# Patient Record
Sex: Female | Born: 1955 | Race: White | Hispanic: No | Marital: Married | Smoking: Former smoker
Health system: Southern US, Community
[De-identification: ages and names within clinical notes are randomized; demographics above are authoritative.]

## PROBLEM LIST (undated history)

## (undated) DIAGNOSIS — E669 Obesity, unspecified: Secondary | ICD-10-CM

## (undated) DIAGNOSIS — J449 Chronic obstructive pulmonary disease, unspecified: Secondary | ICD-10-CM

## (undated) DIAGNOSIS — D696 Thrombocytopenia, unspecified: Secondary | ICD-10-CM

## (undated) DIAGNOSIS — K08109 Complete loss of teeth, unspecified cause, unspecified class: Secondary | ICD-10-CM

## (undated) DIAGNOSIS — D751 Secondary polycythemia: Secondary | ICD-10-CM

## (undated) DIAGNOSIS — Z720 Tobacco use: Secondary | ICD-10-CM

## (undated) DIAGNOSIS — K5720 Diverticulitis of large intestine with perforation and abscess without bleeding: Secondary | ICD-10-CM

## (undated) DIAGNOSIS — E119 Type 2 diabetes mellitus without complications: Secondary | ICD-10-CM

## (undated) HISTORY — PX: OTHER SURGICAL HISTORY: SHX169

## (undated) HISTORY — PX: TONSILLECTOMY: SUR1361

---

## 2003-11-22 ENCOUNTER — Ambulatory Visit (HOSPITAL_COMMUNITY): Admission: RE | Admit: 2003-11-22 | Discharge: 2003-11-22 | Payer: Self-pay | Admitting: Orthopedic Surgery

## 2005-05-18 IMAGING — CR DG CHEST 2V
2 series · 2 of 2 positions shown · non-contrast
Comparison: none

CLINICAL DATA: Index finger laceration.  Pre-operative chest. 
 TWO VIEW CHEST ? 11/21/03
 The lungs are clear and the heart and mediastinal structures are normal.  
 IMPRESSION
 No evidence for active chest disease.

[view not recorded (1 of 2)]
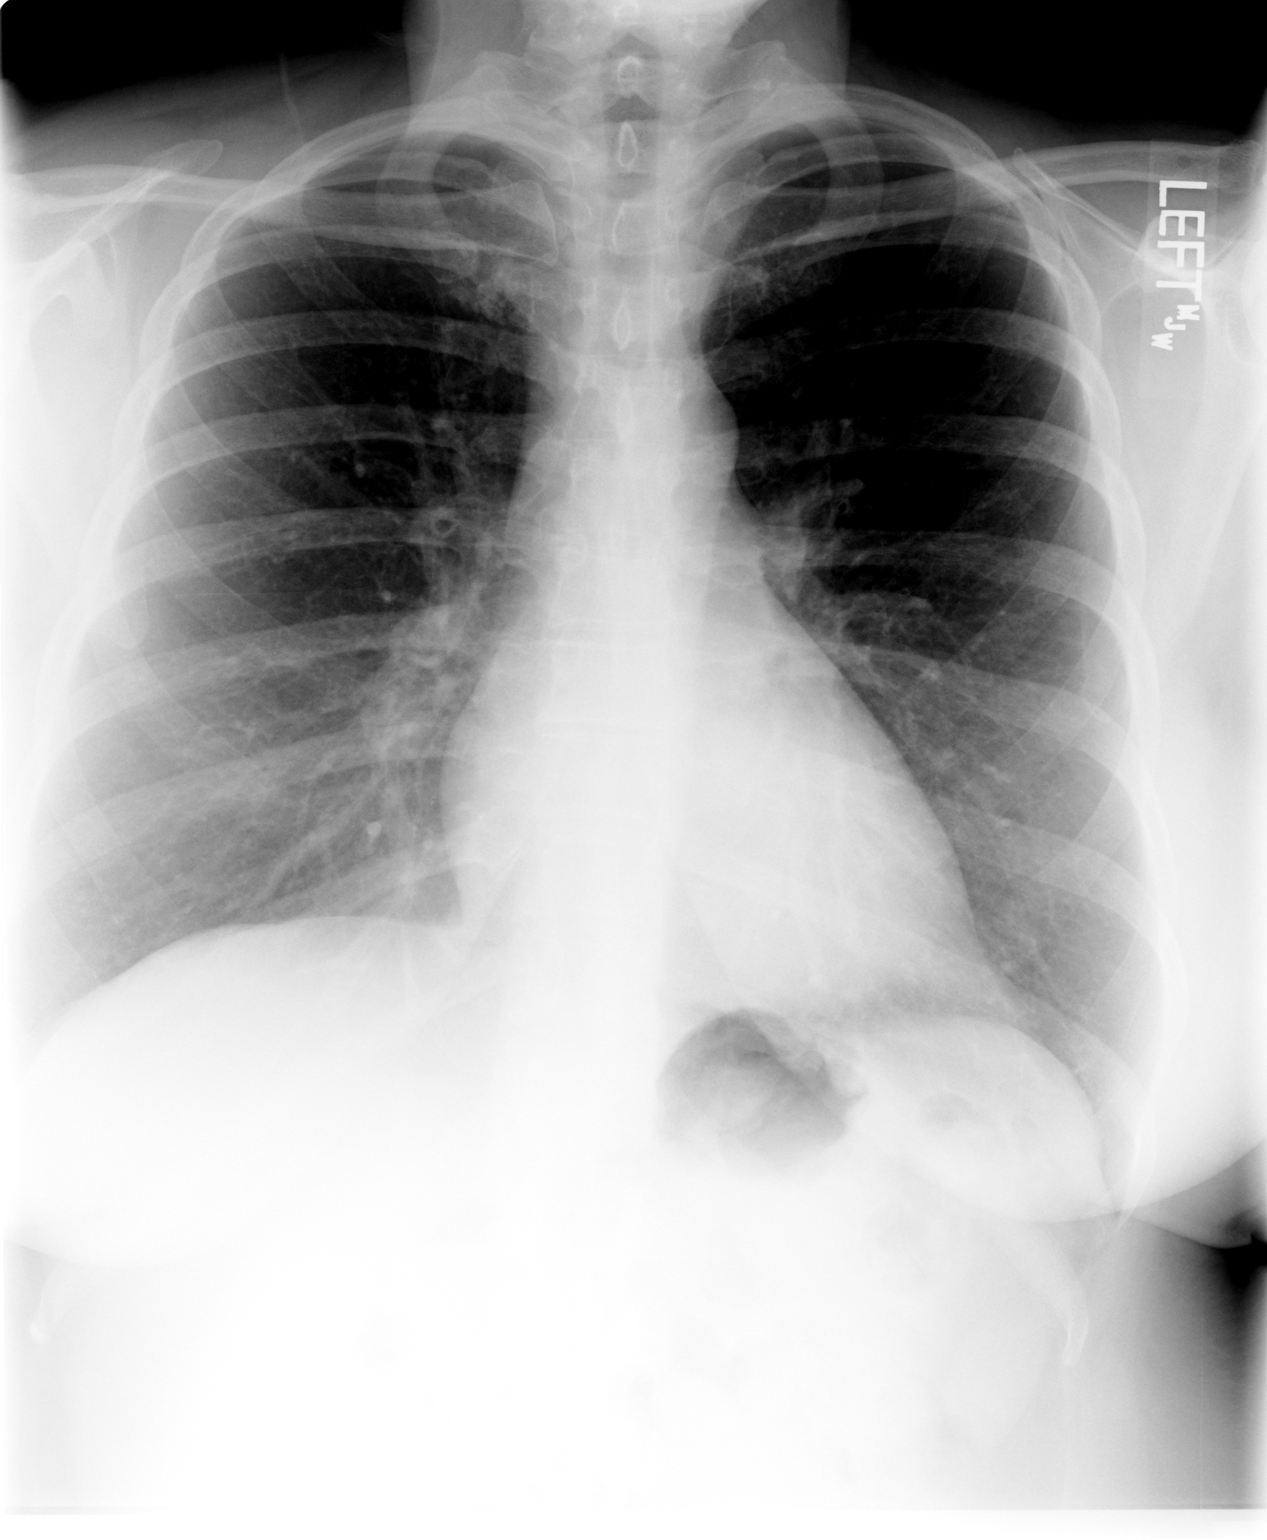

[view not recorded (2 of 2)]
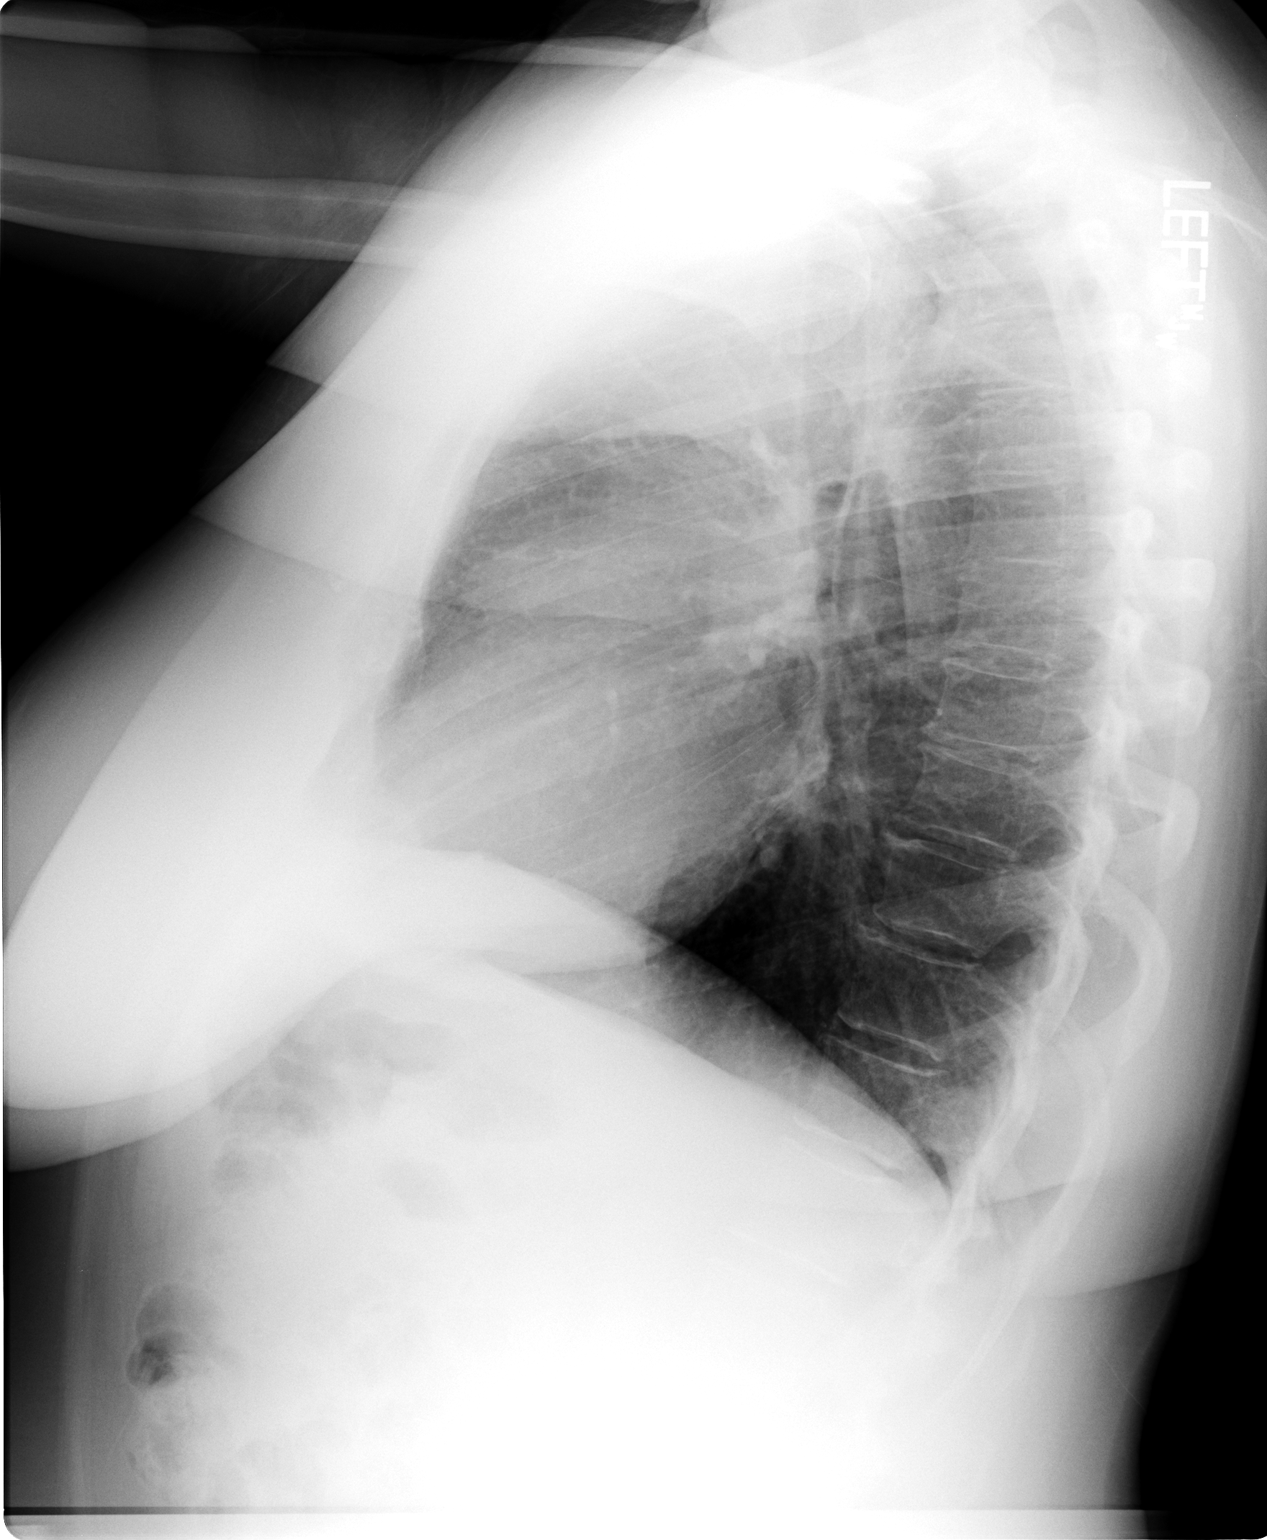

[2 of 2 positions shown; findings below may reference images not displayed]

## 2019-12-18 ENCOUNTER — Encounter: Payer: Self-pay | Admitting: Emergency Medicine

## 2019-12-18 ENCOUNTER — Ambulatory Visit (INDEPENDENT_AMBULATORY_CARE_PROVIDER_SITE_OTHER): Payer: BC Managed Care – PPO

## 2019-12-18 ENCOUNTER — Ambulatory Visit
Admission: EM | Admit: 2019-12-18 | Discharge: 2019-12-18 | Disposition: A | Discharge status: Home / Self Care | Payer: BC Managed Care – PPO | Attending: Family Medicine | Admitting: Family Medicine

## 2019-12-18 ENCOUNTER — Other Ambulatory Visit: Payer: Self-pay

## 2019-12-18 DIAGNOSIS — S90122A Contusion of left lesser toe(s) without damage to nail, initial encounter: Secondary | ICD-10-CM

## 2019-12-18 MED ORDER — NAPROXEN 500 MG PO TABS: 500.0000 mg | ORAL_TABLET | Freq: Two times a day (BID) | ORAL | 0 refills | Status: DC | PRN

## 2019-12-18 NOTE — Discharge Instructions (Signed)
Rest, ice.  Medication as needed for pain.  Take care  Dr. Adriana Simas

## 2019-12-18 NOTE — ED Triage Notes (Signed)
Patient c/o redness, swelling and pain in her left 5th toe for the past 3-4 days.  Patient denies fevers.

## 2019-12-18 NOTE — ED Provider Notes (Signed)
[facility]    CSN: [id number] Arrival date & time: 12/18/19  2355      History   Chief Complaint Chief Complaint  Patient presents with  . Toe Pain   HPI  64 year old female presents with pain and swelling of her left fifth toe.  Patient reports pain for the past 3 to 4 days.  She states that prior to the development of pain she trimmed her toenail.  Patient dorsums redness, swelling, and pain.  Patient is concerned that she has an infection of the toe.  No fever.  No relieving factors.  The area is quite painful.  Pain 7/10 in severity.  Worse with pressure to the affected area.  No other complaints.  PMH: Tobacco abuse  Past Surgical History:  Procedure Laterality Date  . CESAREAN SECTION    . TONSILLECTOMY      OB History   No obstetric history on file.      Home Medications    Prior to Admission medications   Medication Sig Start Date End Date Taking? Authorizing Provider  naproxen (NAPROSYN) 500 MG tablet Take 1 tablet (500 mg total) by mouth 2 (two) times daily as needed for mild pain or moderate pain. 12/18/19   Coral Spikes, DO    Family History Family History  Problem Relation Age of Onset  . Asthma Mother   . Heart attack Mother   . Heart attack Father     Social History Social History   Tobacco Use  . Smoking status: Current Every Day Smoker    Types: Cigarettes  . Smokeless tobacco: Never Used  Substance Use Topics  . Alcohol use: Not Currently  . Drug use: Never     Allergies   Patient has no known allergies.   Review of Systems Review of Systems  Constitutional: Negative.   Musculoskeletal:       Left 5th toe pain.   Physical Exam Triage Vital Signs ED Triage Vitals  Enc Vitals Group     BP 12/18/19 0917 108/80     Pulse Rate 12/18/19 0917 87     Resp 12/18/19 0917 14     Temp 12/18/19 0917 97.9 F (36.6 C)     Temp Source 12/18/19 0917 Oral     SpO2 12/18/19 0917 93 %     Weight 12/18/19 0914 215 lb (97.5  kg)     Height 12/18/19 0914 5\' 3"  (1.6 m)     Head Circumference --      Peak Flow --      Pain Score 12/18/19 0914 7     Pain Loc --      Pain Edu? --      Excl. in [place]? --    Updated Vital Signs BP 108/80 (BP Location: Left Arm)   Pulse 87   Temp 97.9 F (36.6 C) (Oral)   Resp 14   Ht 5\' 3"  (1.6 m)   Wt 97.5 kg   SpO2 93%   BMI 38.09 kg/m   Visual Acuity Right Eye Distance:   Left Eye Distance:   Bilateral Distance:    Right Eye Near:   Left Eye Near:    Bilateral Near:     Physical Exam Vitals and nursing note reviewed.  Constitutional:      General: She is not in acute distress.    Appearance: Normal appearance. She is obese. She is not ill-appearing.  HENT:     Head: Normocephalic and atraumatic.  Eyes:  General:        Right eye: No discharge.        Left eye: No discharge.     Conjunctiva/sclera: Conjunctivae normal.  Cardiovascular:     Pulses:          Dorsalis pedis pulses are detected w/ Doppler on the left side.  Pulmonary:     Effort: Pulmonary effort is normal. No respiratory distress.  Feet:     Left foot:     Skin integrity: Skin integrity normal.     Comments: Left fifth toe with extensive bruising on the plantar aspect.  Tender to palpation.  No appreciable erythema or warmth. Neurological:     Mental Status: She is alert.  Psychiatric:        Mood and Affect: Mood normal.        Behavior: Behavior normal.    UC Treatments / Results  Labs (all labs ordered are listed, but only abnormal results are displayed) Labs Reviewed - No data to display  EKG   Radiology DG Toe 5th Left  Result Date: 12/18/2019 CLINICAL DATA:  Pain with swelling and redness EXAM: DG TOE 5TH LEFT: 3 V COMPARISON:  None. FINDINGS: Frontal, oblique, and lateral views obtained. No fracture or dislocation. Joint spaces appear normal. No erosive change. IMPRESSION: No fracture or dislocation.  No evident arthropathy. Electronically Signed   By: Bretta Bang  III M.D.   On: 12/18/2019 10:07    Procedures Procedures (including critical care time)  Medications Ordered in UC Medications - No data to display  Initial Impression / Assessment and Plan / UC Course  I have reviewed the triage vital signs and the nursing notes.  Pertinent labs & imaging results that were available during my care of the patient were reviewed by me and considered in my medical decision making (see chart for details).    64 year old female presents with contusion of her left fifth toe.  X-ray obtained and interpreted independently by me.  Interpretation: No acute fracture.  Advised rest, ice.  Naproxen as directed.   Final Clinical Impressions(s) / UC Diagnoses   Final diagnoses:  Contusion of lesser toe of left foot without damage to nail, initial encounter     Discharge Instructions     Rest, ice.  Medication as needed for pain.  Take care  Dr. Adriana Simas    ED Prescriptions    Medication Sig Dispense Auth. Provider   naproxen (NAPROSYN) 500 MG tablet Take 1 tablet (500 mg total) by mouth 2 (two) times daily as needed for mild pain or moderate pain. 30 tablet Tommie Sams, DO     PDMP not reviewed this encounter.   Tommie Sams, [place] 12/18/19 [phone]

## 2021-06-14 IMAGING — CR DG TOE 5TH 2+V*L*
3 series · 3 of 3 positions shown · non-contrast
Comparison: None.

CLINICAL DATA: Pain with swelling and redness

EXAM:
DG TOE 5TH LEFT: 3 V

[toe ap]
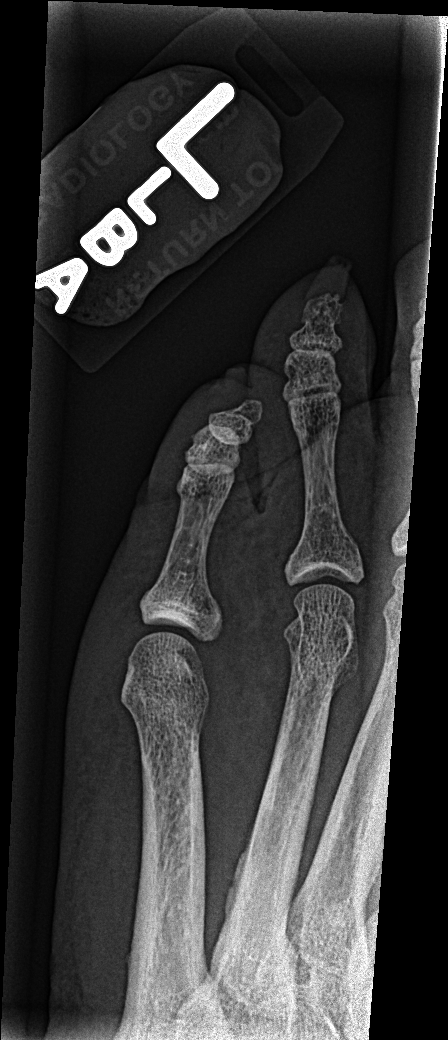

[toe obl]
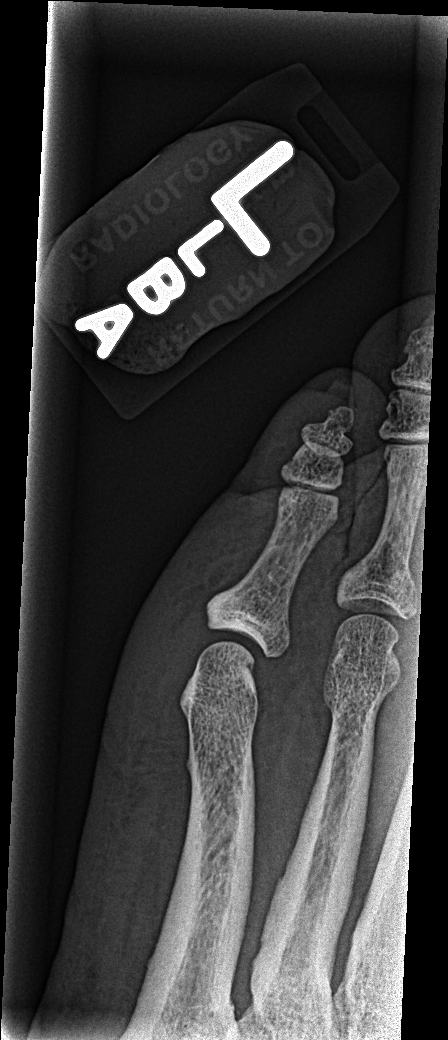

[toe lat]
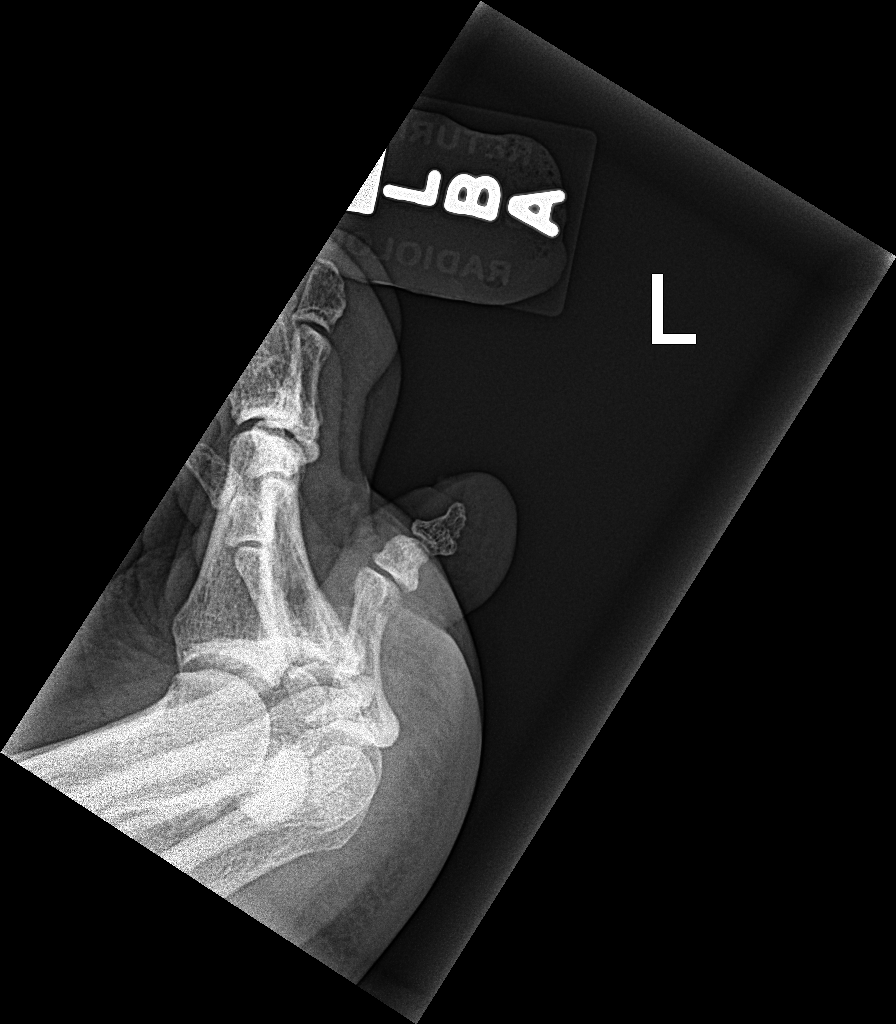

[3 of 3 positions shown; findings below may reference images not displayed]

FINDINGS: Frontal, oblique, and lateral views obtained. No fracture or
dislocation. Joint spaces appear normal. No erosive change.
IMPRESSION: No fracture or dislocation.  No evident arthropathy.

## 2021-09-22 ENCOUNTER — Encounter: Payer: Self-pay | Admitting: Emergency Medicine

## 2021-09-22 ENCOUNTER — Inpatient Hospital Stay
Admission: EM | Admit: 2021-09-22 | Discharge: 2021-09-24 | DRG: 190 | Disposition: A | Discharge status: Home / Self Care | Payer: BLUE CROSS/BLUE SHIELD | Attending: Internal Medicine | Admitting: Internal Medicine

## 2021-09-22 ENCOUNTER — Emergency Department: Payer: Self-pay

## 2021-09-22 ENCOUNTER — Other Ambulatory Visit: Payer: Self-pay

## 2021-09-22 DIAGNOSIS — Z6835 Body mass index (BMI) 35.0-35.9, adult: Secondary | ICD-10-CM

## 2021-09-22 DIAGNOSIS — J9602 Acute respiratory failure with hypercapnia: Secondary | ICD-10-CM | POA: Diagnosis present

## 2021-09-22 DIAGNOSIS — D751 Secondary polycythemia: Secondary | ICD-10-CM | POA: Diagnosis present

## 2021-09-22 DIAGNOSIS — Z8249 Family history of ischemic heart disease and other diseases of the circulatory system: Secondary | ICD-10-CM

## 2021-09-22 DIAGNOSIS — Z5989 Other problems related to housing and economic circumstances: Secondary | ICD-10-CM

## 2021-09-22 DIAGNOSIS — Z720 Tobacco use: Secondary | ICD-10-CM | POA: Diagnosis present

## 2021-09-22 DIAGNOSIS — F17210 Nicotine dependence, cigarettes, uncomplicated: Secondary | ICD-10-CM | POA: Diagnosis present

## 2021-09-22 DIAGNOSIS — R0602 Shortness of breath: Secondary | ICD-10-CM | POA: Diagnosis present

## 2021-09-22 DIAGNOSIS — Z5971 Insufficient health insurance coverage: Secondary | ICD-10-CM

## 2021-09-22 DIAGNOSIS — Z825 Family history of asthma and other chronic lower respiratory diseases: Secondary | ICD-10-CM

## 2021-09-22 DIAGNOSIS — B9789 Other viral agents as the cause of diseases classified elsewhere: Secondary | ICD-10-CM | POA: Diagnosis present

## 2021-09-22 DIAGNOSIS — E669 Obesity, unspecified: Secondary | ICD-10-CM

## 2021-09-22 DIAGNOSIS — J441 Chronic obstructive pulmonary disease with (acute) exacerbation: Secondary | ICD-10-CM | POA: Diagnosis not present

## 2021-09-22 DIAGNOSIS — Z20822 Contact with and (suspected) exposure to covid-19: Secondary | ICD-10-CM | POA: Diagnosis present

## 2021-09-22 DIAGNOSIS — J9601 Acute respiratory failure with hypoxia: Secondary | ICD-10-CM

## 2021-09-22 DIAGNOSIS — D696 Thrombocytopenia, unspecified: Secondary | ICD-10-CM | POA: Diagnosis present

## 2021-09-22 DIAGNOSIS — J449 Chronic obstructive pulmonary disease, unspecified: Secondary | ICD-10-CM | POA: Diagnosis present

## 2021-09-22 HISTORY — DX: Obesity, unspecified: E66.9

## 2021-09-22 HISTORY — DX: Tobacco use: Z72.0

## 2021-09-22 LAB — CBC WITH DIFFERENTIAL/PLATELET
Abs Immature Granulocytes: 0.03 10*3/uL (ref 0.00–0.07)
Basophils Absolute: 0 10*3/uL (ref 0.0–0.1)
Basophils Relative: 1 %
Eosinophils Absolute: 0.1 10*3/uL (ref 0.0–0.5)
Eosinophils Relative: 2 %
HCT: 49.6 % — ABNORMAL HIGH (ref 36.0–46.0)
Hemoglobin: 15.3 g/dL — ABNORMAL HIGH (ref 12.0–15.0)
Immature Granulocytes: 0 %
Lymphocytes Relative: 16 %
Lymphs Abs: 1.4 10*3/uL (ref 0.7–4.0)
MCH: 28.9 pg (ref 26.0–34.0)
MCHC: 30.8 g/dL (ref 30.0–36.0)
MCV: 93.6 fL (ref 80.0–100.0)
Monocytes Absolute: 0.6 10*3/uL (ref 0.1–1.0)
Monocytes Relative: 7 %
Neutro Abs: 6.2 10*3/uL (ref 1.7–7.7)
Neutrophils Relative %: 74 %
Platelets: 149 10*3/uL — ABNORMAL LOW (ref 150–400)
RBC: 5.3 MIL/uL — ABNORMAL HIGH (ref 3.87–5.11)
RDW: 14.6 % (ref 11.5–15.5)
WBC: 8.3 10*3/uL (ref 4.0–10.5)
nRBC: 0 % (ref 0.0–0.2)

## 2021-09-22 LAB — RESP PANEL BY RT-PCR (FLU A&B, COVID) ARPGX2
Influenza A by PCR: NEGATIVE
Influenza B by PCR: NEGATIVE
SARS Coronavirus 2 by RT PCR: NEGATIVE

## 2021-09-22 LAB — BLOOD GAS, VENOUS
Acid-Base Excess: 7 mmol/L — ABNORMAL HIGH (ref 0.0–2.0)
Bicarbonate: 35.3 mmol/L — ABNORMAL HIGH (ref 20.0–28.0)
O2 Saturation: 84.5 %
Patient temperature: 37
pCO2, Ven: 64 mmHg — ABNORMAL HIGH (ref 44.0–60.0)
pH, Ven: 7.35 (ref 7.250–7.430)
pO2, Ven: 52 mmHg — ABNORMAL HIGH (ref 32.0–45.0)

## 2021-09-22 LAB — TROPONIN I (HIGH SENSITIVITY): Troponin I (High Sensitivity): 14 ng/L (ref <18)

## 2021-09-22 LAB — PROTIME-INR
INR: 1 (ref 0.8–1.2)
Prothrombin Time: 13.2 seconds (ref 11.4–15.2)

## 2021-09-22 LAB — BASIC METABOLIC PANEL WITH GFR
Anion gap: 7 (ref 5–15)
BUN: 10 mg/dL (ref 8–23)
CO2: 30 mmol/L (ref 22–32)
Calcium: 8.3 mg/dL — ABNORMAL LOW (ref 8.9–10.3)
Chloride: 98 mmol/L (ref 98–111)
Creatinine, Ser: 0.71 mg/dL (ref 0.44–1.00)
GFR, Estimated: >60 mL/min
Glucose, Bld: 149 mg/dL — ABNORMAL HIGH (ref 70–99)
Potassium: 4.8 mmol/L (ref 3.5–5.1)
Sodium: 135 mmol/L (ref 135–145)

## 2021-09-22 LAB — D-DIMER, QUANTITATIVE: D-Dimer, Quant: 1.44 ug/mL-FEU — ABNORMAL HIGH (ref 0.00–0.50)

## 2021-09-22 LAB — BRAIN NATRIURETIC PEPTIDE: B Natriuretic Peptide: 139.4 pg/mL — ABNORMAL HIGH (ref 0.0–100.0)

## 2021-09-22 MED ORDER — SODIUM CHLORIDE 0.9% FLUSH
3.0000 mL | Freq: Two times a day (BID) | INTRAVENOUS | Status: DC
  Administered 2021-09-22 – 2021-09-24 (×4): 3 mL via INTRAVENOUS

## 2021-09-22 MED ORDER — PREDNISONE 20 MG PO TABS
40.0000 mg | ORAL_TABLET | Freq: Every day | ORAL | Status: DC
  Administered 2021-09-23 – 2021-09-24 (×2): 40 mg via ORAL
  Filled 2021-09-22 (×2): qty 2

## 2021-09-22 MED ORDER — METHYLPREDNISOLONE SODIUM SUCC 125 MG IJ SOLR
125.0000 mg | INTRAMUSCULAR | Status: AC
  Administered 2021-09-22: 125 mg via INTRAVENOUS
  Filled 2021-09-22: qty 2

## 2021-09-22 MED ORDER — LORATADINE 10 MG PO TABS: 10.0000 mg | ORAL_TABLET | Freq: Every day | ORAL | Status: DC | PRN

## 2021-09-22 MED ORDER — SODIUM CHLORIDE 0.9 % IV BOLUS
500.0000 mL | Freq: Once | INTRAVENOUS | Status: AC
  Administered 2021-09-22: 500 mL via INTRAVENOUS

## 2021-09-22 MED ORDER — ALBUTEROL SULFATE (2.5 MG/3ML) 0.083% IN NEBU: 2.5000 mg | INHALATION_SOLUTION | RESPIRATORY_TRACT | Status: DC | PRN

## 2021-09-22 MED ORDER — GUAIFENESIN ER 600 MG PO TB12
600.0000 mg | ORAL_TABLET | Freq: Two times a day (BID) | ORAL | Status: DC
  Administered 2021-09-22 – 2021-09-24 (×4): 600 mg via ORAL
  Filled 2021-09-22 (×3): qty 1

## 2021-09-22 MED ORDER — LEVOFLOXACIN IN D5W 750 MG/150ML IV SOLN
750.0000 mg | Freq: Once | INTRAVENOUS | Status: DC
  Administered 2021-09-22: 750 mg via INTRAVENOUS
  Filled 2021-09-22: qty 150

## 2021-09-22 MED ORDER — SODIUM CHLORIDE 0.9 % IV SOLN
500.0000 mg | INTRAVENOUS | Status: DC
  Filled 2021-09-22: qty 5

## 2021-09-22 MED ORDER — IPRATROPIUM-ALBUTEROL 0.5-2.5 (3) MG/3ML IN SOLN
3.0000 mL | Freq: Four times a day (QID) | RESPIRATORY_TRACT | Status: DC
  Administered 2021-09-22 – 2021-09-23 (×4): 3 mL via RESPIRATORY_TRACT
  Filled 2021-09-22 (×4): qty 3

## 2021-09-22 MED ORDER — IPRATROPIUM-ALBUTEROL 0.5-2.5 (3) MG/3ML IN SOLN
9.0000 mL | Freq: Once | RESPIRATORY_TRACT | Status: AC
  Administered 2021-09-22: 9 mL via RESPIRATORY_TRACT
  Filled 2021-09-22: qty 9

## 2021-09-22 MED ORDER — AZITHROMYCIN 250 MG PO TABS: 500.0000 mg | ORAL_TABLET | Freq: Every day | ORAL | Status: DC

## 2021-09-22 NOTE — Plan of Care (Signed)

## 2021-09-22 NOTE — Assessment & Plan Note (Addendum)
Patient reports that she has at least a 50 smoking pack-year history.  For the last week she has not smoked any cigarettes and declines need of a nicotine patch. -Encouraged continued cessation of tobacco use -Patient likely needs to have a screening CT scan of the chest given her history.

## 2021-09-22 NOTE — Assessment & Plan Note (Addendum)
Patient presented with complaints of shortness of breath and was found to be hypoxic 70% on room air requiring placement to a nonrebreather with improvement in O2 saturations greater than 92%.  Venous blood gas noted hypercapnia with PCO2 of  64.  Patient has been given Solu-Medrol 125 mg IV and DuoNeb breathing treatments with improvement in symptoms and was able to be weaned down to 6 L with O2 saturations maintained.  Chest x-ray had not shown any significant abnormality.  Given her history of tobacco abuse with suspect the patient likely has COPD although not formally diagnosed. -Admit to a progressive bed -COPD order set utilized -Continuous pulse oximetry with nasal cannula oxygen maintain O2 saturation greater than 92% -Check D-dimer, but lower suspicion for pulmonary embolus at this time. -Breathing treatments scheduled and as needed -Empiric antibiotics of azithromycin -Transition to prednisone 40 mg daily in a.m., but may need to continue with IV Solu-Medrol depending on how the patient is doing clinically

## 2021-09-22 NOTE — Assessment & Plan Note (Signed)
-  Transitions of care consulted

## 2021-09-22 NOTE — ED Triage Notes (Signed)
Pt via POV from home. Pt states she has been SOB for the past 2 days. States she has been getting over a common cold. Denies hx of COPD/CHF but is a smoker. Denies pain. N arrival, pt 70% on RA. Pt placed on NRB at this time 15L increased to 95%.   Pt roomed to 16. Dr. Joni Fears at bedside

## 2021-09-22 NOTE — Assessment & Plan Note (Addendum)
On admission hemoglobin 15.3.  Suspect related with patient's long history of tobacco abuse.

## 2021-09-22 NOTE — ED Provider Notes (Signed)
[facility] Provider Note    Event Date/Time   First MD Initiated Contact with Patient 09/22/21 1443     (approximate)   History   Respiratory Distress   HPI  Yolanda Gray is a 66 y.o. female with no known medical history who comes ED complaining of shortness of breath for the past 2 days, gradual onset and worsening.  At triage she was noted to have a room air oxygen saturation of 70%, placed on nonrebreather and brought back to treatment room.  Patient states that she was in her usual state of health up until a week ago when she had nonproductive cough, nasal congestion, headache and body aches and fatigue.  Many of those symptoms have now resolved, but this was followed by the worsening shortness of breath.  Denies chest pain.     Physical Exam   Triage Vital Signs: ED Triage Vitals  Enc Vitals Group     BP 09/22/21 1442 (!) 157/70     Pulse Rate 09/22/21 1437 (!) 115     Resp 09/22/21 1437 (!) 24     Temp 09/22/21 1437 98.4 F (36.9 C)     Temp Source 09/22/21 1437 Oral     SpO2 09/22/21 1437 (!) 70 %     Weight 09/22/21 1438 200 lb (90.7 kg)     Height 09/22/21 1438 5\' 3"  (1.6 m)     Head Circumference --      Peak Flow --      Pain Score 09/22/21 1438 0     Pain Loc --      Pain Edu? --      Excl. in [place]? --     Most recent vital signs: Vitals:   09/22/21 1500 09/22/21 1530  BP: (!) 119/55 (!) 114/48  Pulse: (!) 107 93  Resp: (!) 22 19  Temp:    SpO2: 99% 99%     General: Awake, no distress.  Mild respiratory distress CV:  Good peripheral perfusion.  Tachycardia heart rate 110 Resp:  Normal effort.  Tachypnea, respiratory rate of 24.  Diffuse expiratory wheezing with prolonged expiratory phase.  FEV1 maneuver accentuates wheezing and provokes coughing. Abd:  No distention.  Soft nontender Other:  No lower extremity edema.   ED Results / Procedures / Treatments   Labs (all labs ordered are listed, but only abnormal results  are displayed) Labs Reviewed  BASIC METABOLIC PANEL - Abnormal; Notable for the following components:      Result Value   Glucose, Bld 149 (*)    Calcium 8.3 (*)    All other components within normal limits  BRAIN NATRIURETIC PEPTIDE - Abnormal; Notable for the following components:   B Natriuretic Peptide 139.4 (*)    All other components within normal limits  CBC WITH DIFFERENTIAL/PLATELET - Abnormal; Notable for the following components:   RBC 5.30 (*)    Hemoglobin 15.3 (*)    HCT 49.6 (*)    Platelets 149 (*)    All other components within normal limits  BLOOD GAS, VENOUS - Abnormal; Notable for the following components:   pCO2, Ven 64 (*)    pO2, Ven 52.0 (*)    Bicarbonate 35.3 (*)    Acid-Base Excess 7.0 (*)    All other components within normal limits  RESP PANEL BY RT-PCR (FLU A&B, COVID) ARPGX2  PROTIME-INR  TROPONIN I (HIGH SENSITIVITY)     EKG Interpreted by me Sinus tachycardia rate 107.  Normal axis and intervals.  Normal QRS ST segments and T waves    RADIOLOGY Chest x-ray viewed and interpreted by me, appears unremarkable without pulmonary edema or infiltrate.  Radiology report reviewed    PROCEDURES:  Critical Care performed: Yes, see critical care procedure note(s)  .1-3 Lead EKG Interpretation Performed by: Sharman Cheek, MD Authorized by: Sharman Cheek, MD     Interpretation: abnormal     ECG rate:  110   ECG rate assessment: tachycardic     Rhythm: sinus rhythm     Ectopy: none     Conduction: normal   Comments:        .Critical Care Performed by: Sharman Cheek, MD Authorized by: Sharman Cheek, MD   Critical care provider statement:    Critical care time (minutes):  32   Critical care time was exclusive of:  Separately billable procedures and treating other patients   Critical care was necessary to treat or prevent imminent or life-threatening deterioration of the following conditions:  Respiratory failure    Critical care was time spent personally by me on the following activities:  Development of treatment plan with patient or surrogate, discussions with consultants, evaluation of patient's response to treatment, examination of patient, obtaining history from patient or surrogate, ordering and performing treatments and interventions, ordering and review of laboratory studies, ordering and review of radiographic studies, pulse oximetry, re-evaluation of patient's condition and review of old charts   MEDICATIONS ORDERED IN ED: Medications  levofloxacin (LEVAQUIN) IVPB 750 mg (750 mg Intravenous New Bag/Given 09/22/21 1511)  sodium chloride 0.9 % bolus 500 mL (500 mLs Intravenous New Bag/Given 09/22/21 1501)  methylPREDNISolone sodium succinate (SOLU-MEDROL) 125 mg/2 mL injection 125 mg (125 mg Intravenous Given 09/22/21 1506)  ipratropium-albuterol (DUONEB) 0.5-2.5 (3) MG/3ML nebulizer solution 9 mL (9 mLs Nebulization Given 09/22/21 1512)     IMPRESSION / MDM / ASSESSMENT AND PLAN / ED COURSE  I reviewed the triage vital signs and the nursing notes.                              Differential diagnosis includes, but is not limited to, bronchospasm/exacerbation of undiagnosed COPD, acute pulmonary edema, pneumonia, viral illness  The patient is on the cardiac monitor to evaluate for evidence of arrhythmia and/or significant heart rate changes.**}  Patient presents with shortness of breath, found to be in acute respiratory failure with hypoxia.  She is requiring nonrebreather oxygen to maintain oxygen saturation in the mid 90s.  She does feel better on nonrebreather.  No chest pain currently.  She is a daily smoker.  Given her pronounced wheezing and prolonged expiratory phase on exam, I think that most likely this is a COPD exacerbation even though she does not have a known diagnosis of COPD.  Doubt ACS PE or dissection.  Will give Solu-Medrol, bronchodilators, fluids, check labs chest x-ray and COVID  test.  Will most likely require admission for further management unless work-up is reassuring and hypoxia resolves after initial medications. Clinical Course as of 09/22/21 1556  Sat Sep 22, 2021  1507 SpO2(!): 70 % [KP]    Clinical Course User Index [KP] Minna Antis, MD    ----------------------------------------- 3:56 PM on 09/22/2021 ----------------------------------------- After nebs, titrated down to 6L [place]. Cxr clear. Will plan to admit.    FINAL CLINICAL IMPRESSION(S) / ED DIAGNOSES   Final diagnoses:  Acute respiratory failure with hypoxia (HCC)     Rx /  DC Orders   ED Discharge Orders     None        Note:  This document was prepared using Dragon voice recognition software and may include unintentional dictation errors.   Sharman Cheek, MD 09/22/21 [phone]

## 2021-09-22 NOTE — H&P (Signed)
History and Physical    Patient: Yolanda Gray RAX:[id number] DOB: 1955-12-16 DOA: 09/22/2021 DOS: the patient was seen and examined on 09/22/2021 PCP: Pcp, No  Patient coming from: Home via private vehicle  Chief Complaint:  Chief Complaint  Patient presents with   Respiratory Distress    HPI: Yolanda Gray is a 66 y.o. female with medical history significant of tobacco abuse who presents with complaints of worsening shortness of breath for the last 2 to 3 days.  Her husband was sick initially first and likely gave it to her about a week ago.  Symptoms initially started with "head and chest cold".  She reports having significant nasal congestion with intermittently productive cough.  She had been alternating BC Goody powders and ibuprofen to treat symptoms and never took more than 1 or 2 in a day.  She noted associated symptoms of headache, wheezing, toe cramping, and fatigue recently.  Last couple days she got to the point where she was not able to get up and around without feeling significantly short of breath despite some of the other symptoms improving.  Due to her issues with breathing she has stopped smoking cigarettes altogether.  Patient reports that she started smoking at the age of 43 and on average over these 50 years that smoked anywhere from a 0.5-2 packs/day. She denies having any significant fever, chest pain, nausea, vomiting, diarrhea, leg pain, leg swelling, history of blood clots, or prolonged immobilization.  She does not have a primary care provider at this time as she could no longer afford her insurance.  She had [employer] and [employer] until the beginning of this year, but have been sent a $1500 bill for which she could not pay.  Upon admission to the emergency department patient was noted to be tachycardic and tachypneic with O2 saturations as low as 70% on room air with improvement on nonrebreather at 10 L.  Labs significant for hemoglobin 15.3, platelets 149, BNP 139.4,  and high-sensitivity troponin negative.  Venous blood gas was obtained which noted pH 7.35, PCO2 64,  and PO2 52.  She was given 500 mL of normal saline IV fluids, Solu-Medrol and 25 mg IV, and DuoNeb breathing treatment.  Patient was able to be weaned down from nonrebreather to 6 L nasal cannula oxygen.  Review of Systems: As mentioned in the history of present illness. All other systems reviewed and are negative. History reviewed. No pertinent past medical history. Past Surgical History:  Procedure Laterality Date   CESAREAN SECTION     TONSILLECTOMY     Social History:  reports that she has been smoking cigarettes. She has never used smokeless tobacco. She reports that she does not currently use alcohol. She reports that she does not use drugs.  Allergies  Allergen Reactions   Pork-Derived Products     Family History  Problem Relation Age of Onset   Asthma Mother    Heart attack Mother    Heart attack Father     Prior to Admission medications   Medication Sig Start Date End Date Taking? Authorizing Provider  Aspirin-Acetaminophen-Caffeine 500-500-65 MG PACK Take 1 packet by mouth daily as needed.   Yes [provider]  naproxen (NAPROSYN) 500 MG tablet Take 1 tablet (500 mg total) by mouth 2 (two) times daily as needed for mild pain or moderate pain. 12/18/19   Tommie Sams, DO    Physical Exam: Vitals:   09/22/21 1445 09/22/21 1500 09/22/21 1530 09/22/21 1600  BP:  (!) 119/55 (!) 114/48 (!) 128/53  Pulse: (!) 110 (!) 107 93 90  Resp: 18 (!) 22 19 (!) 26  Temp:      TempSrc:      SpO2: 99% 99% 99% 92%  Weight:      Height:       Exam  Constitutional: Older female who appears to be in some respiratory distress Eyes: PERRL, lids and conjunctivae normal ENMT: Mucous membranes are moist.   Neck: normal, supple, no masses, no thyromegaly Respiratory: Mildly tachypneic with expiratory wheezes appreciated.  Patient currently on 6 L of nasal cannula oxygen with O2  saturations maintained. Cardiovascular: Tachycardic without significant murmur appreciated. No extremity edema. 2+ pedal pulses.   Abdomen: no tenderness, no masses palpated. Bowel sounds positive in all 4 quadrant.  Musculoskeletal: no clubbing / cyanosis. No joint deformity upper and lower extremities.  Skin: no rashes, lesions, ulcers. No induration Neurologic: CN 2-12 grossly intact. Strength 5/5 in all 4.  Psychiatric: Normal judgment and insight. Alert and oriented x 3. Normal mood.    Data Reviewed:  Reviewed VBG noting mild hypercapnia.  Labs noted polycythemia 15.3 and thrombocytopenia with platelet count 149.  No reports of bleeding.  Assessment and Plan: Acute respiratory failure with hypoxia and hypercapnia (HCC) secondary to COPD exacerbation- (present on admission) Patient presented with complaints of shortness of breath and was found to be hypoxic 70% on room air requiring placement to a nonrebreather with improvement in O2 saturations greater than 92%.  Venous blood gas noted hypercapnia with PCO2 of  64.  Patient has been given Solu-Medrol 125 mg IV and DuoNeb breathing treatments with improvement in symptoms and was able to be weaned down to 6 L with O2 saturations maintained.  Chest x-ray had not shown any significant abnormality.  Given her history of tobacco abuse with suspect the patient likely has COPD although not formally diagnosed. -Admit to a progressive bed -COPD order set utilized -Continuous pulse oximetry with nasal cannula oxygen maintain O2 saturation greater than 92% -Check D-dimer, but lower suspicion for pulmonary embolus at this time. -Breathing treatments scheduled and as needed -Empiric antibiotics of azithromycin -Transition to prednisone 40 mg daily in a.m., but may need to continue with IV Solu-Medrol depending on how the patient is doing clinically   Tobacco abuse- (present on admission) Patient reports that she has at least a 50 smoking pack-year  history.  For the last week she has not smoked any cigarettes and declines need of a nicotine patch. -Encouraged continued cessation of tobacco use -Patient likely needs to have a screening CT scan of the chest given her history.    Thrombocytopenia (HCC)- (present on admission) Platelet count on admission 149. -Continue to monitor  Polycythemia- (present on admission) On admission hemoglobin 15.3.  Suspect related with patient's long history of tobacco abuse.  Obesity (BMI 30-39.9) BMI 35.43 kg/m  Does not have health insurance -Transitions of care consulted       Advance Care Planning:   Code Status: Full Code   Consults: None  Family Communication: None requested  Severity of Illness: The appropriate patient status for this patient is INPATIENT. Inpatient status is judged to be reasonable and necessary in order to provide the required intensity of service to ensure the patient's safety. The patient's presenting symptoms, physical exam findings, and initial radiographic and laboratory data in the context of their chronic comorbidities is felt to place them at high risk for further clinical deterioration. Furthermore, it  is not anticipated that the patient will be medically stable for discharge from the hospital within 2 midnights of admission.   * I certify that at the point of admission it is my clinical judgment that the patient will require inpatient hospital care spanning beyond 2 midnights from the point of admission due to high intensity of service, high risk for further deterioration and high frequency of surveillance required.*  Author: Clydie Braun, MD 09/22/2021 4:26 PM  For on call review [email or web address].

## 2021-09-22 NOTE — Assessment & Plan Note (Addendum)
Platelet count on admission 149. -Continue to monitor

## 2021-09-22 NOTE — Assessment & Plan Note (Signed)
BMI 35.43 kg/m

## 2021-09-23 DIAGNOSIS — J9602 Acute respiratory failure with hypercapnia: Secondary | ICD-10-CM | POA: Diagnosis not present

## 2021-09-23 DIAGNOSIS — E669 Obesity, unspecified: Secondary | ICD-10-CM | POA: Diagnosis not present

## 2021-09-23 DIAGNOSIS — J441 Chronic obstructive pulmonary disease with (acute) exacerbation: Secondary | ICD-10-CM | POA: Diagnosis not present

## 2021-09-23 DIAGNOSIS — J9601 Acute respiratory failure with hypoxia: Secondary | ICD-10-CM | POA: Diagnosis not present

## 2021-09-23 LAB — RESPIRATORY PANEL BY PCR

## 2021-09-23 LAB — COMPREHENSIVE METABOLIC PANEL
ALT: 13 U/L (ref 0–44)
AST: 12 U/L — ABNORMAL LOW (ref 15–41)
Albumin: 3.4 g/dL — ABNORMAL LOW (ref 3.5–5.0)
Alkaline Phosphatase: 55 U/L (ref 38–126)
Anion gap: 7 (ref 5–15)
BUN: 11 mg/dL (ref 8–23)
CO2: 32 mmol/L (ref 22–32)
Calcium: 8.1 mg/dL — ABNORMAL LOW (ref 8.9–10.3)
Chloride: 98 mmol/L (ref 98–111)
Creatinine, Ser: 0.67 mg/dL (ref 0.44–1.00)
GFR, Estimated: >60 mL/min (ref >60)
Glucose, Bld: 131 mg/dL — ABNORMAL HIGH (ref 70–99)
Potassium: 4.6 mmol/L (ref 3.5–5.1)
Sodium: 137 mmol/L (ref 135–145)
Total Bilirubin: 0.5 mg/dL (ref 0.3–1.2)
Total Protein: 7 g/dL (ref 6.5–8.1)

## 2021-09-23 LAB — HIV ANTIBODY (ROUTINE TESTING W REFLEX): HIV Screen 4th Generation wRfx: NONREACTIVE

## 2021-09-23 MED ORDER — IPRATROPIUM-ALBUTEROL 0.5-2.5 (3) MG/3ML IN SOLN: 3.0000 mL | Freq: Four times a day (QID) | RESPIRATORY_TRACT | Status: DC | PRN

## 2021-09-23 MED ORDER — METOPROLOL TARTRATE 5 MG/5ML IV SOLN
INTRAVENOUS | Status: AC
  Filled 2021-09-23: qty 5

## 2021-09-23 MED ORDER — MOMETASONE FURO-FORMOTEROL FUM 200-5 MCG/ACT IN AERO
2.0000 | INHALATION_SPRAY | Freq: Two times a day (BID) | RESPIRATORY_TRACT | Status: DC
  Administered 2021-09-24: 2 via RESPIRATORY_TRACT
  Filled 2021-09-23 (×2): qty 8.8

## 2021-09-23 MED ORDER — FUROSEMIDE 10 MG/ML IJ SOLN
40.0000 mg | Freq: Once | INTRAMUSCULAR | Status: AC
Start: 2021-09-23 — End: 2021-09-23
  Administered 2021-09-23: 40 mg via INTRAVENOUS
  Filled 2021-09-23: qty 4

## 2021-09-23 NOTE — Progress Notes (Signed)
Patient packed and moved to room 120, report called to [facility].  No distress noted and tele discontinued.

## 2021-09-23 NOTE — Progress Notes (Signed)
PT Cancellation Note  Patient Details Name: Yolanda Gray MRN: [id number] DOB: 12/07/1955   Cancelled Treatment:    Reason Eval/Treat Not Completed: Other (comment). PT spoke with patient and RN. Both reported that the patient has been up and moving this admission, including to the bathroom and back. Limiting factor is SOB/O2 needs. Pt denied any issues with balance, and reported she has significant DME at home if needed. PT provided HEP, pt verbalized understanding PT to sign off. Please reconsult PT if pt status changes or acute needs are identified.    Olga Coaster PT, DPT 3:06 PM,09/23/21

## 2021-09-23 NOTE — Progress Notes (Signed)
[facility]  - [place] at [facility]   PATIENT NAME: Yolanda Gray    MR#:  [id number]  DATE OF BIRTH:  Apr 17, 1956  SUBJECTIVE:   patient came in with increasing shortness of breath cough congestion and headache. Had sick contact at home. Currently on 5 L nasal cannula oxygen. desats easily. Chronic smoking quit about a week ago. Patient tells me she feels better than yesterday. Daughter in the room. No fever   VITALS:  Blood pressure (!) 147/59, pulse 80, temperature 98.1 F (36.7 C), temperature source Oral, resp. rate 18, height 5\' 3"  (1.6 m), weight 96 kg, SpO2 92 %.  PHYSICAL EXAMINATION:   GENERAL:  66 y.o.-year-old patient lying in the bed with no acute distress. Morbid obesity LUNGS: distant breath sounds bilaterally, no wheezing, rales, rhonchi.  CARDIOVASCULAR: S1, S2 normal. No murmurs, rubs, or gallops.  ABDOMEN: Soft, nontender, nondistended. Bowel sounds present.  EXTREMITIES: No  edema b/l.    NEUROLOGIC: nonfocal  patient is alert and awake SKIN: No obvious rash, lesion, or ulcer.   LABORATORY PANEL:  CBC Recent Labs  Lab 09/22/21 1440  WBC 8.3  HGB 15.3*  HCT 49.6*  PLT 149*    Chemistries  Recent Labs  Lab 09/23/21 0446  NA 137  K 4.6  CL 98  CO2 32  GLUCOSE 131*  BUN 11  CREATININE 0.67  CALCIUM 8.1*  AST 12*  ALT 13  ALKPHOS 55  BILITOT 0.5   Cardiac Enzymes No results for input(s): TROPONINI in the last 168 hours. RADIOLOGY:  DG Chest Portable 1 View  Result Date: 09/22/2021 CLINICAL DATA:  Pt via POV from home. Pt states she has been SOB for the past 2 days. States she has been getting over a common cold. Denies hx of COPD/CHF but is a smoker. Denies pain. N arrival, pt 70% on RA. Pt placed on NRB at this time 15L increased to 95% EXAM: PORTABLE CHEST 1 VIEW COMPARISON:  11/21/2003. FINDINGS: Cardiac silhouette is normal in size. No mediastinal or hilar masses. Lungs are clear.  No pleural effusion or pneumothorax.  Skeletal structures are grossly intact. IMPRESSION: No active disease. Electronically Signed   By: 01/21/2004 M.D.   On: 09/22/2021 15:44    Assessment and Plan  Yolanda Gray is a 66 y.o. female with medical history significant of tobacco abuse who presents with complaints of worsening shortness of breath for the last 2 to 3 days.  Her husband was sick initially first and likely gave it to her about a week ago.  Symptoms initially started with "head and chest cold".  Acute respiratory failure with hypoxia and hypercapnia secondary to COPD exacerbation Rhinovirus on Respiratory panel -- ongoing tobacco abuse -- patient now on 5 L nasal cannula oxygen -- received-IV Solu-Medrol now on PO prednisone -- continue bronchodilators and inhalers -- chest x-ray negative for pneumonia. Consistent with COPD -- no fever white count normal. No indication for antibiotic -- treat viral infection symptomatically -- patient desaturated's easily. She likely will need oxygen to go home with. RN to go home oxygen assessment  tobacco abuse -- patient reports at least 50 smoking packet history -- encourage smoking cessation  Obesity suspected sleep apnea -- patient advised lifestyle changes -- recommend get sleep study done as     Procedures: Family communication :dter Consults :none CODE STATUS: full DVT Prophylaxis :SCD Level of care: Med-Surg Status is: Inpatient Remains inpatient appropriate because: COPD flare  TOTAL TIME TAKING CARE OF THIS PATIENT: 25 minutes.  >50% time spent on counselling and coordination of care  Note: This dictation was prepared with Dragon dictation along with smaller phrase technology. Any transcriptional errors that result from this process are unintentional.  Enedina Finner M.D    [facility]   CC: Primary care physician; Pcp, No

## 2021-09-23 NOTE — Progress Notes (Signed)
Desat to 82% while walking in room on 3LNC,  90% when resting.

## 2021-09-23 NOTE — Progress Notes (Signed)
CSW spoke with patient and she agreed to the 30 day [employer] oxygen and will put a credit or debit card on file. CSW spoke with Jasmine at [facility] who will setup oxygen for tomorrows discharge.

## 2021-09-24 DIAGNOSIS — J441 Chronic obstructive pulmonary disease with (acute) exacerbation: Secondary | ICD-10-CM | POA: Diagnosis not present

## 2021-09-24 MED ORDER — LORATADINE 10 MG PO TABS: 10.0000 mg | ORAL_TABLET | Freq: Every day | ORAL | 0 refills | Status: DC | PRN

## 2021-09-24 MED ORDER — GUAIFENESIN ER 600 MG PO TB12: 600.0000 mg | ORAL_TABLET | Freq: Two times a day (BID) | ORAL | 0 refills | Status: DC

## 2021-09-24 MED ORDER — PREDNISONE 20 MG PO TABS: 40.0000 mg | ORAL_TABLET | Freq: Every day | ORAL | 0 refills | Status: AC

## 2021-09-24 MED ORDER — MOMETASONE FURO-FORMOTEROL FUM 200-5 MCG/ACT IN AERO: 2.0000 | INHALATION_SPRAY | Freq: Two times a day (BID) | RESPIRATORY_TRACT | 1 refills | Status: DC

## 2021-09-24 MED ORDER — ALBUTEROL SULFATE HFA 108 (90 BASE) MCG/ACT IN AERS: 2.0000 | INHALATION_SPRAY | Freq: Four times a day (QID) | RESPIRATORY_TRACT | 2 refills | Status: DC | PRN

## 2021-09-24 NOTE — TOC Progression Note (Signed)
Transition of Care [facility]) - Progression Note    Patient Details  Name: Yolanda Gray MRN: [id number] Date of Birth: 01-Jan-1956  Transition of [facility]) CM/SW Contact  Eileen Stanford, LCSW Phone Number: 09/24/2021, 9:29 AM  Clinical Narrative:   Charity 02 delivered to bedside.         Expected Discharge Plan and Services                                                 Social Determinants of Health (SDOH) Interventions    Readmission Risk Interventions No flowsheet data found.

## 2021-09-24 NOTE — Discharge Instructions (Signed)
Wear your oxygen as instructed Use your inhalers Smoking cessation advised

## 2021-09-24 NOTE — Discharge Summary (Addendum)
Physician Discharge Summary   Patient: Yolanda Gray MRN: [id number] DOB: 06-26-1956  Admit date:     09/22/2021  Discharge date: 09/24/21  Discharge Physician: Enedina Finner   PCP: Pcp, No   Recommendations at discharge:    Stop [facility] and [facility]  Discharge Diagnoses: Acute COPD exacerbation with Acute Hypoxic Respiratory failure--now on Oxygen Rhinovirus infection   Hospital Course: Yolanda Gray is a 66 y.o. female with medical history significant of tobacco abuse who presents with complaints of worsening shortness of breath for the last 2 to 3 days.  Her husband was sick initially first and likely gave it to her about a week ago.  Symptoms initially started with "head and chest cold".   Acute respiratory failure with hypoxia and hypercapnia secondary to COPD exacerbation Rhinovirus on Respiratory panel -- ongoing tobacco abuse -- patient now on 5 L nasal cannula oxygen -- received-IV Solu-Medrol now on PO prednisone -- continue bronchodilators and inhalers -- chest x-ray negative for pneumonia. Consistent with COPD -- no fever white count normal. No indication for antibiotic -- treat viral infection symptomatically -- patient desaturated's easily. She  will need oxygen to go home with--[employer] oxygen arranged --now on 3L [place] sats >90% Pt feels better and wants to go home   tobacco abuse -- patient reports at least 50 smoking packet history -- encourage smoking cessation   Obesity suspected sleep apnea -- patient advised lifestyle changes -- recommend get sleep study done as ou pt         Procedures: Family communication :husband at bedside Consults :none CODE STATUS: full DVT Prophylaxis :SCD Level of care: Med-Surg Disposition: Home Diet recommendation:  Discharge Diet Orders (From admission, onward)     Start     Ordered   09/24/21 0000  Diet general        09/24/21 1234           Regular diet  DISCHARGE  MEDICATION: Allergies as of 09/24/2021       Reactions   Pork-derived Products         Medication List     STOP taking these medications    Aspirin-Acetaminophen-Caffeine 500-500-65 MG Pack       TAKE these medications    albuterol 108 (90 Base) MCG/ACT inhaler Commonly known as: VENTOLIN HFA Inhale 2 puffs into the lungs every 6 (six) hours as needed for wheezing or shortness of breath.   guaiFENesin 600 MG 12 hr tablet Commonly known as: MUCINEX Take 1 tablet (600 mg total) by mouth 2 (two) times daily.   loratadine 10 MG tablet Commonly known as: CLARITIN Take 1 tablet (10 mg total) by mouth daily as needed for allergies or rhinitis.   mometasone-formoterol 200-5 MCG/ACT Aero Commonly known as: DULERA Inhale 2 puffs into the lungs 2 (two) times daily.   naproxen 500 MG tablet Commonly known as: NAPROSYN Take 1 tablet (500 mg total) by mouth 2 (two) times daily as needed for mild pain or moderate pain.   predniSONE 20 MG tablet Commonly known as: DELTASONE Take 2 tablets (40 mg total) by mouth daily with breakfast for 4 days. Start taking on: September 25, 2021               Durable Medical Equipment  (From admission, onward)           Start     Ordered   09/23/21 1127  For home use only DME oxygen  Once  Question Answer Comment  Length of Need 6 Months   Mode or (Route) Nasal cannula   Liters per Minute 3   Frequency Continuous (stationary and portable oxygen unit needed)   Oxygen conserving device Yes   Oxygen delivery system Gas      09/23/21 1126            Follow-up Information     [facility]. Call in 1 week(s).   Specialty: Primary Care Why: pt to call and make appt [facility] Contact information: [address] [place] [address] [phone]                Discharge Exam: Yolanda Gray Weights   09/22/21 1438 09/22/21 2300  Weight: 90.7 kg 96 kg      Condition at discharge: fair  The results of significant diagnostics from this hospitalization (including imaging, microbiology, ancillary and laboratory) are listed below for reference.   Imaging Studies: DG Chest Portable 1 View  Result Date: 09/22/2021 CLINICAL DATA:  Pt via POV from home. Pt states she has been SOB for the past 2 days. States she has been getting over a common cold. Denies hx of COPD/CHF but is a smoker. Denies pain. N arrival, pt 70% on RA. Pt placed on NRB at this time 15L increased to 95% EXAM: PORTABLE CHEST 1 VIEW COMPARISON:  11/21/2003. FINDINGS: Cardiac silhouette is normal in size. No mediastinal or hilar masses. Lungs are clear.  No pleural effusion or pneumothorax. Skeletal structures are grossly intact. IMPRESSION: No active disease. Electronically Signed   By: Amie Portland M.D.   On: 09/22/2021 15:44    Microbiology: Results for orders placed or performed during the hospital encounter of 09/22/21  Resp Panel by RT-PCR (Flu A&B, Covid) Nasopharyngeal Swab     Status: None   Collection Time: 09/22/21  2:40 PM   Specimen: Nasopharyngeal Swab; Nasopharyngeal(NP) swabs in vial transport medium  Result Value Ref Range Status   SARS Coronavirus 2 by RT PCR NEGATIVE NEGATIVE Final    Comment: (NOTE) SARS-CoV-2 target nucleic acids are NOT DETECTED.  The SARS-CoV-2 RNA is generally detectable in upper respiratory specimens during the acute phase of infection. The lowest concentration of SARS-CoV-2 viral copies this assay can detect is 138 copies/mL. A negative result does not preclude SARS-Cov-2 infection and should not be used as the sole basis for treatment or other patient management decisions. A negative result may occur with  improper specimen collection/handling, submission of specimen other than nasopharyngeal swab, presence of viral mutation(s) within the areas targeted by this assay, and inadequate number of viral copies(<138 copies/mL). A  negative result must be combined with clinical observations, patient history, and epidemiological information. The expected result is Negative.  Fact Sheet for Patients:  BloggerCourse.com  Fact Sheet for Healthcare Providers:  SeriousBroker.it  This test is no t yet approved or cleared by the [place] FDA and  has been authorized for detection and/or diagnosis of SARS-CoV-2 by FDA under an Emergency Use Authorization (EUA). This EUA will remain  in effect (meaning this test can be used) for the duration of the COVID-19 declaration under Section 564(b)(1) of the Act, 21 U.S.C.section 360bbb-3(b)(1), unless the authorization is terminated  or revoked sooner.       Influenza A by PCR NEGATIVE NEGATIVE Final   Influenza B by PCR NEGATIVE NEGATIVE Final    Comment: (NOTE) The Xpert Xpress SARS-CoV-2/FLU/RSV plus assay is intended as an aid  in the diagnosis of influenza from Nasopharyngeal swab specimens and should not be used as a sole basis for treatment. Nasal washings and aspirates are unacceptable for Xpert Xpress SARS-CoV-2/FLU/RSV testing.  Fact Sheet for Patients: BloggerCourse.com  Fact Sheet for Healthcare Providers: SeriousBroker.it  This test is not yet approved or cleared by the [place] FDA and has been authorized for detection and/or diagnosis of SARS-CoV-2 by FDA under an Emergency Use Authorization (EUA). This EUA will remain in effect (meaning this test can be used) for the duration of the COVID-19 declaration under Section 564(b)(1) of the Act, 21 U.S.C. section 360bbb-3(b)(1), unless the authorization is terminated or revoked.  Performed at [facility], [address] [place], [place] [address]   Respiratory (~20 pathogens) panel by PCR     Status: Abnormal   Collection Time: 09/22/21  2:40 PM   Specimen: Nasopharyngeal Swab;  Respiratory  Result Value Ref Range Status   Adenovirus NOT DETECTED NOT DETECTED Final   Coronavirus 229E NOT DETECTED NOT DETECTED Final    Comment: (NOTE) The Coronavirus on the Respiratory Panel, DOES NOT test for the novel  Coronavirus (2019 nCoV)    Coronavirus HKU1 NOT DETECTED NOT DETECTED Final   Coronavirus NL63 NOT DETECTED NOT DETECTED Final   Coronavirus OC43 NOT DETECTED NOT DETECTED Final   Metapneumovirus NOT DETECTED NOT DETECTED Final   Rhinovirus / Enterovirus DETECTED (A) NOT DETECTED Final   Influenza A NOT DETECTED NOT DETECTED Final   Influenza B NOT DETECTED NOT DETECTED Final   Parainfluenza Virus 1 NOT DETECTED NOT DETECTED Final   Parainfluenza Virus 2 NOT DETECTED NOT DETECTED Final   Parainfluenza Virus 3 NOT DETECTED NOT DETECTED Final   Parainfluenza Virus 4 NOT DETECTED NOT DETECTED Final   Respiratory Syncytial Virus NOT DETECTED NOT DETECTED Final   Bordetella pertussis NOT DETECTED NOT DETECTED Final   Bordetella Parapertussis NOT DETECTED NOT DETECTED Final   Chlamydophila pneumoniae NOT DETECTED NOT DETECTED Final   Mycoplasma pneumoniae NOT DETECTED NOT DETECTED Final    Comment: Performed at [facility], [address] [place], [place] [address]    Labs: CBC: Recent Labs  Lab 09/22/21 1440  WBC 8.3  NEUTROABS 6.2  HGB 15.3*  HCT 49.6*  MCV 93.6  PLT 149*   Basic Metabolic Panel: Recent Labs  Lab 09/22/21 1440 09/23/21 0446  NA 135 137  K 4.8 4.6  CL 98 98  CO2 30 32  GLUCOSE 149* 131*  BUN 10 11  CREATININE 0.71 0.67  CALCIUM 8.3* 8.1*   Liver Function Tests: Recent Labs  Lab 09/23/21 0446  AST 12*  ALT 13  ALKPHOS 55  BILITOT 0.5  PROT 7.0  ALBUMIN 3.4*   CBG: No results for input(s): GLUCAP in the last 168 hours.  Discharge time spent: greater than 30 minutes.  Signed: Enedina Finner, MD [facility] 09/24/2021

## 2023-09-29 ENCOUNTER — Encounter: Payer: Self-pay | Admitting: Ophthalmology

## 2023-09-30 ENCOUNTER — Encounter: Payer: Self-pay | Admitting: Ophthalmology

## 2023-09-30 NOTE — Anesthesia Preprocedure Evaluation (Addendum)
 Anesthesia Evaluation  Patient identified by MRN, date of birth, ID band Patient awake    Reviewed: Allergy & Precautions, H&P , NPO status , Patient's Chart, lab work & pertinent test results  Airway Mallampati: III  TM Distance: >3 FB Neck ROM: Full    Dental no notable dental hx. (+) Edentulous Upper, Edentulous Lower   Pulmonary neg pulmonary ROS, COPD, former smoker   Pulmonary exam normal breath sounds clear to auscultation       Cardiovascular negative cardio ROS Normal cardiovascular exam Rhythm:Regular Rate:Normal     Neuro/Psych negative neurological ROS  negative psych ROS   GI/Hepatic negative GI ROS, Neg liver ROS,,,  Endo/Other  negative endocrine ROS    Renal/GU negative Renal ROS  negative genitourinary   Musculoskeletal negative musculoskeletal ROS (+)    Abdominal   Peds negative pediatric ROS (+)  Hematology negative hematology ROS (+)   Anesthesia Other Findings Tobacco abuse COPD (chronic obstructive pulmonary disease)   Thrombocytopenia (HCC) Tobacco abuse  Polycythemia Morbid obesity with BMI of 40.0-44.9, adult (HCC)     Reproductive/Obstetrics negative OB ROS                              Anesthesia Physical Anesthesia Plan  ASA: 3  Anesthesia Plan: MAC   Post-op Pain Management:    Induction: Intravenous  PONV Risk Score and Plan:   Airway Management Planned: Natural Airway and Nasal Cannula  Additional Equipment:   Intra-op Plan:   Post-operative Plan:   Informed Consent: I have reviewed the patients History and Physical, chart, labs and discussed the procedure including the risks, benefits and alternatives for the proposed anesthesia with the patient or authorized representative who has indicated his/her understanding and acceptance.     Dental Advisory Given  Plan Discussed with: Anesthesiologist, CRNA and Surgeon  Anesthesia Plan  Comments: (Patient consented for risks of anesthesia including but not limited to:  - adverse reactions to medications - damage to eyes, teeth, lips or other oral mucosa - nerve damage due to positioning  - sore throat or hoarseness - Damage to heart, brain, nerves, lungs, other parts of body or loss of life  Patient voiced understanding and assent.)         Anesthesia Quick Evaluation

## 2023-10-07 NOTE — Discharge Instructions (Signed)

## 2023-10-09 ENCOUNTER — Ambulatory Visit: Payer: Medicare PPO | Admitting: Anesthesiology

## 2023-10-09 ENCOUNTER — Encounter
Admission: RE | Disposition: A | Discharge status: Home / Self Care | Payer: Self-pay | Source: Home / Self Care | Attending: Ophthalmology

## 2023-10-09 ENCOUNTER — Ambulatory Visit
Admission: RE | Admit: 2023-10-09 | Discharge: 2023-10-09 | Disposition: A | Discharge status: Home / Self Care | Payer: Medicare PPO | Attending: Ophthalmology | Admitting: Ophthalmology

## 2023-10-09 ENCOUNTER — Other Ambulatory Visit: Payer: Self-pay

## 2023-10-09 ENCOUNTER — Encounter: Payer: Self-pay | Admitting: Ophthalmology

## 2023-10-09 DIAGNOSIS — J449 Chronic obstructive pulmonary disease, unspecified: Secondary | ICD-10-CM | POA: Diagnosis not present

## 2023-10-09 DIAGNOSIS — Z87891 Personal history of nicotine dependence: Secondary | ICD-10-CM | POA: Diagnosis not present

## 2023-10-09 DIAGNOSIS — H2512 Age-related nuclear cataract, left eye: Secondary | ICD-10-CM | POA: Diagnosis present

## 2023-10-09 HISTORY — DX: Chronic obstructive pulmonary disease, unspecified: J44.9

## 2023-10-09 HISTORY — DX: Morbid (severe) obesity due to excess calories: E66.01

## 2023-10-09 HISTORY — DX: Thrombocytopenia, unspecified: D69.6

## 2023-10-09 HISTORY — DX: Secondary polycythemia: D75.1

## 2023-10-09 HISTORY — PX: CATARACT EXTRACTION W/PHACO: SHX586

## 2023-10-09 HISTORY — DX: Complete loss of teeth, unspecified cause, unspecified class: K08.109

## 2023-10-09 SURGERY — PHACOEMULSIFICATION, CATARACT, WITH IOL INSERTION
Anesthesia: Monitor Anesthesia Care | Laterality: Left

## 2023-10-09 MED ORDER — ARMC OPHTHALMIC DILATING DROPS
OPHTHALMIC | Status: AC
  Filled 2023-10-09: qty 0.5

## 2023-10-09 MED ORDER — PHENYLEPHRINE-KETOROLAC 1-0.3 % IO SOLN
INTRAOCULAR | Status: DC | PRN
  Administered 2023-10-09: 80 mL via OPHTHALMIC

## 2023-10-09 MED ORDER — FENTANYL CITRATE (PF) 100 MCG/2ML IJ SOLN
INTRAMUSCULAR | Status: AC
  Filled 2023-10-09: qty 2

## 2023-10-09 MED ORDER — MIDAZOLAM HCL 2 MG/2ML IJ SOLN
INTRAMUSCULAR | Status: AC
  Filled 2023-10-09: qty 2

## 2023-10-09 MED ORDER — MIDAZOLAM HCL 2 MG/2ML IJ SOLN
INTRAMUSCULAR | Status: DC | PRN
  Administered 2023-10-09: 2 mg via INTRAVENOUS

## 2023-10-09 MED ORDER — FENTANYL CITRATE (PF) 100 MCG/2ML IJ SOLN
INTRAMUSCULAR | Status: DC | PRN
Start: 2023-10-09 — End: 2023-10-09
  Administered 2023-10-09 (×2): 50 ug via INTRAVENOUS

## 2023-10-09 MED ORDER — ARMC OPHTHALMIC DILATING DROPS
1.0000 | OPHTHALMIC | Status: DC | PRN
  Administered 2023-10-09 (×3): 1 via OPHTHALMIC

## 2023-10-09 MED ORDER — BRIMONIDINE TARTRATE-TIMOLOL 0.2-0.5 % OP SOLN
OPHTHALMIC | Status: DC | PRN
  Administered 2023-10-09: 1 [drp] via OPHTHALMIC

## 2023-10-09 MED ORDER — MOXIFLOXACIN HCL 0.5 % OP SOLN
OPHTHALMIC | Status: DC | PRN
  Administered 2023-10-09: .2 mL via OPHTHALMIC

## 2023-10-09 MED ORDER — TETRACAINE HCL 0.5 % OP SOLN
OPHTHALMIC | Status: AC
  Filled 2023-10-09: qty 4

## 2023-10-09 MED ORDER — TETRACAINE HCL 0.5 % OP SOLN
1.0000 [drp] | OPHTHALMIC | Status: DC | PRN
  Administered 2023-10-09 (×3): 1 [drp] via OPHTHALMIC

## 2023-10-09 MED ORDER — SIGHTPATH DOSE#1 NA HYALUR & NA CHOND-NA HYALUR IO KIT
PACK | INTRAOCULAR | Status: DC | PRN
  Administered 2023-10-09: 1 via OPHTHALMIC

## 2023-10-09 MED ORDER — SIGHTPATH DOSE#1 BSS IO SOLN
INTRAOCULAR | Status: DC | PRN
  Administered 2023-10-09: 15 mL

## 2023-10-09 MED ORDER — LIDOCAINE HCL (PF) 2 % IJ SOLN
INTRAOCULAR | Status: DC | PRN
  Administered 2023-10-09: 1 mL via INTRAOCULAR

## 2023-10-09 SURGICAL SUPPLY — 23 items
BNDG EYE OVAL 2 1/8 X 2 5/8 (GAUZE/BANDAGES/DRESSINGS) IMPLANT
CANNULA ANT/CHMB 27G (MISCELLANEOUS) IMPLANT
CANNULA ANT/CHMB 27GA (MISCELLANEOUS) IMPLANT
CATARACT SUITE SIGHTPATH (MISCELLANEOUS) ×1 IMPLANT
DISSECTOR HYDRO NUCLEUS 50X22 (MISCELLANEOUS) ×1 IMPLANT
DRSG TEGADERM 2-3/8X2-3/4 SM (GAUZE/BANDAGES/DRESSINGS) ×1 IMPLANT
FEE CATARACT SUITE SIGHTPATH (MISCELLANEOUS) ×1 IMPLANT
GLOVE BIOGEL PI IND STRL 8 (GLOVE) ×1 IMPLANT
GLOVE PI ULTRA LF STRL 7.5 (GLOVE) IMPLANT
GLOVE SURG LX STRL 7.5 STRW (GLOVE) ×1 IMPLANT
GLOVE SURG PROTEXIS BL SZ6.5 (GLOVE) ×1 IMPLANT
GLOVE SURG SYN 6.5 PF PI BL (GLOVE) ×1 IMPLANT
LENS ENVISTA 23.0 (Intraocular Lens) ×1 IMPLANT
LENS IOL ENVISTA UV+ 23.0 (Intraocular Lens) IMPLANT
NDL FILTER BLUNT 18X1 1/2 (NEEDLE) ×1 IMPLANT
NEEDLE FILTER BLUNT 18X1 1/2 (NEEDLE) ×1 IMPLANT
PACK VIT ANT 23G (MISCELLANEOUS) IMPLANT
RING MALYGIN (MISCELLANEOUS) ×1 IMPLANT
RING MALYGIN 7.0 (MISCELLANEOUS) IMPLANT
SUT ETHILON 10-0 CS-B-6CS-B-6 (SUTURE) IMPLANT
SUTURE EHLN 10-0 CS-B-6CS-B-6 (SUTURE) IMPLANT
SYR 3ML LL SCALE MARK (SYRINGE) ×1 IMPLANT
SYR 5ML LL (SYRINGE) IMPLANT

## 2023-10-09 NOTE — Op Note (Signed)
 OPERATIVE NOTE  Yolanda Gray [id number] 10/09/2023   PREOPERATIVE DIAGNOSIS: Nuclear sclerotic cataract left eye. H25.12   POSTOPERATIVE DIAGNOSIS: Nuclear sclerotic cataract left eye. H25.12   PROCEDURE:  Phacoemusification with posterior chamber intraocular lens placement of the left eye  Ultrasound time: Procedure(s): CATARACT EXTRACTION PHACO AND INTRAOCULAR LENS PLACEMENT (IOC) LEFT MALYUGIN OMIDRIA 13.44 01:09.5 (Left)  LENS:   Implant Name Type Inv. Item Serial No. Manufacturer Lot No. LRB No. Used Action  ENVISTA  IOL EE 23.00D Intraocular Lens   [employer] [id number] Left 1 Implanted      SURGEON:  Julious Payer. Rolley Sims, MD   ANESTHESIA:  Topical with tetracaine drops, augmented with 1% preservative-free intracameral lidocaine.   COMPLICATIONS:  None.   DESCRIPTION OF PROCEDURE:  The patient was identified in the holding room and transported to the operating room and placed in the supine position under the operating microscope.  The left eye was identified as the operative eye, which was prepped and draped in the usual sterile ophthalmic fashion.   A 1 millimeter clear-corneal paracentesis was made inferotemporally. Preservative-free 1% lidocaine mixed with 1:1,000 bisulfite-free aqueous solution of epinephrine was injected into the anterior chamber. The anterior chamber was then filled with Viscoat viscoelastic. A 2.4 millimeter keratome was used to make a clear-corneal incision superotemporally. A curvilinear capsulorrhexis was made with a cystotome and capsulorrhexis forceps. Balanced salt solution was used to hydrodissect and hydrodelineate the nucleus. Phacoemulsification was then used to remove the lens nucleus and epinucleus. The remaining cortex was then removed using the irrigation and aspiration handpiece. Provisc was then placed into the capsular bag to distend it for lens placement. An +23.00 D EE intraocular lens was then injected into the capsular bag. The  remaining viscoelastic was aspirated.   Wounds were hydrated with balanced salt solution.  The anterior chamber was inflated to a physiologic pressure with balanced salt solution.  No wound leaks were noted. Moxifloxacin was injected intracamerally.  Timolol and Brimonidine drops were applied to the eye.  The patient was taken to the recovery room in stable condition without complications of anesthesia or surgery.  Yolanda Gray 10/09/2023, 9:54 AM

## 2023-10-09 NOTE — Anesthesia Postprocedure Evaluation (Signed)
 Anesthesia Post Note  Patient: Yolanda Gray  Procedure(s) Performed: CATARACT EXTRACTION PHACO AND INTRAOCULAR LENS PLACEMENT (IOC) LEFT MALYUGIN OMIDRIA 13.44 01:09.5 (Left)  Patient location during evaluation: PACU Anesthesia Type: MAC Level of consciousness: awake and alert Pain management: pain level controlled Vital Signs Assessment: post-procedure vital signs reviewed and stable Respiratory status: spontaneous breathing, nonlabored ventilation, respiratory function stable and patient connected to nasal cannula oxygen Cardiovascular status: stable and blood pressure returned to baseline Postop Assessment: no apparent nausea or vomiting Anesthetic complications: no   No notable events documented.   Last Vitals:  Vitals:   10/09/23 0955 10/09/23 1000  BP: (!) 146/75   Pulse: 72 80  Resp: 19 (!) 25  Temp: (!) 36.2 C (!) 36.2 C  SpO2: 95% 94%    Last Pain:  Vitals:   10/09/23 1000  TempSrc:   PainSc: 0-No pain                 Gurpreet Mariani C Malva Diesing

## 2023-10-09 NOTE — Transfer of Care (Signed)
 Immediate Anesthesia Transfer of Care Note  Patient: Yolanda Gray  Procedure(s) Performed: CATARACT EXTRACTION PHACO AND INTRAOCULAR LENS PLACEMENT (IOC) LEFT MALYUGIN OMIDRIA 13.44 01:09.5 (Left)  Patient Location: PACU  Anesthesia Type: MAC  Level of Consciousness: awake, alert  and patient cooperative  Airway and Oxygen Therapy: Patient Spontanous Breathing and Patient connected to supplemental oxygen  Post-op Assessment: Post-op Vital signs reviewed, Patient's Cardiovascular Status Stable, Respiratory Function Stable, Patent Airway and No signs of Nausea or vomiting  Post-op Vital Signs: Reviewed and stable  Complications: No notable events documented.

## 2023-10-09 NOTE — H&P (Signed)
 [facility]   Primary Care Physician:  Pcp, No Ophthalmologist: Dr. Deberah Pelton  Pre-Procedure History & Physical: HPI:  Yolanda Gray is a 68 y.o. female here for cataract surgery.   Past Medical History:  Diagnosis Date   COPD (chronic obstructive pulmonary disease) (HCC)    Morbid obesity with BMI of 40.0-44.9, adult (HCC)    Obesity (BMI 30-39.9)    Polycythemia    Thrombocytopenia (HCC)    Tobacco abuse    Tobacco abuse     Past Surgical History:  Procedure Laterality Date   CESAREAN SECTION     TONSILLECTOMY      Prior to Admission medications   Not on File    Allergies as of 08/19/2023 - Review Complete 09/22/2021  Allergen Reaction Noted   Pork-derived products  09/22/2021    Family History  Problem Relation Age of Onset   Asthma Mother    Heart attack Mother    Heart attack Father     Social History   Socioeconomic History   Marital status: Married    Spouse name: Not on file   Number of children: Not on file   Years of education: Not on file   Highest education level: Not on file  Occupational History   Not on file  Tobacco Use   Smoking status: Former    Current packs/day: 0.00    Types: Cigarettes    Quit date: 2022    Years since quitting: 3.1   Smokeless tobacco: Never  Vaping Use   Vaping status: Never Used  Substance and Sexual Activity   Alcohol use: Not Currently   Drug use: Never   Sexual activity: Not on file  Other Topics Concern   Not on file  Social History Narrative   Not on file   Social Drivers of Health   Financial Resource Strain: Not on file  Food Insecurity: Not on file  Transportation Needs: Not on file  Physical Activity: Not on file  Stress: Not on file  Social Connections: Not on file  Intimate Partner Violence: Not on file    Review of Systems: See HPI, otherwise negative ROS  Physical Exam: Ht 5\' 3"  (1.6 m)   Wt 104.3 kg   BMI 40.74 kg/m  General:   Alert, cooperative in NAD Head:   Normocephalic and atraumatic. Respiratory:  Normal work of breathing. Cardiovascular:  RRR  Impression/Plan: Yolanda Gray is here for cataract surgery.  Risks, benefits, limitations, and alternatives regarding cataract surgery have been reviewed with the patient.  Questions have been answered.  All parties agreeable.   Estanislado Pandy, MD  10/09/2023, 7:06 AM

## 2023-10-10 ENCOUNTER — Encounter: Payer: Self-pay | Admitting: Ophthalmology

## 2023-10-14 ENCOUNTER — Encounter: Payer: Self-pay | Admitting: Ophthalmology

## 2023-10-21 NOTE — Discharge Instructions (Signed)

## 2023-10-22 NOTE — Anesthesia Preprocedure Evaluation (Signed)
 Anesthesia Evaluation    Airway Mallampati: III  TM Distance: >3 FB     Dental  (+) Edentulous Upper, Edentulous Lower   Pulmonary former smoker          Cardiovascular      Neuro/Psych    GI/Hepatic   Endo/Other    Renal/GU      Musculoskeletal   Abdominal   Peds  Hematology   Anesthesia Other Findings Previous cataract surgery 10-09-23 Dr. Juel Burrow anesthesiologist  Tobacco abuse  COPD (chronic obstructive pulmonary disease) (HCC)  Thrombocytopenia (HCC) Tobacco abuse  Polycythemia Morbid obesity with BMI of 40.0-44.9, adult (HCC)  Edentulous    Reproductive/Obstetrics                              Anesthesia Physical Anesthesia Plan  ASA: 3  Anesthesia Plan: MAC   Post-op Pain Management:    Induction: Intravenous  PONV Risk Score and Plan:   Airway Management Planned: Natural Airway and Nasal Cannula  Additional Equipment:   Intra-op Plan:   Post-operative Plan:   Informed Consent: I have reviewed the patients History and Physical, chart, labs and discussed the procedure including the risks, benefits and alternatives for the proposed anesthesia with the patient or authorized representative who has indicated his/her understanding and acceptance.     Dental Advisory Given  Plan Discussed with: Anesthesiologist, CRNA and Surgeon  Anesthesia Plan Comments: (Patient consented for risks of anesthesia including but not limited to:  - adverse reactions to medications - damage to eyes, teeth, lips or other oral mucosa - nerve damage due to positioning  - sore throat or hoarseness - Damage to heart, brain, nerves, lungs, other parts of body or loss of life  Patient voiced understanding and assent.)         Anesthesia Quick Evaluation

## 2023-10-23 ENCOUNTER — Encounter: Payer: Self-pay | Admitting: Anesthesiology

## 2023-10-23 ENCOUNTER — Ambulatory Visit: Admission: RE | Admit: 2023-10-23 | Payer: Medicare PPO | Source: Home / Self Care | Admitting: Ophthalmology

## 2023-10-23 SURGERY — PHACOEMULSIFICATION, CATARACT, WITH IOL INSERTION
Anesthesia: Topical | Laterality: Right

## 2023-10-23 MED ORDER — FENTANYL CITRATE (PF) 100 MCG/2ML IJ SOLN
INTRAMUSCULAR | Status: AC
  Filled 2023-10-23: qty 2

## 2023-10-23 MED ORDER — MIDAZOLAM HCL 2 MG/2ML IJ SOLN
INTRAMUSCULAR | Status: AC
  Filled 2023-10-23: qty 2

## 2023-10-23 NOTE — H&P (Signed)
 [facility]   Primary Care Physician:  Pcp, No Ophthalmologist: Dr. Deberah Pelton  Pre-Procedure History & Physical: HPI:  Yolanda Gray is a 68 y.o. female here for cataract surgery.   Past Medical History:  Diagnosis Date   COPD (chronic obstructive pulmonary disease) (HCC)    Edentulous    Morbid obesity with BMI of 40.0-44.9, adult (HCC)    Obesity (BMI 30-39.9)    Polycythemia    Thrombocytopenia (HCC)    Tobacco abuse    Tobacco abuse     Past Surgical History:  Procedure Laterality Date   CATARACT EXTRACTION W/PHACO Left 10/09/2023   Procedure: CATARACT EXTRACTION PHACO AND INTRAOCULAR LENS PLACEMENT (IOC) LEFT MALYUGIN OMIDRIA 13.44 01:09.5;  Surgeon: Estanislado Pandy, MD;  Location: [facility];  Service: Ophthalmology;  Laterality: Left;   CESAREAN SECTION     TONSILLECTOMY      Prior to Admission medications   Not on File    Allergies as of 08/19/2023 - Review Complete 09/22/2021  Allergen Reaction Noted   Pork-derived products  09/22/2021    Family History  Problem Relation Age of Onset   Asthma Mother    Heart attack Mother    Heart attack Father     Social History   Socioeconomic History   Marital status: Married    Spouse name: Not on file   Number of children: Not on file   Years of education: Not on file   Highest education level: Not on file  Occupational History   Not on file  Tobacco Use   Smoking status: Former    Current packs/day: 0.00    Types: Cigarettes    Quit date: 2022    Years since quitting: 3.1   Smokeless tobacco: Never  Vaping Use   Vaping status: Never Used  Substance and Sexual Activity   Alcohol use: Not Currently   Drug use: Never   Sexual activity: Not on file  Other Topics Concern   Not on file  Social History Narrative   Not on file   Social Drivers of Health   Financial Resource Strain: Not on file  Food Insecurity: Not on file  Transportation Needs: Not on file  Physical  Activity: Not on file  Stress: Not on file  Social Connections: Not on file  Intimate Partner Violence: Not on file    Review of Systems: See HPI, otherwise negative ROS  Physical Exam: There were no vitals taken for this visit. General:   Alert, cooperative in NAD Head:  Normocephalic and atraumatic. Respiratory:  Normal work of breathing. Cardiovascular:  RRR  Impression/Plan: Yolanda Gray is here for cataract surgery.  Risks, benefits, limitations, and alternatives regarding cataract surgery have been reviewed with the patient.  Questions have been answered.  All parties agreeable.   Estanislado Pandy, MD  10/23/2023, 7:03 AM

## 2023-10-29 NOTE — Discharge Instructions (Signed)

## 2023-10-30 ENCOUNTER — Other Ambulatory Visit: Payer: Self-pay

## 2023-10-30 ENCOUNTER — Encounter: Payer: Self-pay | Admitting: Ophthalmology

## 2023-10-30 ENCOUNTER — Ambulatory Visit: Payer: Self-pay | Admitting: Anesthesiology

## 2023-10-30 ENCOUNTER — Encounter
Admission: RE | Disposition: A | Discharge status: Home / Self Care | Payer: Self-pay | Source: Home / Self Care | Attending: Ophthalmology

## 2023-10-30 ENCOUNTER — Ambulatory Visit
Admission: RE | Admit: 2023-10-30 | Discharge: 2023-10-30 | Disposition: A | Discharge status: Home / Self Care | Attending: Ophthalmology | Admitting: Ophthalmology

## 2023-10-30 DIAGNOSIS — E66813 Obesity, class 3: Secondary | ICD-10-CM | POA: Insufficient documentation

## 2023-10-30 DIAGNOSIS — H2511 Age-related nuclear cataract, right eye: Secondary | ICD-10-CM | POA: Insufficient documentation

## 2023-10-30 DIAGNOSIS — J449 Chronic obstructive pulmonary disease, unspecified: Secondary | ICD-10-CM | POA: Diagnosis not present

## 2023-10-30 DIAGNOSIS — Z87891 Personal history of nicotine dependence: Secondary | ICD-10-CM | POA: Insufficient documentation

## 2023-10-30 DIAGNOSIS — Z6841 Body Mass Index (BMI) 40.0 and over, adult: Secondary | ICD-10-CM | POA: Insufficient documentation

## 2023-10-30 HISTORY — PX: CATARACT EXTRACTION W/PHACO: SHX586

## 2023-10-30 SURGERY — PHACOEMULSIFICATION, CATARACT, WITH IOL INSERTION
Anesthesia: Monitor Anesthesia Care | Laterality: Right

## 2023-10-30 MED ORDER — TETRACAINE HCL 0.5 % OP SOLN
1.0000 [drp] | OPHTHALMIC | Status: DC | PRN
  Administered 2023-10-30 (×3): 1 [drp] via OPHTHALMIC

## 2023-10-30 MED ORDER — MIDAZOLAM HCL 2 MG/2ML IJ SOLN
INTRAMUSCULAR | Status: AC
  Filled 2023-10-30: qty 2

## 2023-10-30 MED ORDER — TETRACAINE HCL 0.5 % OP SOLN
OPHTHALMIC | Status: AC
  Filled 2023-10-30: qty 4

## 2023-10-30 MED ORDER — MOXIFLOXACIN HCL 0.5 % OP SOLN
OPHTHALMIC | Status: DC | PRN
  Administered 2023-10-30: .2 mL via OPHTHALMIC

## 2023-10-30 MED ORDER — SIGHTPATH DOSE#1 BSS IO SOLN
INTRAOCULAR | Status: DC | PRN
  Administered 2023-10-30: 15 mL via INTRAOCULAR

## 2023-10-30 MED ORDER — ARMC OPHTHALMIC DILATING DROPS
1.0000 | OPHTHALMIC | Status: DC | PRN
  Administered 2023-10-30 (×2): 1 via OPHTHALMIC

## 2023-10-30 MED ORDER — BRIMONIDINE TARTRATE-TIMOLOL 0.2-0.5 % OP SOLN
OPHTHALMIC | Status: DC | PRN
  Administered 2023-10-30: 1 [drp] via OPHTHALMIC

## 2023-10-30 MED ORDER — LIDOCAINE HCL (PF) 2 % IJ SOLN
INTRAOCULAR | Status: DC | PRN
  Administered 2023-10-30: 1 mL via INTRAOCULAR

## 2023-10-30 MED ORDER — ARMC OPHTHALMIC DILATING DROPS
OPHTHALMIC | Status: AC
  Filled 2023-10-30: qty 0.5

## 2023-10-30 MED ORDER — PHENYLEPHRINE-KETOROLAC 1-0.3 % IO SOLN
INTRAOCULAR | Status: DC | PRN
  Administered 2023-10-30: 74 mL via OPHTHALMIC

## 2023-10-30 MED ORDER — MIDAZOLAM HCL 2 MG/2ML IJ SOLN
INTRAMUSCULAR | Status: DC | PRN
  Administered 2023-10-30: 2 mg via INTRAVENOUS

## 2023-10-30 MED ORDER — SIGHTPATH DOSE#1 NA HYALUR & NA CHOND-NA HYALUR IO KIT
PACK | INTRAOCULAR | Status: DC | PRN
  Administered 2023-10-30: 1 via OPHTHALMIC

## 2023-10-30 MED ORDER — SODIUM CHLORIDE 0.9% FLUSH: 3.0000 mL | Freq: Two times a day (BID) | INTRAVENOUS | Status: DC

## 2023-10-30 MED ORDER — SODIUM CHLORIDE 0.9% FLUSH: 3.0000 mL | INTRAVENOUS | Status: DC | PRN

## 2023-10-30 SURGICAL SUPPLY — 13 items
CATARACT SUITE SIGHTPATH (MISCELLANEOUS) ×1 IMPLANT
DISSECTOR HYDRO NUCLEUS 50X22 (MISCELLANEOUS) ×1 IMPLANT
DRSG TEGADERM 2-3/8X2-3/4 SM (GAUZE/BANDAGES/DRESSINGS) ×1 IMPLANT
FEE CATARACT SUITE SIGHTPATH (MISCELLANEOUS) ×1 IMPLANT
GLOVE BIOGEL PI IND STRL 8 (GLOVE) ×1 IMPLANT
GLOVE SURG LX STRL 7.5 STRW (GLOVE) ×1 IMPLANT
GLOVE SURG PROTEXIS BL SZ6.5 (GLOVE) ×1 IMPLANT
GLOVE SURG SYN 6.5 PF PI BL (GLOVE) ×1 IMPLANT
LENS ENVISTA 24.0 (Intraocular Lens) ×1 IMPLANT
LENS IOL ENVISTA UV+ 24.0 (Intraocular Lens) IMPLANT
NDL FILTER BLUNT 18X1 1/2 (NEEDLE) ×1 IMPLANT
NEEDLE FILTER BLUNT 18X1 1/2 (NEEDLE) ×1 IMPLANT
SYR 3ML LL SCALE MARK (SYRINGE) ×1 IMPLANT

## 2023-10-30 NOTE — Transfer of Care (Signed)
 Immediate Anesthesia Transfer of Care Note  Patient: Yolanda Gray  Procedure(s) Performed: PHACOEMULSIFICATION, CATARACT, WITH IOL INSERTION 6.65, 00:53.8 (Right)  Patient Location: PACU  Anesthesia Type: MAC  Level of Consciousness: awake, alert  and patient cooperative  Airway and Oxygen Therapy: Patient Spontanous Breathing and Patient connected to supplemental oxygen  Post-op Assessment: Post-op Vital signs reviewed, Patient's Cardiovascular Status Stable, Respiratory Function Stable, Patent Airway and No signs of Nausea or vomiting  Post-op Vital Signs: Reviewed and stable  Complications: No notable events documented.

## 2023-10-30 NOTE — H&P (Signed)
 Yolanda Gray   Primary Care Physician:  Pcp, No Ophthalmologist: Dr. Deberah Pelton  Pre-Procedure History & Physical: HPI:  Yolanda Gray is a 68 y.o. female here for cataract surgery.   Past Medical History:  Diagnosis Date   COPD (chronic obstructive pulmonary disease) (HCC)    Edentulous    Morbid obesity with BMI of 40.0-44.9, adult (HCC)    Obesity (BMI 30-39.9)    Polycythemia    Thrombocytopenia (HCC)    Tobacco abuse    Tobacco abuse     Past Surgical History:  Procedure Laterality Date   CATARACT EXTRACTION W/PHACO Left 10/09/2023   Procedure: CATARACT EXTRACTION PHACO AND INTRAOCULAR LENS PLACEMENT (IOC) LEFT MALYUGIN OMIDRIA 13.44 01:09.5;  Surgeon: Estanislado Pandy, MD;  Location: [facility];  Service: Ophthalmology;  Laterality: Left;   CESAREAN SECTION     TONSILLECTOMY      Prior to Admission medications   Not on File    Allergies as of 10/27/2023 - Review Complete 10/14/2023  Allergen Reaction Noted   Pork-derived products  09/22/2021    Family History  Problem Relation Age of Onset   Asthma Mother    Heart attack Mother    Heart attack Father     Social History   Socioeconomic History   Marital status: Married    Spouse name: Not on file   Number of children: Not on file   Years of education: Not on file   Highest education level: Not on file  Occupational History   Not on file  Tobacco Use   Smoking status: Former    Current packs/day: 0.00    Types: Cigarettes    Quit date: 2022    Years since quitting: 3.2   Smokeless tobacco: Never  Vaping Use   Vaping status: Never Used  Substance and Sexual Activity   Alcohol use: Not Currently   Drug use: Never   Sexual activity: Not on file  Other Topics Concern   Not on file  Social History Narrative   Not on file   Social Drivers of Health   Financial Resource Strain: Not on file  Food Insecurity: Not on file  Transportation Needs: Not on file  Physical  Activity: Not on file  Stress: Not on file  Social Connections: Not on file  Intimate Partner Violence: Not on file    Review of Systems: See HPI, otherwise negative ROS  Physical Exam: There were no vitals taken for this visit. General:   Alert, cooperative in NAD Head:  Normocephalic and atraumatic. Respiratory:  Normal work of breathing. Cardiovascular:  RRR  Impression/Plan: Yolanda Gray is here for cataract surgery.  Risks, benefits, limitations, and alternatives regarding cataract surgery have been reviewed with the patient.  Questions have been answered.  All parties agreeable.   Estanislado Pandy, MD  10/30/2023, 7:21 AM

## 2023-10-30 NOTE — Op Note (Signed)
 OPERATIVE NOTE  Yolanda Gray [id number] 10/30/2023   PREOPERATIVE DIAGNOSIS: Nuclear sclerotic cataract right eye. H25.11   POSTOPERATIVE DIAGNOSIS: Nuclear sclerotic cataract right eye. H25.11   PROCEDURE:  Phacoemusification with posterior chamber intraocular lens placement of the right eye  Ultrasound time: Procedure(s): PHACOEMULSIFICATION, CATARACT, WITH IOL INSERTION 6.65, 00:53.8 (Right)  LENS:   Implant Name Type Inv. Item Serial No. Manufacturer Lot No. LRB No. Used Action  LENS ENVISTA 24.0 - [id number] Intraocular Lens LENS ENVISTA 24.0 [id number] [employer]  Right 1 Implanted      SURGEON:  Julious Payer. Rolley Sims, MD   ANESTHESIA:  Topical with tetracaine drops, augmented with 1% preservative-free intracameral lidocaine.   COMPLICATIONS:  None.   DESCRIPTION OF PROCEDURE:  The patient was identified in the holding room and transported to the operating room and placed in the supine position under the operating microscope.  The right eye was identified as the operative eye, which was prepped and draped in the usual sterile ophthalmic fashion.   A 1 millimeter clear-corneal paracentesis was made superotemporally. Preservative-free 1% lidocaine mixed with 1:1,000 bisulfite-free aqueous solution of epinephrine was injected into the anterior chamber. The anterior chamber was then filled with Viscoat viscoelastic. A 2.4 millimeter keratome was used to make a clear-corneal incision inferotemporally. A curvilinear capsulorrhexis was made with a cystotome and capsulorrhexis forceps. Balanced salt solution was used to hydrodissect and hydrodelineate the nucleus. Phacoemulsification was then used to remove the lens nucleus and epinucleus. The remaining cortex was then removed using the irrigation and aspiration handpiece. Provisc was then placed into the capsular bag to distend it for lens placement. A +24.00 D EE intraocular lens was then injected into the capsular bag. The remaining  viscoelastic was aspirated.   Wounds were hydrated with balanced salt solution.  The anterior chamber was inflated to a physiologic pressure with balanced salt solution.  No wound leaks were noted. Moxifloxacin was injected intracamerally.  Timolol and Brimonidine drops were applied to the eye.  The patient was taken to the recovery room in stable condition without complications of anesthesia or surgery.  [employer] 10/30/2023, 9:00 AM

## 2023-10-30 NOTE — Anesthesia Preprocedure Evaluation (Signed)
 Anesthesia Evaluation  Patient identified by MRN, date of birth, ID band Patient awake    Reviewed: Allergy & Precautions, NPO status , Patient's Chart, lab work & pertinent test results  History of Anesthesia Complications Negative for: history of anesthetic complications  Airway Mallampati: III   Neck ROM: Full    Dental  (+) Edentulous Upper, Edentulous Lower   Pulmonary COPD, former smoker (quit 2022)   Pulmonary exam normal breath sounds clear to auscultation       Cardiovascular Exercise Tolerance: Good negative cardio ROS Normal cardiovascular exam Rhythm:Regular Rate:Normal     Neuro/Psych negative neurological ROS     GI/Hepatic negative GI ROS,,,  Endo/Other    Class 3 obesity  Renal/GU negative Renal ROS     Musculoskeletal   Abdominal   Peds  Hematology negative hematology ROS (+)   Anesthesia Other Findings   Reproductive/Obstetrics                             Anesthesia Physical Anesthesia Plan  ASA: 3  Anesthesia Plan: MAC   Post-op Pain Management:    Induction: Intravenous  PONV Risk Score and Plan: 2 and Treatment may vary due to age or medical condition, Midazolam and TIVA  Airway Management Planned: Natural Airway and Nasal Cannula  Additional Equipment:   Intra-op Plan:   Post-operative Plan:   Informed Consent: I have reviewed the patients History and Physical, chart, labs and discussed the procedure including the risks, benefits and alternatives for the proposed anesthesia with the patient or authorized representative who has indicated his/her understanding and acceptance.     Dental advisory given  Plan Discussed with: CRNA  Anesthesia Plan Comments: (LMA/GETA backup discussed.  Patient consented for risks of anesthesia including but not limited to:  - adverse reactions to medications - damage to eyes, teeth, lips or other oral mucosa -  nerve damage due to positioning  - sore throat or hoarseness - damage to heart, brain, nerves, lungs, other parts of body or loss of life  Informed patient about role of CRNA in peri- and intra-operative care.  Patient voiced understanding.)       Anesthesia Quick Evaluation

## 2023-10-30 NOTE — Anesthesia Postprocedure Evaluation (Signed)
 Anesthesia Post Note  Patient: Yolanda Gray  Procedure(s) Performed: PHACOEMULSIFICATION, CATARACT, WITH IOL INSERTION 6.65, 00:53.8 (Right)  Patient location during evaluation: PACU Anesthesia Type: MAC Level of consciousness: awake and alert, oriented and patient cooperative Pain management: pain level controlled Vital Signs Assessment: post-procedure vital signs reviewed and stable Respiratory status: spontaneous breathing, nonlabored ventilation and respiratory function stable Cardiovascular status: blood pressure returned to baseline and stable Postop Assessment: adequate PO intake Anesthetic complications: no   No notable events documented.   Last Vitals:  Vitals:   10/30/23 0905 10/30/23 0907  BP: (!) 147/74 131/76  Pulse: 81 80  Resp: 16 20  Temp:  36.5 C  SpO2: 95% 95%    Last Pain:  Vitals:   10/30/23 0907  TempSrc:   PainSc: 0-No pain                 Reed Breech

## 2023-10-31 ENCOUNTER — Encounter: Payer: Self-pay | Admitting: Ophthalmology

## 2024-06-21 ENCOUNTER — Other Ambulatory Visit: Payer: Self-pay

## 2024-06-21 ENCOUNTER — Emergency Department

## 2024-06-21 ENCOUNTER — Inpatient Hospital Stay
Admission: EM | Admit: 2024-06-21 | Discharge: 2024-06-26 | DRG: 871 | Disposition: A | Discharge status: Home / Self Care | Attending: Family Medicine | Admitting: Family Medicine

## 2024-06-21 DIAGNOSIS — Z961 Presence of intraocular lens: Secondary | ICD-10-CM | POA: Diagnosis present

## 2024-06-21 DIAGNOSIS — G4733 Obstructive sleep apnea (adult) (pediatric): Secondary | ICD-10-CM | POA: Diagnosis present

## 2024-06-21 DIAGNOSIS — K5720 Diverticulitis of large intestine with perforation and abscess without bleeding: Principal | ICD-10-CM | POA: Diagnosis present

## 2024-06-21 DIAGNOSIS — I4891 Unspecified atrial fibrillation: Secondary | ICD-10-CM | POA: Diagnosis not present

## 2024-06-21 DIAGNOSIS — Z91014 Allergy to mammalian meats: Secondary | ICD-10-CM

## 2024-06-21 DIAGNOSIS — J449 Chronic obstructive pulmonary disease, unspecified: Secondary | ICD-10-CM | POA: Diagnosis present

## 2024-06-21 DIAGNOSIS — R3589 Other polyuria: Secondary | ICD-10-CM | POA: Diagnosis present

## 2024-06-21 DIAGNOSIS — N179 Acute kidney failure, unspecified: Secondary | ICD-10-CM | POA: Diagnosis present

## 2024-06-21 DIAGNOSIS — Z9841 Cataract extraction status, right eye: Secondary | ICD-10-CM | POA: Diagnosis not present

## 2024-06-21 DIAGNOSIS — E66812 Obesity, class 2: Secondary | ICD-10-CM | POA: Diagnosis present

## 2024-06-21 DIAGNOSIS — E1169 Type 2 diabetes mellitus with other specified complication: Secondary | ICD-10-CM | POA: Diagnosis not present

## 2024-06-21 DIAGNOSIS — E669 Obesity, unspecified: Secondary | ICD-10-CM | POA: Diagnosis present

## 2024-06-21 DIAGNOSIS — D696 Thrombocytopenia, unspecified: Secondary | ICD-10-CM | POA: Diagnosis present

## 2024-06-21 DIAGNOSIS — A419 Sepsis, unspecified organism: Principal | ICD-10-CM | POA: Diagnosis present

## 2024-06-21 DIAGNOSIS — E119 Type 2 diabetes mellitus without complications: Secondary | ICD-10-CM

## 2024-06-21 DIAGNOSIS — Z825 Family history of asthma and other chronic lower respiratory diseases: Secondary | ICD-10-CM

## 2024-06-21 DIAGNOSIS — R739 Hyperglycemia, unspecified: Secondary | ICD-10-CM

## 2024-06-21 DIAGNOSIS — D751 Secondary polycythemia: Secondary | ICD-10-CM | POA: Diagnosis present

## 2024-06-21 DIAGNOSIS — N390 Urinary tract infection, site not specified: Secondary | ICD-10-CM | POA: Diagnosis present

## 2024-06-21 DIAGNOSIS — Z8249 Family history of ischemic heart disease and other diseases of the circulatory system: Secondary | ICD-10-CM

## 2024-06-21 DIAGNOSIS — E861 Hypovolemia: Secondary | ICD-10-CM | POA: Diagnosis present

## 2024-06-21 DIAGNOSIS — E1100 Type 2 diabetes mellitus with hyperosmolarity without nonketotic hyperglycemic-hyperosmolar coma (NKHHC): Secondary | ICD-10-CM

## 2024-06-21 DIAGNOSIS — R1084 Generalized abdominal pain: Secondary | ICD-10-CM

## 2024-06-21 DIAGNOSIS — E0810 Diabetes mellitus due to underlying condition with ketoacidosis without coma: Secondary | ICD-10-CM | POA: Diagnosis not present

## 2024-06-21 DIAGNOSIS — E1110 Type 2 diabetes mellitus with ketoacidosis without coma: Secondary | ICD-10-CM | POA: Diagnosis present

## 2024-06-21 DIAGNOSIS — Z6839 Body mass index (BMI) 39.0-39.9, adult: Secondary | ICD-10-CM

## 2024-06-21 DIAGNOSIS — Z1152 Encounter for screening for COVID-19: Secondary | ICD-10-CM

## 2024-06-21 DIAGNOSIS — Z716 Tobacco abuse counseling: Secondary | ICD-10-CM | POA: Diagnosis not present

## 2024-06-21 DIAGNOSIS — R6521 Severe sepsis with septic shock: Secondary | ICD-10-CM | POA: Diagnosis present

## 2024-06-21 DIAGNOSIS — K651 Peritoneal abscess: Secondary | ICD-10-CM | POA: Diagnosis present

## 2024-06-21 DIAGNOSIS — Z9842 Cataract extraction status, left eye: Secondary | ICD-10-CM | POA: Diagnosis not present

## 2024-06-21 LAB — CBC
HCT: 44.1 % (ref 36.0–46.0)
Hemoglobin: 13.6 g/dL (ref 12.0–15.0)
MCH: 28 pg (ref 26.0–34.0)
MCHC: 30.8 g/dL (ref 30.0–36.0)
MCV: 90.9 fL (ref 80.0–100.0)
Platelets: 311 K/uL (ref 150–400)
RBC: 4.85 MIL/uL (ref 3.87–5.11)
RDW: 13.4 % (ref 11.5–15.5)
WBC: 9.1 K/uL (ref 4.0–10.5)
nRBC: 0 % (ref 0.0–0.2)

## 2024-06-21 LAB — LACTIC ACID, PLASMA
Lactic Acid, Venous: 4.9 mmol/L (ref 0.5–1.9)
Lactic Acid, Venous: 8 mmol/L (ref 0.5–1.9)
Lactic Acid, Venous: 2.5 mmol/L (ref 0.5–1.9)

## 2024-06-21 LAB — URINALYSIS, ROUTINE W REFLEX MICROSCOPIC
Bilirubin Urine: NEGATIVE
Glucose, UA: >=500 mg/dL — AB
Hgb urine dipstick: NEGATIVE
Ketones, ur: NEGATIVE mg/dL
Nitrite: NEGATIVE
Protein, ur: >=300 mg/dL — AB
Specific Gravity, Urine: >1.046 — ABNORMAL HIGH (ref 1.005–1.030)
pH: 5 (ref 5.0–8.0)

## 2024-06-21 LAB — COMPREHENSIVE METABOLIC PANEL WITH GFR
ALT: 19 U/L (ref 0–44)
AST: 40 U/L (ref 15–41)
Albumin: 3.5 g/dL (ref 3.5–5.0)
Alkaline Phosphatase: 85 U/L (ref 38–126)
Anion gap: 25 — ABNORMAL HIGH (ref 5–15)
BUN: 15 mg/dL (ref 8–23)
CO2: 16 mmol/L — ABNORMAL LOW (ref 22–32)
Calcium: 8.7 mg/dL — ABNORMAL LOW (ref 8.9–10.3)
Chloride: 92 mmol/L — ABNORMAL LOW (ref 98–111)
Creatinine, Ser: 1.34 mg/dL — ABNORMAL HIGH (ref 0.44–1.00)
GFR, Estimated: 44 mL/min — ABNORMAL LOW (ref >60)
Glucose, Bld: 401 mg/dL — ABNORMAL HIGH (ref 70–99)
Potassium: 3.8 mmol/L (ref 3.5–5.1)
Sodium: 133 mmol/L — ABNORMAL LOW (ref 135–145)
Total Bilirubin: 1.1 mg/dL (ref 0.0–1.2)
Total Protein: 9.1 g/dL — ABNORMAL HIGH (ref 6.5–8.1)

## 2024-06-21 LAB — CBG MONITORING, ED
Glucose-Capillary: 362 mg/dL — ABNORMAL HIGH (ref 70–99)
Glucose-Capillary: 283 mg/dL — ABNORMAL HIGH (ref 70–99)
Glucose-Capillary: 198 mg/dL — ABNORMAL HIGH (ref 70–99)
Glucose-Capillary: 216 mg/dL — ABNORMAL HIGH (ref 70–99)
Glucose-Capillary: 184 mg/dL — ABNORMAL HIGH (ref 70–99)
Glucose-Capillary: 228 mg/dL — ABNORMAL HIGH (ref 70–99)
Glucose-Capillary: 204 mg/dL — ABNORMAL HIGH (ref 70–99)
Glucose-Capillary: 209 mg/dL — ABNORMAL HIGH (ref 70–99)
Glucose-Capillary: 253 mg/dL — ABNORMAL HIGH (ref 70–99)

## 2024-06-21 LAB — BASIC METABOLIC PANEL WITH GFR
Anion gap: 16 — ABNORMAL HIGH (ref 5–15)
Anion gap: 16 — ABNORMAL HIGH (ref 5–15)
BUN: 16 mg/dL (ref 8–23)
BUN: 15 mg/dL (ref 8–23)
CO2: 20 mmol/L — ABNORMAL LOW (ref 22–32)
CO2: 20 mmol/L — ABNORMAL LOW (ref 22–32)
Calcium: 8.3 mg/dL — ABNORMAL LOW (ref 8.9–10.3)
Calcium: 7.9 mg/dL — ABNORMAL LOW (ref 8.9–10.3)
Chloride: 95 mmol/L — ABNORMAL LOW (ref 98–111)
Chloride: 99 mmol/L (ref 98–111)
Creatinine, Ser: 1.27 mg/dL — ABNORMAL HIGH (ref 0.44–1.00)
Creatinine, Ser: 1.09 mg/dL — ABNORMAL HIGH (ref 0.44–1.00)
GFR, Estimated: 47 mL/min — ABNORMAL LOW (ref >60)
GFR, Estimated: 56 mL/min — ABNORMAL LOW (ref >60)
Glucose, Bld: 395 mg/dL — ABNORMAL HIGH (ref 70–99)
Glucose, Bld: 177 mg/dL — ABNORMAL HIGH (ref 70–99)
Potassium: 4.3 mmol/L (ref 3.5–5.1)
Potassium: 3.7 mmol/L (ref 3.5–5.1)
Sodium: 131 mmol/L — ABNORMAL LOW (ref 135–145)
Sodium: 135 mmol/L (ref 135–145)

## 2024-06-21 LAB — BETA-HYDROXYBUTYRIC ACID
Beta-Hydroxybutyric Acid: 0.35 mmol/L — ABNORMAL HIGH (ref 0.05–0.27)
Beta-Hydroxybutyric Acid: 0.06 mmol/L (ref 0.05–0.27)

## 2024-06-21 LAB — RESP PANEL BY RT-PCR (RSV, FLU A&B, COVID)  RVPGX2
Influenza A by PCR: NEGATIVE
Influenza B by PCR: NEGATIVE
Resp Syncytial Virus by PCR: NEGATIVE
SARS Coronavirus 2 by RT PCR: NEGATIVE

## 2024-06-21 LAB — HIV ANTIBODY (ROUTINE TESTING W REFLEX): HIV Screen 4th Generation wRfx: NONREACTIVE

## 2024-06-21 LAB — BLOOD GAS, VENOUS

## 2024-06-21 LAB — HEMOGLOBIN A1C
Hgb A1c MFr Bld: 10.7 % — ABNORMAL HIGH (ref 4.8–5.6)
Mean Plasma Glucose: 260.39 mg/dL

## 2024-06-21 LAB — LIPASE, BLOOD: Lipase: 21 U/L (ref 11–51)

## 2024-06-21 MED ORDER — POTASSIUM CHLORIDE 10 MEQ/100ML IV SOLN
10.0000 meq | INTRAVENOUS | Status: AC
  Administered 2024-06-21 (×2): 10 meq via INTRAVENOUS
  Filled 2024-06-21: qty 100

## 2024-06-21 MED ORDER — INSULIN REGULAR(HUMAN) IN NACL 100-0.9 UT/100ML-% IV SOLN
INTRAVENOUS | Status: DC
  Administered 2024-06-21: 13 [IU]/h via INTRAVENOUS
  Filled 2024-06-21: qty 100

## 2024-06-21 MED ORDER — IPRATROPIUM-ALBUTEROL 0.5-2.5 (3) MG/3ML IN SOLN: 3.0000 mL | Freq: Four times a day (QID) | RESPIRATORY_TRACT | Status: DC | PRN

## 2024-06-21 MED ORDER — LACTATED RINGERS IV BOLUS
1000.0000 mL | Freq: Once | INTRAVENOUS | Status: AC
  Administered 2024-06-21: 1000 mL via INTRAVENOUS

## 2024-06-21 MED ORDER — IOHEXOL 300 MG/ML  SOLN
100.0000 mL | Freq: Once | INTRAMUSCULAR | Status: AC | PRN
  Administered 2024-06-21: 80 mL via INTRAVENOUS

## 2024-06-21 MED ORDER — LACTATED RINGERS IV SOLN: INTRAVENOUS | Status: DC

## 2024-06-21 MED ORDER — INSULIN REGULAR(HUMAN) IN NACL 100-0.9 UT/100ML-% IV SOLN: INTRAVENOUS | Status: DC

## 2024-06-21 MED ORDER — INSULIN GLARGINE-YFGN 100 UNIT/ML ~~LOC~~ SOLN
5.0000 [IU] | Freq: Every day | SUBCUTANEOUS | Status: DC
  Filled 2024-06-21: qty 0.05

## 2024-06-21 MED ORDER — DEXTROSE 50 % IV SOLN: 0.0000 mL | INTRAVENOUS | Status: DC | PRN

## 2024-06-21 MED ORDER — INSULIN GLARGINE-YFGN 100 UNIT/ML ~~LOC~~ SOLN
5.0000 [IU] | Freq: Once | SUBCUTANEOUS | Status: AC
  Administered 2024-06-21: 5 [IU] via SUBCUTANEOUS
  Filled 2024-06-21: qty 0.05

## 2024-06-21 MED ORDER — ENOXAPARIN SODIUM 40 MG/0.4ML IJ SOSY: 40.0000 mg | PREFILLED_SYRINGE | INTRAMUSCULAR | Status: DC

## 2024-06-21 MED ORDER — LACTATED RINGERS IV BOLUS: 20.0000 mL/kg | Freq: Once | INTRAVENOUS | Status: DC

## 2024-06-21 MED ORDER — HYDROMORPHONE HCL 1 MG/ML IJ SOLN
0.5000 mg | INTRAMUSCULAR | Status: DC | PRN
  Administered 2024-06-21 – 2024-06-22 (×5): 0.5 mg via INTRAVENOUS
  Filled 2024-06-21 (×5): qty 0.5

## 2024-06-21 MED ORDER — ONDANSETRON HCL 4 MG/2ML IJ SOLN
4.0000 mg | Freq: Four times a day (QID) | INTRAMUSCULAR | Status: DC | PRN
Start: 2024-06-21 — End: 2024-06-26

## 2024-06-21 MED ORDER — LACTATED RINGERS IV BOLUS
20.0000 mL/kg | Freq: Once | INTRAVENOUS | Status: AC
  Administered 2024-06-21: 2012 mL via INTRAVENOUS

## 2024-06-21 MED ORDER — DEXTROSE IN LACTATED RINGERS 5 % IV SOLN: INTRAVENOUS | Status: DC

## 2024-06-21 MED ORDER — KETOROLAC TROMETHAMINE 30 MG/ML IJ SOLN
15.0000 mg | Freq: Once | INTRAMUSCULAR | Status: AC
  Administered 2024-06-21: 15 mg via INTRAVENOUS
  Filled 2024-06-21: qty 1

## 2024-06-21 MED ORDER — PIPERACILLIN-TAZOBACTAM 3.375 G IVPB
3.3750 g | Freq: Three times a day (TID) | INTRAVENOUS | Status: DC
  Administered 2024-06-21 – 2024-06-22 (×3): 3.375 g via INTRAVENOUS
  Filled 2024-06-21 (×3): qty 50

## 2024-06-21 MED ORDER — FONDAPARINUX SODIUM 2.5 MG/0.5ML ~~LOC~~ SOLN: 2.5000 mg | SUBCUTANEOUS | Status: DC

## 2024-06-21 MED ORDER — OXYCODONE HCL 5 MG PO TABS
5.0000 mg | ORAL_TABLET | Freq: Four times a day (QID) | ORAL | Status: DC | PRN
  Administered 2024-06-22 – 2024-06-25 (×7): 5 mg via ORAL
  Filled 2024-06-21 (×7): qty 1

## 2024-06-21 MED ORDER — POTASSIUM CHLORIDE 10 MEQ/100ML IV SOLN
10.0000 meq | INTRAVENOUS | Status: DC
  Filled 2024-06-21: qty 100

## 2024-06-21 MED ORDER — ACETAMINOPHEN 325 MG PO TABS
650.0000 mg | ORAL_TABLET | Freq: Once | ORAL | Status: AC | PRN
  Administered 2024-06-21: 650 mg via ORAL
  Filled 2024-06-21: qty 2

## 2024-06-21 MED ORDER — ONDANSETRON HCL 4 MG PO TABS: 4.0000 mg | ORAL_TABLET | Freq: Four times a day (QID) | ORAL | Status: DC | PRN

## 2024-06-21 MED ORDER — PANTOPRAZOLE SODIUM 40 MG IV SOLR
40.0000 mg | Freq: Two times a day (BID) | INTRAVENOUS | Status: DC
  Administered 2024-06-21 – 2024-06-25 (×8): 40 mg via INTRAVENOUS
  Filled 2024-06-21 (×9): qty 10

## 2024-06-21 MED ORDER — NOREPINEPHRINE 4 MG/250ML-% IV SOLN
0.0000 ug/min | INTRAVENOUS | Status: DC
  Administered 2024-06-21: 2 ug/min via INTRAVENOUS
  Filled 2024-06-21: qty 250

## 2024-06-21 MED ORDER — INSULIN ASPART 100 UNIT/ML IJ SOLN
0.0000 [IU] | INTRAMUSCULAR | Status: DC
  Administered 2024-06-21: 3 [IU] via SUBCUTANEOUS
  Administered 2024-06-22 (×2): 1 [IU] via SUBCUTANEOUS
  Administered 2024-06-22: 2 [IU] via SUBCUTANEOUS
  Administered 2024-06-22 – 2024-06-24 (×6): 1 [IU] via SUBCUTANEOUS
  Administered 2024-06-24: 3 [IU] via SUBCUTANEOUS
  Administered 2024-06-24 (×2): 2 [IU] via SUBCUTANEOUS
  Administered 2024-06-24 – 2024-06-25 (×2): 1 [IU] via SUBCUTANEOUS
  Filled 2024-06-21 (×2): qty 1
  Filled 2024-06-21 (×2): qty 2
  Filled 2024-06-21 (×2): qty 1
  Filled 2024-06-21 (×2): qty 3
  Filled 2024-06-21 (×7): qty 1

## 2024-06-21 MED ORDER — SODIUM CHLORIDE 0.9 % IV SOLN: 250.0000 mL | INTRAVENOUS | Status: AC

## 2024-06-21 NOTE — ED Notes (Signed)
 Requested vasopressor infusion due to persistent hypotension after 3L of LR.

## 2024-06-21 NOTE — Consult Note (Signed)
 NAME:  Yolanda Gray, MRN:  [id number], DOB:  October 10, 1955, LOS: 0 ADMISSION DATE:  06/21/2024, CONSULTATION DATE:  06/21/2024 REFERRING MD:  Dr. Roann, CHIEF COMPLAINT:  Diffuse Abdominal Pain    Brief Pt Description / Synopsis:  68 y.o. female admitted with Severe Sepsis with Septic Shock due to complicated diverticulitis with pericolonic abscess, along with UTI.   History of Present Illness:  Yolanda Gray is a 68 y.o. female with a past medical history significant for obesity, COPD, ongoing active smoking, and polycythemia who presented to [facility] ED on 06/21/2024 for evaluation of generalized abdominal pain x 1 day. Patient reports experiencing intermittent right lower quadrant pain for about one month. However, the pain has intensified, is now generalized and constant.  Pain has been associated with constipation and nausea, no vomiting.  She did have a bowel movement this morning. Denies any blood in the stool.  Patient admits to taking ibuprofen and BC powder with no improvement.  Patient endorses subjective fevers and chills. Patient denied any chest pain, shortness of breath, nausea, vomiting, diarrhea.     ED Course: Initial Vital Signs: Temperature 100.4 F orally, BP 104/56, pulse 62, RR 18, SpO2 94% on room air  Significant Labs: Na 133, bicarb 16, glucose 401, Creatinine 1.34, Anion gap 25, WBC 9.1, lactic acid 4.9, beta-hydroxybutric acid 0.35 Imaging Chest X-ray>>IMPRESSION: Cardiomegaly with central vascular congestion. No focal consolidation. CT Abdomen & Pelvis with contrast>>IMPRESSION: 1. Acute sigmoid diverticulitis with a 5.3 x 4.0 cm diverticular abscess in the right hemipelvis. 2. Sigmoid-sigmoid fistula. 3. Mildly dilated bowel loop in the left hemiabdomen, likely a reactive ileus. Developing obstruction is less likely but not excluded. 4. Fatty liver. 5. Uterine fibroids. 6.  Aortic Atherosclerosis Medications Administered: 3L LR boluses, Zosyn, insulin drip initiated    TRH asked to admit for further workup and treatment.  While awaiting bed placement in the ED, she became hypotensive despite IV fluid resuscitation requiring initiation of peripheral Levophed.  PCCM consulted.  Please see Significant Hospital Events section below for full detailed hospital course.   Pertinent  Medical History   Past Medical History:  Diagnosis Date   COPD (chronic obstructive pulmonary disease) (HCC)    Edentulous    Morbid obesity with BMI of 40.0-44.9, adult (HCC)    Obesity (BMI 30-39.9)    Polycythemia    Thrombocytopenia    Tobacco abuse    Tobacco abuse     Micro Data:  11/10: Blood cultures x2>> 11/10: COVID/FLU/RSV PCR>> negative  11/10: Urine>>  Antimicrobials:   Anti-infectives (From admission, onward)    Start     Dose/Rate Route Frequency Ordered Stop   06/21/24 1415  piperacillin-tazobactam (ZOSYN) IVPB 3.375 g        3.375 g 12.5 mL/hr over 240 Minutes Intravenous Every 8 hours 06/21/24 1400         Significant Hospital Events: Including procedures, antibiotic start and stop dates in addition to other pertinent events   11/10: Presented to ED with diffuse abdominal pain, found to have complicated diverticulitis with pericolonic abscess.  TRH asked to admit, General Surgery consulted, IR asked to perform diverticular abscess drain placement, however holding for now due to Fondaparinux administration earlier today.  Later in the evening hypotensive despite IV fluids, peripheral Levophed started.  PCCM consulted.   Interim History / Subjective:  As outlined above under Significant Hospital Events section  Objective   Blood pressure (!) 87/53, pulse 90, temperature 97.7 F (36.5 C), temperature source Oral,  resp. rate (!) 30, height 5' 3 (1.6 m), weight 100.6 kg, SpO2 94%.        Intake/Output Summary (Last 24 hours) at 06/21/2024 1813 Last data filed at 06/21/2024 1701 Gross per 24 hour  Intake 3262 ml  Output --  Net 3262 ml    Filed Weights   06/21/24 1254  Weight: 100.6 kg    Examination: General: Acutely ill-appearing obese female, laying in bed, on room air, with abdominal pain HENT: Atraumatic, normocephalic, neck supple, no JVD Lungs: Breath sounds clear and diminished throughout, even, mild tachypnea Cardiovascular: Tachycardia, regular rhythm, S1-S2, no murmurs, rubs, gallops Abdomen: Obese, distended, tender to palpation, no guarding, bowel sounds hypoactive Extremities: Normal bulk and tone, no deformities, no edema Neuro: Awake and alert, oriented x 4, moves all extremities command, no focal deficits, speech clear, PERRLA GU: Deferred  Resolved Hospital Problem list     Assessment & Plan:   #Shock: Septic + Hypovolemic  -Continuous cardiac monitoring -Maintain MAP >65 -IV fluids -Vasopressors as needed to maintain MAP goal -Trend lactic acid until normalized -Consider Echocardiogram   #Severe Sepsis  #Diverticulitis with Pericolonic Abscess #UTI -Monitor fever curve -Trend WBC's & Procalcitonin -Follow cultures as above -Continue empiric Zosyn pending cultures & sensitivities -General Surgery following, appreciate input  -IR consulted for drain placement   #AKI #Anion Gap Metabolic Acidosis due to lactic acidosis -Monitor I&O's / urinary output -Follow BMP -Ensure adequate renal perfusion -Avoid nephrotoxic agents as able -Replace electrolytes as indicated ~ Pharmacy following for assistance with electrolyte replacement -IV fluids   #Mild DKA ~ RESOLVED -CBG's q4h; Target range of 140 to 180 -SSI & long acting insulin  -Follow ICU Hypo/Hyperglycemia protocol       Best Practice (right click and Reselect all SmartList Selections daily)   Diet/type: NPO DVT prophylaxis: SCD GI prophylaxis: PPI Lines: N/A Foley:  N/A Code Status:  full code Last date of multidisciplinary goals of care discussion [N/A]  11/10: Pt updated at bedside on plan of care.  Labs    CBC: Recent Labs  Lab 06/21/24 1017  WBC 9.1  HGB 13.6  HCT 44.1  MCV 90.9  PLT 311    Basic Metabolic Panel: Recent Labs  Lab 06/21/24 1017 06/21/24 1220 06/21/24 1553  NA 133* 131* 135  K 3.8 4.3 3.7  CL 92* 95* 99  CO2 16* 20* 20*  GLUCOSE 401* 395* 177*  BUN 15 16 15   CREATININE 1.34* 1.27* 1.09*  CALCIUM 8.7* 8.3* 7.9*   GFR: Estimated Creatinine Clearance: 57.5 mL/min (A) (by C-G formula based on SCr of 1.09 mg/dL (H)). Recent Labs  Lab 06/21/24 1017 06/21/24 1400 06/21/24 1553  WBC 9.1  --   --   LATICACIDVEN  --  4.9* 8.0*    Liver Function Tests: Recent Labs  Lab 06/21/24 1017  AST 40  ALT 19  ALKPHOS 85  BILITOT 1.1  PROT 9.1*  ALBUMIN 3.5   Recent Labs  Lab 06/21/24 1017  LIPASE 21   No results for input(s): AMMONIA in the last 168 hours.  ABG    Component Value Date/Time   HCO3 21.9 06/21/2024 1220   ACIDBASEDEF 1.7 06/21/2024 1220   O2SAT 83.7 06/21/2024 1220     Coagulation Profile: No results for input(s): INR, PROTIME in the last 168 hours.  Cardiac Enzymes: No results for input(s): CKTOTAL, CKMB, CKMBINDEX, TROPONINI in the last 168 hours.  HbA1C: No results found for: HGBA1C  CBG: Recent Labs  Lab 06/21/24  1315 06/21/24 1416 06/21/24 1518 06/21/24 1609 06/21/24 1713  GLUCAP 362* 283* 198* 216* 184*    Review of Systems:   Positives in BOLD: Gen: Denies fever, chills, weight change, fatigue, night sweats HEENT: Denies blurred vision, double vision, hearing loss, tinnitus, sinus congestion, rhinorrhea, sore throat, neck stiffness, dysphagia PULM: Denies shortness of breath, cough, sputum production, hemoptysis, wheezing CV: Denies chest pain, edema, orthopnea, paroxysmal nocturnal dyspnea, palpitations GI: Denies abdominal pain, nausea, vomiting, diarrhea, hematochezia, melena, constipation, change in bowel habits GU: Denies dysuria, hematuria, polyuria, oliguria, urethral discharge Endocrine:  Denies hot or cold intolerance, polyuria, polyphagia or appetite change Derm: Denies rash, dry skin, scaling or peeling skin change Heme: Denies easy bruising, bleeding, bleeding gums Neuro: Denies headache, numbness, weakness, slurred speech, loss of memory or consciousness   Past Medical History:  She,  has a past medical history of COPD (chronic obstructive pulmonary disease) (HCC), Edentulous, Morbid obesity with BMI of 40.0-44.9, adult (HCC), Obesity (BMI 30-39.9), Polycythemia, Thrombocytopenia, Tobacco abuse, and Tobacco abuse.   Surgical History:   Past Surgical History:  Procedure Laterality Date   CATARACT EXTRACTION W/PHACO Left 10/09/2023   Procedure: CATARACT EXTRACTION PHACO AND INTRAOCULAR LENS PLACEMENT (IOC) LEFT MALYUGIN OMIDRIA  13.44 01:09.5;  Surgeon: Enola Feliciano Hugger, MD;  Location: [facility];  Service: Ophthalmology;  Laterality: Left;   CATARACT EXTRACTION W/PHACO Right 10/30/2023   Procedure: PHACOEMULSIFICATION, CATARACT, WITH IOL INSERTION 6.65, 00:53.8;  Surgeon: Enola Feliciano Hugger, MD;  Location: [facility];  Service: Ophthalmology;  Laterality: Right;   CESAREAN SECTION     TONSILLECTOMY       Social History:   reports that she quit smoking about 3 years ago. Her smoking use included cigarettes. She has never used smokeless tobacco. She reports that she does not currently use alcohol. She reports that she does not use drugs.   Family History:  Her family history includes Asthma in her mother; Heart attack in her father and mother.   Allergies Allergies  Allergen Reactions   Porcine (Pork) Protein-Containing Drug Products      Home Medications  Prior to Admission medications   Not on File     Critical care time: 55 minutes     Inge Lecher, AGACNP-BC [facility] Prefer epic messenger for cross cover needs If after hours, please call E-link

## 2024-06-21 NOTE — Progress Notes (Signed)
  IR BRIEF PROGRESS NOTE:  IR was requested for diverticular abscess drain placement. Due to Fondaparinux administration at 13:40 today, IR will have to observe a minimum of 48 hour washout prior to proceeding. Care team would like to attempt an alternate plan. IR will therefore hold current order until advised by Care Team to delete, or proceed. IR recommends continued hold of Fondaparinux if this service is to intervene. Should patient's need become urgent while anticoagulated, IR may accept increase bleeding risk after risk/benefit discussion with Care Team. Order currently on Hold.   Electronically Signed: Carlin DELENA Griffon, PA-C 06/21/2024, 4:37 PM

## 2024-06-21 NOTE — ED Notes (Signed)
 Insulin drip stopped at this time per Dr Isadora.

## 2024-06-21 NOTE — ED Notes (Signed)
 Dr Roann notified of BP 92/54 in R arm. Will continue to monitor. No additional orders at this time.

## 2024-06-21 NOTE — H&P (Addendum)
 History and Physical    MELISS Gray FMW:[id number] DOB: Jun 22, 1956 DOA: 06/21/2024  DOS: the patient was seen and examined on 06/21/2024  PCP: Pcp, No   Patient coming from: Home  I have personally briefly reviewed patient's old medical records in [facility]  Chief Complaint: Abdominal pain diffuse lower abdomen worsening today  HPI: Yolanda Gray is a pleasant 68 y.o. female with medical history significant for obesity, COPD not on home oxygen, ongoing active smoking, polycythemia who came into ED from home complaining of right lower quadrant abdominal pain intermittent for 2 months.  Patient is stated that since yesterday pain has been generalized in her whole abdomen.  She has a history of constipation and last night she had a bowel movement at 2 AM which was hard and difficult to pass.  Patient reports that patient was having chills today.  Patient denied any chest pain, shortness of breath, nausea, vomiting, diarrhea.  ED Course: Upon arrival to the ED, patient is found to be hypotensive, febrile at 200.4 F, blood glucose over 401, gap of 25, creatinine of 1.34, normal white count at 9.1.  Hospitalist service was consulted for evaluation for admission for DKA and new onset of diabetes.  While being evaluated CT scan of the abdomen was done which showed Acute sigmoid diverticulitis with a 5.3 x 4.0 cm diverticular abscess in the right hemipelvis.  Patient also has sigmoid sigmoid fistula.  Blood cultures have been ordered, Zosyn has been started, surgical consult have been requested  Review of Systems:  ROS  All other systems negative except as noted in the HPI.  Past Medical History:  Diagnosis Date   COPD (chronic obstructive pulmonary disease) (HCC)    Edentulous    Morbid obesity with BMI of 40.0-44.9, adult (HCC)    Obesity (BMI 30-39.9)    Polycythemia    Thrombocytopenia    Tobacco abuse    Tobacco abuse     Past Surgical History:  Procedure Laterality Date    CATARACT EXTRACTION W/PHACO Left 10/09/2023   Procedure: CATARACT EXTRACTION PHACO AND INTRAOCULAR LENS PLACEMENT (IOC) LEFT MALYUGIN OMIDRIA  13.44 01:09.5;  Surgeon: Enola Feliciano Hugger, MD;  Location: [facility];  Service: Ophthalmology;  Laterality: Left;   CATARACT EXTRACTION W/PHACO Right 10/30/2023   Procedure: PHACOEMULSIFICATION, CATARACT, WITH IOL INSERTION 6.65, 00:53.8;  Surgeon: Enola Feliciano Hugger, MD;  Location: [facility];  Service: Ophthalmology;  Laterality: Right;   CESAREAN SECTION     TONSILLECTOMY       reports that she quit smoking about 3 years ago. Her smoking use included cigarettes. She has never used smokeless tobacco. She reports that she does not currently use alcohol. She reports that she does not use drugs.  Allergies  Allergen Reactions   Porcine (Pork) Protein-Containing Drug Products     Family History  Problem Relation Age of Onset   Asthma Mother    Heart attack Mother    Heart attack Father     Prior to Admission medications   Not on File    Physical Exam: Vitals:   06/21/24 1300 06/21/24 1325 06/21/24 1330 06/21/24 1343  BP: (!) 70/55 (!) 75/58 (!) 66/37 (!) 92/54  Pulse: (!) 141 (!) 160 (!) 149   Resp: (!) 27 (!) 33 (!) 28   Temp:      TempSrc:      SpO2: 95% 95% 93%   Weight:      Height:  Physical Exam   Constitutional: Alert, awake, calm, comfortable HEENT: Neck supple Respiratory: Clear to auscultation B/L, no wheezing, no rales.  Cardiovascular: Regular rate and rhythm, no murmurs / rubs / gallops. No extremity edema. 2+ pedal pulses. No carotid bruits.  Abdomen: Diffuse abdominal tenderness mainly on the right lower quadrant, decreased bowel sounds, obese abdomen Musculoskeletal: no clubbing / cyanosis. Good ROM, no contractures. Normal muscle tone.  Skin: no rashes, lesions, ulcers. Neurologic: CN 2-12 grossly intact. Sensation intact, No focal deficit identified Psychiatric: Alert and oriented  x 3. Normal mood.    Labs on Admission: I have personally reviewed following labs and imaging studies  CBC: Recent Labs  Lab 06/21/24 1017  WBC 9.1  HGB 13.6  HCT 44.1  MCV 90.9  PLT 311   Basic Metabolic Panel: Recent Labs  Lab 06/21/24 1017 06/21/24 1220  NA 133* 131*  K 3.8 4.3  CL 92* 95*  CO2 16* 20*  GLUCOSE 401* 395*  BUN 15 16  CREATININE 1.34* 1.27*  CALCIUM 8.7* 8.3*   GFR: Estimated Creatinine Clearance: 49.3 mL/min (A) (by C-G formula based on SCr of 1.27 mg/dL (H)). Liver Function Tests: Recent Labs  Lab 06/21/24 1017  AST 40  ALT 19  ALKPHOS 85  BILITOT 1.1  PROT 9.1*  ALBUMIN 3.5   Recent Labs  Lab 06/21/24 1017  LIPASE 21   No results for input(s): AMMONIA in the last 168 hours. Coagulation Profile: No results for input(s): INR, PROTIME in the last 168 hours. Cardiac Enzymes: No results for input(s): CKTOTAL, CKMB, CKMBINDEX, TROPONINI, TROPONINIHS in the last 168 hours. BNP (last 3 results) No results for input(s): BNP in the last 8760 hours. HbA1C: No results for input(s): HGBA1C in the last 72 hours. CBG: Recent Labs  Lab 06/21/24 1315  GLUCAP 362*   Lipid Profile: No results for input(s): CHOL, HDL, LDLCALC, TRIG, CHOLHDL, LDLDIRECT in the last 72 hours. Thyroid Function Tests: No results for input(s): TSH, T4TOTAL, FREET4, T3FREE, THYROIDAB in the last 72 hours. Anemia Panel: No results for input(s): VITAMINB12, FOLATE, FERRITIN, TIBC, IRON, RETICCTPCT in the last 72 hours. Urine analysis: No results found for: COLORURINE, APPEARANCEUR, LABSPEC, PHURINE, GLUCOSEU, HGBUR, BILIRUBINUR, KETONESUR, PROTEINUR, UROBILINOGEN, NITRITE, LEUKOCYTESUR  Radiological Exams on Admission: I have personally reviewed images DG Chest Port 1 View Result Date: 06/21/2024 CLINICAL DATA:  Fever. EXAM: PORTABLE CHEST 1 VIEW COMPARISON:  Chest radiograph dated  09/22/2021. FINDINGS: There is cardiomegaly with central vascular congestion. No focal consolidation, pleural effusion, pneumothorax. No acute osseous pathology. IMPRESSION: Cardiomegaly with central vascular congestion. No focal consolidation. Electronically Signed   By: Vanetta Chou M.D.   On: 06/21/2024 13:28   CT ABDOMEN PELVIS W CONTRAST Result Date: 06/21/2024 CLINICAL DATA:  Right lower quadrant abdominal pain. EXAM: CT ABDOMEN AND PELVIS WITH CONTRAST TECHNIQUE: Multidetector CT imaging of the abdomen and pelvis was performed using the standard protocol following bolus administration of intravenous contrast. RADIATION DOSE REDUCTION: This exam was performed according to the departmental dose-optimization program which includes automated exposure control, adjustment of the mA and/or kV according to patient size and/or use of iterative reconstruction technique. CONTRAST:  80mL OMNIPAQUE IOHEXOL 300 MG/ML  SOLN COMPARISON:  None Available. FINDINGS: Lower chest: No acute abnormality. No intra-abdominal free air.  Small free fluid in the pelvis. Hepatobiliary: Fatty appearing liver. No biliary ductal dilatation. The gallbladder is unremarkable. Pancreas: Unremarkable. No pancreatic ductal dilatation or surrounding inflammatory changes. Spleen: Normal in size without focal abnormality. Adrenals/Urinary Tract: The  right adrenal glands unremarkable. There is a 1.5 cm indeterminate left adrenal nodule. There is no hydronephrosis on either side. The visualized ureters and urinary bladder appear unremarkable. Stomach/Bowel: There is severe sigmoid diverticulosis. There is active inflammatory changes consistent with acute diverticulitis. There is a 5.3 x 4.0 cm complex collection in the right hemipelvis medial to the right ovary containing fluid and air consistent with diverticular abscess. There is a linear tract extending from the distal sigmoid colon to a segment of the sigmoid colon more proximally (series  2 images 56-65) consistent with a fistula. Mildly dilated bowel loop in the left hemiabdomen measures 3.5 cm, likely a reactive ileus. Developing obstruction is less likely but not excluded. The appendix is not visualized with certainty. No inflammatory changes identified in the right lower quadrant. Vascular/Lymphatic: Moderate aortoiliac atherosclerotic disease. The IVC is unremarkable. No portal venous gas. There is no adenopathy. Reproductive: The uterus is anteverted. Uterine fibroids noted. No suspicious adnexal masses. Other: None Musculoskeletal: Degenerative changes of the spine. No acute osseous pathology. IMPRESSION: 1. Acute sigmoid diverticulitis with a 5.3 x 4.0 cm diverticular abscess in the right hemipelvis. 2. Sigmoid-sigmoid fistula. 3. Mildly dilated bowel loop in the left hemiabdomen, likely a reactive ileus. Developing obstruction is less likely but not excluded. 4. Fatty liver. 5. Uterine fibroids. 6.  Aortic Atherosclerosis (ICD10-I70.0). Electronically Signed   By: Vanetta Chou M.D.   On: 06/21/2024 13:12    EKG: Not available    Assessment/Plan Principal Problem:   DKA (diabetic ketoacidosis) (HCC) Active Problems:   Abscess of sigmoid colon due to diverticulitis   Severe sepsis (HCC)    Assessment and Plan: 68 year old female with history of polycythemia, COPD, obesity, ongoing tobacco abuse who came into ED complaining of diffuse abdominal pain.  Patient was found to have high blood sugar later CT scan of the abdomen showed sigmoid diverticular abscess.  1.  Diabetic ketoacidosis, new onset of diabetes - Patient will be admitted to stepdown unit - IV fluid bolus - Insulin drip, n.p.o., A1c - Pain medications with Dilaudid, total, oxycodone - Diabetic educator  2.  Diverticular perforation with abscess - Blood cultures, started on Zosyn - IV fluid, pain medications as above , n.p.o. - Surgical consult called  3.  Polycythemia/COPD - Continue to monitor -  Oxygen to maintain saturation more than 90% - DuoNebs nebulization as needed  4.  Tobacco abuse - Extensive counseling was done for smoking cessation   Addendum Fondaparinux was added for DVT prophylaxis.  Received 1 dose today.  However later it was found that patient has sigmoid diverticular abscess.  May need drainage or surgical procedure.  Fondaparinux has been discontinued.  Patient had persistent hypotension after 3 L of IV fluid.  Levophed has been ordered.  Continue to monitor in stepdown/ICU.   DVT prophylaxis: Fondaparinux Code Status: Full Code Family Communication: Family at bedside Disposition Plan: Home Consults called: General Surgery Admission status: Inpatient, Step Down Unit   Nena Rebel, MD [facility] 06/21/2024, 2:15 PM

## 2024-06-21 NOTE — ED Notes (Signed)
 Dr Roann notified of low BP despite 2 liters of LR infusing. He advised to check the other arm. Will continue to monitor.

## 2024-06-21 NOTE — ED Triage Notes (Signed)
 Pt to ED via POV from home. Pt ambulatory to triage. Pt reports left lower abd pain that has been intermittent x2 months. Pt reports today having chills. Denies CP, SOB, N/V/D.

## 2024-06-21 NOTE — ED Provider Notes (Signed)
 [facility] Provider Note   Event Date/Time   First MD Initiated Contact with Patient 06/21/24 1042     (approximate) History  Abdominal Pain  HPI Yolanda Gray is a 68 y.o. female with a past medical history of tobacco abuse, COPD, obesity, and polycythemia who presents complaining of right lower quadrant abdominal pain that is not radiated to the periumbilical region.  Patient states that she has had right lower quadrant abdominal pain intermittently over the last 2 months with associated constipation however over the last 24 hours this pain has now spread to my entire abdomen that is worse over the periumbilical region.  Patient states she is continue to have constipation however had a bowel movement at 2 AM last night that was hard and difficult to pass.  Patient endorses subjective fever/chills and had a temperature of 100.4 F in triage.  Patient denies any recent travel, sick contacts, or food out of the ordinary.  Patient denies any bloody stool.  Patient endorses nausea without vomiting.  Patient denies any exacerbating or relieving factors for this pain ROS: Patient currently denies any vision changes, tinnitus, difficulty speaking, facial droop, sore throat, chest pain, shortness of breath, vomiting/diarrhea, dysuria, or weakness/numbness/paresthesias in any extremity   Physical Exam  Triage Vital Signs: ED Triage Vitals  Encounter Vitals Group     BP 06/21/24 1017 (!) 104/56     Girls Systolic BP Percentile --      Girls Diastolic BP Percentile --      Boys Systolic BP Percentile --      Boys Diastolic BP Percentile --      Pulse Rate 06/21/24 1015 62     Resp 06/21/24 1015 18     Temp 06/21/24 1015 (!) 100.4 F (38 C)     Temp Source 06/21/24 1015 Oral     SpO2 06/21/24 1015 94 %     Weight --      Height --      Head Circumference --      Peak Flow --      Pain Score 06/21/24 1015 5     Pain Loc --      Pain Education --      Exclude from  Growth Chart --    Most recent vital signs: Vitals:   06/21/24 1015 06/21/24 1017  BP:  (!) 104/56  Pulse: 62   Resp: 18   Temp: (!) 100.4 F (38 C)   SpO2: 94%    General: Awake, oriented x4. CV:  Good peripheral perfusion. Resp:  Tachypneic.  Clear to auscultation bilaterally Abd:  No distention.  Tender to patient over all quadrants and worse on the right side Other:  Morbidly obese middle-aged Caucasian female resting comfortably in no acute distress ED Results / Procedures / Treatments  Labs (all labs ordered are listed, but only abnormal results are displayed) Labs Reviewed  COMPREHENSIVE METABOLIC PANEL WITH GFR - Abnormal; Notable for the following components:      Result Value   Sodium 133 (*)    Chloride 92 (*)    CO2 16 (*)    Glucose, Bld 401 (*)    Creatinine, Ser 1.34 (*)    Calcium 8.7 (*)    Total Protein 9.1 (*)    GFR, Estimated 44 (*)    Anion gap 25 (*)    All other components within normal limits  RESP PANEL BY RT-PCR (RSV, FLU A&B, COVID)  RVPGX2  LIPASE,  BLOOD  CBC  URINALYSIS, ROUTINE W REFLEX MICROSCOPIC  BLOOD GAS, VENOUS  BETA-HYDROXYBUTYRIC ACID  BASIC METABOLIC PANEL WITH GFR  BASIC METABOLIC PANEL WITH GFR  BASIC METABOLIC PANEL WITH GFR  BASIC METABOLIC PANEL WITH GFR  CBG MONITORING, ED  RADIOLOGY ED MD interpretation: Pending - All radiology independently interpreted and agree with radiology assessment Official radiology report(s): No results found. PROCEDURES: Critical Care performed: Yes, see critical care procedure note(s) .1-3 Lead EKG Interpretation  Performed by: Jossie Artist POUR, MD Authorized by: Jossie Artist POUR, MD     Interpretation: normal     ECG rate:  71   ECG rate assessment: normal     Rhythm: sinus rhythm     Ectopy: none     Conduction: normal   CRITICAL CARE Performed by: Rj Pedrosa K Supriya Beaston  Total critical care time: 37 minutes  Critical care time was exclusive of separately billable procedures and  treating other patients.  Critical care was necessary to treat or prevent imminent or life-threatening deterioration.  Critical care was time spent personally by me on the following activities: development of treatment plan with patient and/or surrogate as well as nursing, discussions with consultants, evaluation of patient's response to treatment, examination of patient, obtaining history from patient or surrogate, ordering and performing treatments and interventions, ordering and review of laboratory studies, ordering and review of radiographic studies, pulse oximetry and re-evaluation of patient's condition.  MEDICATIONS ORDERED IN ED: Medications  lactated ringers bolus 20 mL/kg (has no administration in time range)  insulin regular, human (MYXREDLIN) 100 units/ 100 mL infusion (has no administration in time range)  lactated ringers infusion (has no administration in time range)  dextrose 5 % in lactated ringers infusion (has no administration in time range)  dextrose 50 % solution 0-50 mL (has no administration in time range)  potassium chloride 10 mEq in 100 mL IVPB (has no administration in time range)  iohexol (OMNIPAQUE) 300 MG/ML solution 100 mL (has no administration in time range)  acetaminophen (TYLENOL) tablet 650 mg (650 mg Oral Given 06/21/24 1019)  ketorolac  (TORADOL ) 30 MG/ML injection 15 mg (15 mg Intravenous Given 06/21/24 1113)  lactated ringers bolus 1,000 mL (1,000 mLs Intravenous New Bag/Given 06/21/24 1113)   IMPRESSION / MDM / ASSESSMENT AND PLAN / ED COURSE  I reviewed the triage vital signs and the nursing notes.                             The patient is on the cardiac monitor to evaluate for evidence of arrhythmia and/or significant heart rate changes. Patient's presentation is most consistent with acute presentation with potential threat to life or bodily function. Patient is a 68 year old female with the above-stated past medical history presents complaining  of right lower quadrant/generalized abdominal pain in the setting of constipation and fever. DDx: Constipation, gastroenteritis, appendicitis, cholecystitis Plan: CBC, CMP, lipase, UA, RVP  CMP concerning for glucose of 401 with an anion gap of 25.  Have added a VBG and beta hydroxybutyrate.  Given the patient has signs and symptoms of DKA, will start IV insulin empirically.  Will supplement potassium as needed, and have contacted the internal medicine service, Dr. Paudel, for admission.  Patient agrees with plan for admission at this time  Dispo: Admit to medicine   FINAL CLINICAL IMPRESSION(S) / ED DIAGNOSES   Final diagnoses:  Diabetic ketoacidosis without coma associated with type 2 diabetes mellitus (HCC)  Hyperglycemia  AKI (acute kidney injury)  Polyuria  Generalized abdominal pain   Rx / DC Orders   ED Discharge Orders     None      Note:  This document was prepared using Dragon voice recognition software and may include unintentional dictation errors.   Nesbit Michon K, MD 06/21/24 1224

## 2024-06-21 NOTE — Progress Notes (Signed)
 IR was advised Fondaparinux was not given today. IR will attempt drain placement in AM. Patient made NPO at midnight.

## 2024-06-21 NOTE — Consult Note (Addendum)
 [facility]-General Surgery  SURGICAL CONSULTATION NOTE    HISTORY OF PRESENT ILLNESS (HPI):  68 y.o. female presented to [facility] ED today for evaluation of generalized abdominal pain x 1 day. Patient reports experiencing intermittent right lower quadrant pain for about one month. However, the pain has intensified, is now generalized and constant.  Pain has been associated with constipation and nausea, no vomiting.  She did have a bowel movement this morning. Denies any blood in the stool.  Patient admits to taking ibuprofen and BC powder with no improvement.  Patient endorses subjective fevers and chills. Had a temp of 100.4 F in triage.  No known alleviating or aggravating factors. No radiating pain. Prior abdominal surgeries include C-section.  In the ED, patient had a temp of 100.4 F, BP 104/56, HR 62, RR 18 and 94% SpO2. Initial labs do not indicate leukocytosis. CMP indicated normal LFTs, alkaline phosphatase, and total bilirubin. It also showed a glucose level of 401 with an anion gap of 25.  Patient also had some electrolyte disturbance and elevated creatinine level of 1.34.  Provider in the ED initiated DKA treatment and management. Lipase 21. Lactic acid still pending. 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  Surgery is consulted physician Dr. Paudel in this context for evaluation and management of acute complicated diverticulitis with abscess and fistula.  PAST MEDICAL HISTORY (PMH):  Past Medical History:  Diagnosis Date   COPD (chronic obstructive pulmonary disease) (HCC)    Edentulous    Morbid obesity with BMI of 40.0-44.9, adult (HCC)    Obesity (BMI 30-39.9)    Polycythemia    Thrombocytopenia    Tobacco abuse    Tobacco abuse      PAST SURGICAL HISTORY [facility]):  Past Surgical History:  Procedure Laterality Date   CATARACT EXTRACTION W/PHACO Left 10/09/2023    Procedure: CATARACT EXTRACTION PHACO AND INTRAOCULAR LENS PLACEMENT (IOC) LEFT MALYUGIN OMIDRIA  13.44 01:09.5;  Surgeon: Enola Feliciano Hugger, MD;  Location: [facility];  Service: Ophthalmology;  Laterality: Left;   CATARACT EXTRACTION W/PHACO Right 10/30/2023   Procedure: PHACOEMULSIFICATION, CATARACT, WITH IOL INSERTION 6.65, 00:53.8;  Surgeon: Enola Feliciano Hugger, MD;  Location: [facility];  Service: Ophthalmology;  Laterality: Right;   CESAREAN SECTION     TONSILLECTOMY       MEDICATIONS:  Prior to Admission medications   Not on File     ALLERGIES:  Allergies  Allergen Reactions   Porcine (Pork) Protein-Containing Drug Products      SOCIAL HISTORY:  Social History   Socioeconomic History   Marital status: Married    Spouse name: Not on file   Number of children: Not on file   Years of education: Not on file   Highest education level: Not on file  Occupational History   Not on file  Tobacco Use   Smoking status: Former    Current packs/day: 0.00    Types: Cigarettes    Quit date: 2022    Years since quitting: 3.8   Smokeless tobacco: Never  Vaping Use   Vaping status: Never Used  Substance and Sexual Activity   Alcohol use: Not Currently   Drug use: Never   Sexual activity: Not on file  Other Topics Concern   Not on file  Social History Narrative   Not on file   Social Drivers of Health   Financial Resource Strain: Not on file  Food Insecurity: Not on file  Transportation Needs: Not on file  Physical Activity: Not on file  Stress: Not on file  Social Connections: Not on file  Intimate Partner Violence: Not on file     FAMILY HISTORY:  Family History  Problem Relation Age of Onset   Asthma Mother    Heart attack Mother    Heart attack Father       REVIEW OF SYSTEMS:  Review of Systems  Constitutional:  Positive for chills and fever.  Respiratory:  Negative for cough and wheezing.   Cardiovascular:  Negative for chest pain  and palpitations.  Gastrointestinal:  Positive for abdominal pain, constipation and nausea. Negative for blood in stool, diarrhea and vomiting.    VITAL SIGNS:  Temp:  [100.4 F (38 C)] 100.4 F (38 C) (11/10 1015) Pulse Rate:  [62-179] 179 (11/10 1400) Resp:  [18-33] 28 (11/10 1400) BP: (66-104)/(37-69) 70/47 (11/10 1426) SpO2:  [93 %-95 %] 94 % (11/10 1400) Weight:  [100.6 kg] 100.6 kg (11/10 1254)     Height: 5' 3 (160 cm) Weight: 100.6 kg BMI (Calculated): 39.28   INTAKE/OUTPUT:  No intake/output data recorded.  PHYSICAL EXAM:  Physical Exam Constitutional:      Appearance: She is well-developed. She is obese.  HENT:     Head: Normocephalic and atraumatic.  Cardiovascular:     Rate and Rhythm: Regular rhythm. Tachycardia present.  Pulmonary:     Effort: Pulmonary effort is normal.     Breath sounds: Normal breath sounds.  Abdominal:     General: There is distension.     Palpations: Abdomen is soft.     Tenderness: There is generalized abdominal tenderness. There is guarding.  Neurological:     Mental Status: She is alert.     Labs:     Latest Ref Rng & Units 06/21/2024   10:17 AM 09/22/2021    2:40 PM  CBC  WBC 4.0 - 10.5 K/uL 9.1  8.3   Hemoglobin 12.0 - 15.0 g/dL 86.3  84.6   Hematocrit 36.0 - 46.0 % 44.1  49.6   Platelets 150 - 400 K/uL 311  149       Latest Ref Rng & Units 06/21/2024   12:20 PM 06/21/2024   10:17 AM 09/23/2021    4:46 AM  CMP  Glucose 70 - 99 mg/dL 604  598  868   BUN 8 - 23 mg/dL 16  15  11    Creatinine 0.44 - 1.00 mg/dL 8.72  8.65  9.32   Sodium 135 - 145 mmol/L 131  133  137   Potassium 3.5 - 5.1 mmol/L 4.3  3.8  4.6   Chloride 98 - 111 mmol/L 95  92  98   CO2 22 - 32 mmol/L 20  16  32   Calcium 8.9 - 10.3 mg/dL 8.3  8.7  8.1   Total Protein 6.5 - 8.1 g/dL  9.1  7.0   Total Bilirubin 0.0 - 1.2 mg/dL  1.1  0.5   Alkaline Phos 38 - 126 U/L  85  55   AST 15 - 41 U/L  40  12   ALT 0 - 44 U/L  19  13     Imaging studies:   CLINICAL DATA:  Right lower quadrant abdominal pain.   EXAM: CT ABDOMEN AND PELVIS WITH CONTRAST   TECHNIQUE: Multidetector CT imaging of the abdomen and pelvis was performed using the standard protocol following bolus administration of intravenous contrast.  RADIATION DOSE REDUCTION: This exam was performed according to the departmental dose-optimization program which includes automated exposure control, adjustment of the mA and/or kV according to patient size and/or use of iterative reconstruction technique.   CONTRAST:  80mL OMNIPAQUE IOHEXOL 300 MG/ML  SOLN   COMPARISON:  None Available.   FINDINGS: Lower chest: No acute abnormality.   No intra-abdominal free air.  Small free fluid in the pelvis.   Hepatobiliary: Fatty appearing liver. No biliary ductal dilatation. The gallbladder is unremarkable.   Pancreas: Unremarkable. No pancreatic ductal dilatation or surrounding inflammatory changes.   Spleen: Normal in size without focal abnormality.   Adrenals/Urinary Tract: The right adrenal glands unremarkable. There is a 1.5 cm indeterminate left adrenal nodule. There is no hydronephrosis on either side. The visualized ureters and urinary bladder appear unremarkable.   Stomach/Bowel: There is severe sigmoid diverticulosis. There is active inflammatory changes consistent with acute diverticulitis. There is a 5.3 x 4.0 cm complex collection in the right hemipelvis medial to the right ovary containing fluid and air consistent with diverticular abscess. There is a linear tract extending from the distal sigmoid colon to a segment of the sigmoid colon more proximally (series 2 images 56-65) consistent with a fistula. Mildly dilated bowel loop in the left hemiabdomen measures 3.5 cm, likely a reactive ileus. Developing obstruction is less likely but not excluded. The appendix is not visualized with certainty. No inflammatory changes identified in the right lower quadrant.    Vascular/Lymphatic: Moderate aortoiliac atherosclerotic disease. The IVC is unremarkable. No portal venous gas. There is no adenopathy.   Reproductive: The uterus is anteverted. Uterine fibroids noted. No suspicious adnexal masses.   Other: None   Musculoskeletal: Degenerative changes of the spine. No acute osseous pathology.   IMPRESSION: 1. Acute sigmoid diverticulitis with a 5.3 x 4.0 cm diverticular abscess in the right hemipelvis. 2. Sigmoid-sigmoid fistula. 3. Mildly dilated bowel loop in the left hemiabdomen, likely a reactive ileus. Developing obstruction is less likely but not excluded. 4. Fatty liver. 5. Uterine fibroids. 6.  Aortic Atherosclerosis (ICD10-I70.0).     Electronically Signed   By: Vanetta Chou M.D.   On: 06/21/2024 13:12   Assessment/Plan: 68 y.o. female with acute complicated diverticulitis with abscess and fistula, complicated by pertinent comorbidities including COPD, obesity polycythemia, and history of tobacco abuse.   - Given patient's acute complicated case of diverticulitis with abscess and DKA, would recommend attempting drain placement. Patient is tachycardic with soft blood pressure responding to IV fluids, latest 99/80.   - No acute findings on CT scan, no free air or free fluid.  The generalized abdominal pain could also be a symptom of DKA. Continue to stabilize patient per primary team recommendations.   -  Will discuss surgery with patient if abscess is not amenable for drainage or if symptoms do not improve or worsen. If that's the case, she would probably need a Hartmann's procedure with colostomy bag. Will discuss with patient and family in further when the time comes.   - NPO for possible procedure   - Continue pain management   Thank you for the opportunity to participate in this patient's care.   -- Gilmer Cea PA-C

## 2024-06-21 NOTE — ED Notes (Signed)
 Patient up to bedside recliner for comfort. Warm blanket provided.

## 2024-06-22 ENCOUNTER — Inpatient Hospital Stay

## 2024-06-22 DIAGNOSIS — I480 Paroxysmal atrial fibrillation: Secondary | ICD-10-CM

## 2024-06-22 DIAGNOSIS — E119 Type 2 diabetes mellitus without complications: Secondary | ICD-10-CM

## 2024-06-22 DIAGNOSIS — E1110 Type 2 diabetes mellitus with ketoacidosis without coma: Secondary | ICD-10-CM | POA: Diagnosis not present

## 2024-06-22 LAB — COMPREHENSIVE METABOLIC PANEL WITH GFR
ALT: 16 U/L (ref 0–44)
AST: 23 U/L (ref 15–41)
Albumin: 2.9 g/dL — ABNORMAL LOW (ref 3.5–5.0)
Alkaline Phosphatase: 53 U/L (ref 38–126)
Anion gap: 11 (ref 5–15)
BUN: 22 mg/dL (ref 8–23)
CO2: 23 mmol/L (ref 22–32)
Calcium: 8.2 mg/dL — ABNORMAL LOW (ref 8.9–10.3)
Chloride: 98 mmol/L (ref 98–111)
Creatinine, Ser: 0.97 mg/dL (ref 0.44–1.00)
GFR, Estimated: >60 mL/min (ref >60)
Glucose, Bld: 170 mg/dL — ABNORMAL HIGH (ref 70–99)
Potassium: 4.2 mmol/L (ref 3.5–5.1)
Sodium: 132 mmol/L — ABNORMAL LOW (ref 135–145)
Total Bilirubin: 0.9 mg/dL (ref 0.0–1.2)
Total Protein: 7 g/dL (ref 6.5–8.1)

## 2024-06-22 LAB — CBG MONITORING, ED
Glucose-Capillary: 217 mg/dL — ABNORMAL HIGH (ref 70–99)
Glucose-Capillary: 151 mg/dL — ABNORMAL HIGH (ref 70–99)
Glucose-Capillary: 161 mg/dL — ABNORMAL HIGH (ref 70–99)
Glucose-Capillary: 159 mg/dL — ABNORMAL HIGH (ref 70–99)

## 2024-06-22 LAB — URINALYSIS, W/ REFLEX TO CULTURE (INFECTION SUSPECTED)
Bilirubin Urine: NEGATIVE
Glucose, UA: >=500 mg/dL — AB
Hgb urine dipstick: NEGATIVE
Ketones, ur: NEGATIVE mg/dL
Nitrite: NEGATIVE
Protein, ur: >=300 mg/dL — AB
Specific Gravity, Urine: >1.046 — ABNORMAL HIGH (ref 1.005–1.030)
WBC, UA: >50 WBC/hpf (ref 0–5)
pH: 5 (ref 5.0–8.0)

## 2024-06-22 LAB — BLOOD GAS, VENOUS
Acid-base deficit: 1.7 mmol/L (ref 0.0–2.0)
Bicarbonate: 21.9 mmol/L (ref 20.0–28.0)
O2 Saturation: 83.7 %
Patient temperature: 37
pCO2, Ven: 33 mmHg — ABNORMAL LOW (ref 44–60)
pH, Ven: 7.43 (ref 7.25–7.43)
pO2, Ven: 54 mmHg — ABNORMAL HIGH (ref 32–45)

## 2024-06-22 LAB — TSH: TSH: 2.23 u[IU]/mL (ref 0.350–4.500)

## 2024-06-22 LAB — CBC
HCT: 33.2 % — ABNORMAL LOW (ref 36.0–46.0)
Hemoglobin: 10.5 g/dL — ABNORMAL LOW (ref 12.0–15.0)
MCH: 27.5 pg (ref 26.0–34.0)
MCHC: 31.6 g/dL (ref 30.0–36.0)
MCV: 86.9 fL (ref 80.0–100.0)
Platelets: 221 K/uL (ref 150–400)
RBC: 3.82 MIL/uL — ABNORMAL LOW (ref 3.87–5.11)
RDW: 13.6 % (ref 11.5–15.5)
WBC: 10.1 K/uL (ref 4.0–10.5)
nRBC: 0 % (ref 0.0–0.2)

## 2024-06-22 LAB — GLUCOSE, CAPILLARY
Glucose-Capillary: 172 mg/dL — ABNORMAL HIGH (ref 70–99)
Glucose-Capillary: 181 mg/dL — ABNORMAL HIGH (ref 70–99)
Glucose-Capillary: 183 mg/dL — ABNORMAL HIGH (ref 70–99)

## 2024-06-22 LAB — HEPATIC FUNCTION PANEL
ALT: 17 U/L (ref 0–44)
AST: 24 U/L (ref 15–41)
Albumin: 3.3 g/dL — ABNORMAL LOW (ref 3.5–5.0)
Alkaline Phosphatase: 81 U/L (ref 38–126)
Bilirubin, Direct: 0.3 mg/dL — ABNORMAL HIGH (ref 0.0–0.2)
Indirect Bilirubin: 0.2 mg/dL — ABNORMAL LOW (ref 0.3–0.9)
Total Bilirubin: 0.5 mg/dL (ref 0.0–1.2)
Total Protein: 7.1 g/dL (ref 6.5–8.1)

## 2024-06-22 LAB — LACTIC ACID, PLASMA: Lactic Acid, Venous: 1.2 mmol/L (ref 0.5–1.9)

## 2024-06-22 LAB — APTT: aPTT: 30 s (ref 24–36)

## 2024-06-22 MED ORDER — MIDAZOLAM HCL (PF) 2 MG/2ML IJ SOLN
INTRAMUSCULAR | Status: AC | PRN
  Administered 2024-06-22 (×2): 1 mg via INTRAVENOUS

## 2024-06-22 MED ORDER — SODIUM CHLORIDE 0.9% FLUSH
5.0000 mL | Freq: Three times a day (TID) | INTRAVENOUS | Status: DC
  Administered 2024-06-22 – 2024-06-26 (×12): 5 mL

## 2024-06-22 MED ORDER — INSULIN GLARGINE-YFGN 100 UNIT/ML ~~LOC~~ SOLN
10.0000 [IU] | Freq: Two times a day (BID) | SUBCUTANEOUS | Status: DC
  Administered 2024-06-22 – 2024-06-25 (×7): 10 [IU] via SUBCUTANEOUS
  Filled 2024-06-22 (×9): qty 0.1

## 2024-06-22 MED ORDER — MIDAZOLAM HCL 2 MG/2ML IJ SOLN
INTRAMUSCULAR | Status: AC
  Filled 2024-06-22: qty 2

## 2024-06-22 MED ORDER — SODIUM CHLORIDE 0.9 % IV BOLUS
1000.0000 mL | Freq: Once | INTRAVENOUS | Status: AC
  Administered 2024-06-22: 1000 mL via INTRAVENOUS

## 2024-06-22 MED ORDER — METOPROLOL TARTRATE 25 MG PO TABS
25.0000 mg | ORAL_TABLET | Freq: Four times a day (QID) | ORAL | Status: DC
  Administered 2024-06-22 – 2024-06-23 (×3): 25 mg via ORAL
  Filled 2024-06-22 (×3): qty 1

## 2024-06-22 MED ORDER — FENTANYL CITRATE (PF) 100 MCG/2ML IJ SOLN
INTRAMUSCULAR | Status: AC
  Filled 2024-06-22: qty 2

## 2024-06-22 MED ORDER — LACTATED RINGERS IV SOLN: INTRAVENOUS | Status: AC

## 2024-06-22 MED ORDER — LIVING WELL WITH DIABETES BOOK
Freq: Once | Status: AC
  Filled 2024-06-22: qty 1

## 2024-06-22 MED ORDER — FENTANYL CITRATE (PF) 100 MCG/2ML IJ SOLN
INTRAMUSCULAR | Status: AC | PRN
  Administered 2024-06-22 (×2): 25 ug via INTRAVENOUS

## 2024-06-22 MED ORDER — DIGOXIN 0.25 MG/ML IJ SOLN
0.2500 mg | INTRAMUSCULAR | Status: DC | PRN
Start: 2024-06-22 — End: 2024-06-23
  Administered 2024-06-22: 0.25 mg via INTRAVENOUS
  Filled 2024-06-22: qty 2

## 2024-06-22 MED ORDER — ACETAMINOPHEN 325 MG PO TABS
975.0000 mg | ORAL_TABLET | Freq: Four times a day (QID) | ORAL | Status: DC | PRN
  Administered 2024-06-22: 975 mg via ORAL
  Filled 2024-06-22: qty 3

## 2024-06-22 MED ORDER — INSULIN STARTER KIT- PEN NEEDLES (ENGLISH)
1.0000 | Freq: Once | Status: AC
  Administered 2024-06-22: 1
  Filled 2024-06-22: qty 1

## 2024-06-22 MED ORDER — PIPERACILLIN-TAZOBACTAM 3.375 G IVPB
3.3750 g | Freq: Three times a day (TID) | INTRAVENOUS | Status: DC
  Administered 2024-06-22 – 2024-06-25 (×8): 3.375 g via INTRAVENOUS
  Filled 2024-06-22 (×8): qty 50

## 2024-06-22 MED ORDER — ARGATROBAN 50 MG/50ML IV SOLN
1.0000 ug/kg/min | INTRAVENOUS | Status: DC
  Administered 2024-06-22 – 2024-06-23 (×2): 1 ug/kg/min via INTRAVENOUS
  Filled 2024-06-22 (×3): qty 50

## 2024-06-22 MED ORDER — LACTATED RINGERS IV BOLUS
1000.0000 mL | Freq: Once | INTRAVENOUS | Status: AC
  Administered 2024-06-22: 1000 mL via INTRAVENOUS

## 2024-06-22 NOTE — ED Notes (Addendum)
 Pt assisted to bedside commode then back to bed; pt denies needs at this time, call light within reach. Oxygen placed back on pt @ 2LPM via [place].

## 2024-06-22 NOTE — Consult Note (Signed)
 Chief Complaint: Patient was seen in consultation today for  Chief Complaint  Patient presents with   Abdominal Pain   Referring Physician(s): Dr. Rodolph  Supervising Physician: Vanice Revel  Patient Status: [facility] - ED  History of Present Illness: Yolanda Gray is a 68 y.o. female with a medical history significant for morbid obesity, COPD, tobacco use and polycythemia. She presented to the ED 06/21/24 with right lower quadrant abdominal pain that had been worsening over a two month period. In the ED she was hypotensive and febrile. CT scan showed acute sigmoid diverticulitis with abscess in the right hemipelvis. She was admitted for complicated diverticulitis and suspected DKA. Surgery was consulted but she was felt to be too high risk.   CT abdomen/pelvis 06/21/24 IMPRESSION: 1. Acute sigmoid diverticulitis with a 5.3 x 4.0 cm diverticular abscess in the right hemipelvis. 2. Sigmoid-sigmoid fistula. 3. Mildly dilated bowel loop in the left hemiabdomen, likely a reactive ileus. Developing obstruction is less likely but not excluded. 4. Fatty liver. 5. Uterine fibroids. 6.  Aortic Atherosclerosis (ICD10-I70.0).   Interventional Radiology has been asked to evaluate patient for an image-guided diverticular abscess aspiration with possible drain placement. Imaging reviewed and procedure approved by Dr. Luverne.    Past Medical History:  Diagnosis Date   COPD (chronic obstructive pulmonary disease) (HCC)    Edentulous    Morbid obesity with BMI of 40.0-44.9, adult (HCC)    Obesity (BMI 30-39.9)    Polycythemia    Thrombocytopenia    Tobacco abuse    Tobacco abuse     Past Surgical History:  Procedure Laterality Date   CATARACT EXTRACTION W/PHACO Left 10/09/2023   Procedure: CATARACT EXTRACTION PHACO AND INTRAOCULAR LENS PLACEMENT (IOC) LEFT MALYUGIN OMIDRIA  13.44 01:09.5;  Surgeon: Enola Feliciano Hugger, MD;  Location: [facility];  Service:  Ophthalmology;  Laterality: Left;   CATARACT EXTRACTION W/PHACO Right 10/30/2023   Procedure: PHACOEMULSIFICATION, CATARACT, WITH IOL INSERTION 6.65, 00:53.8;  Surgeon: Enola Feliciano Hugger, MD;  Location: [facility];  Service: Ophthalmology;  Laterality: Right;   CESAREAN SECTION     TONSILLECTOMY      Allergies: Porcine (pork) protein-containing drug products  Medications: Prior to Admission medications   Not on File     Family History  Problem Relation Age of Onset   Asthma Mother    Heart attack Mother    Heart attack Father     Social History   Socioeconomic History   Marital status: Married    Spouse name: Not on file   Number of children: Not on file   Years of education: Not on file   Highest education level: Not on file  Occupational History   Not on file  Tobacco Use   Smoking status: Former    Current packs/day: 0.00    Types: Cigarettes    Quit date: 2022    Years since quitting: 3.8   Smokeless tobacco: Never  Vaping Use   Vaping status: Never Used  Substance and Sexual Activity   Alcohol use: Not Currently   Drug use: Never   Sexual activity: Not on file  Other Topics Concern   Not on file  Social History Narrative   Not on file   Social Drivers of Health   Financial Resource Strain: Not on file  Food Insecurity: Not on file  Transportation Needs: Not on file  Physical Activity: Not on file  Stress: Not on file  Social Connections: Not on file  Review of Systems: A 12 point ROS discussed and pertinent positives are indicated in the HPI above.  All other systems are negative.  Review of Systems  Constitutional:  Positive for fatigue.  Gastrointestinal:  Positive for abdominal pain and nausea.  All other systems reviewed and are negative.   Vital Signs: BP 122/61   Pulse (!) 103   Temp 99 F (37.2 C) (Oral)   Resp (!) 27   Ht 5' 3 (1.6 m)   Wt 221 lb 11.2 oz (100.6 kg)   SpO2 99%   BMI 39.27 kg/m   Physical  Exam Constitutional:      General: She is not in acute distress.    Appearance: She is not ill-appearing.  HENT:     Mouth/Throat:     Mouth: Mucous membranes are moist.     Pharynx: Oropharynx is clear.  Cardiovascular:     Rate and Rhythm: Tachycardia present.  Pulmonary:     Effort: Pulmonary effort is normal.  Abdominal:     Tenderness: There is abdominal tenderness.  Skin:    General: Skin is warm and dry.  Neurological:     Mental Status: She is alert and oriented to person, place, and time.  Psychiatric:        Mood and Affect: Mood normal.        Behavior: Behavior normal.        Thought Content: Thought content normal.        Judgment: Judgment normal.     Labs:  CBC: Recent Labs    06/21/24 1017 06/22/24 0338  WBC 9.1 10.1  HGB 13.6 10.5*  HCT 44.1 33.2*  PLT 311 221    COAGS: No results for input(s): INR, APTT in the last 8760 hours.  BMP: Recent Labs    06/21/24 1017 06/21/24 1220 06/21/24 1553 06/22/24 0338  NA 133* 131* 135 132*  K 3.8 4.3 3.7 4.2  CL 92* 95* 99 98  CO2 16* 20* 20* 23  GLUCOSE 401* 395* 177* 170*  BUN 15 16 15 22   CALCIUM 8.7* 8.3* 7.9* 8.2*  CREATININE 1.34* 1.27* 1.09* 0.97  GFRNONAA 44* 47* 56* >60    LIVER FUNCTION TESTS: Recent Labs    06/21/24 1017 06/22/24 0338  BILITOT 1.1 0.9  AST 40 23  ALT 19 16  ALKPHOS 85 53  PROT 9.1* 7.0  ALBUMIN 3.5 2.9*    TUMOR MARKERS: No results for input(s): AFPTM, CEA, CA199, CHROMGRNA in the last 8760 hours.  Assessment and Plan:  Complicated diverticulitis with abscess: Yolanda Gray, 68 year old female, is tentatively scheduled today for an image-guided diverticular abscess aspiration with possible drain placement.   Risks and benefits discussed with the patient including bleeding, infection, damage to adjacent structures, bowel perforation/fistula connection, and sepsis.  All of the patient's questions were answered, patient is agreeable to proceed.  She has been NPO. She has not received any blood-thinning medications since arrival to the ED.   Consent signed and in chart.  Thank you for this interesting consult.  I greatly enjoyed meeting Yolanda Gray and look forward to participating in their care.  A copy of this report was sent to the requesting provider on this date.  Electronically Signed: Warren Dais, AGACNP-BC 06/22/2024, 9:12 AM   I spent a total of 20 Minutes    in face to face in clinical consultation, greater than 50% of which was counseling/coordinating care for diverticular abscess.

## 2024-06-22 NOTE — Progress Notes (Signed)
 Interim progress note  Alerted by nurse to new tachycardia to 160s. Appears to be a-fib on tele and EKG confirms this. Per patient this is a new diagnosis. She is asymptomatic. BPs however are soft. We will continue to treat the underlying problem as we are doing with fluids and antibiotics and abdominal drain. Will order TTE and will touch base with radiology and if safe to start iv heparin will do so. As she is asymptomatic will trial oral metoprolol .

## 2024-06-22 NOTE — Inpatient Diabetes Management (Addendum)
 Inpatient Diabetes Program Recommendations  AACE/ADA: New Consensus Statement on Inpatient Glycemic Control   Target Ranges:  Prepandial:   less than 140 mg/dL      Peak postprandial:   less than 180 mg/dL (1-2 hours)      Critically ill patients:  140 - 180 mg/dL    Latest Reference Range & Units 06/21/24 16:09 06/21/24 17:13 06/21/24 18:15 06/21/24 19:16 06/21/24 20:11 06/21/24 21:46 06/22/24 01:00 06/22/24 04:02 06/22/24 07:49  Glucose-Capillary 70 - 99 mg/dL 783 (H) 815 (H) 771 (H) 204 (H) 209 (H) 253 (H) 217 (H) 151 (H) 161 (H)    Latest Reference Range & Units 06/21/24 10:17 06/21/24 12:20 06/21/24 15:53 06/22/24 03:38  CO2 22 - 32 mmol/L 16 (L) 20 (L) 20 (L) 23  Glucose 70 - 99 mg/dL 598 (H) 604 (H) 822 (H) 170 (H)  Anion gap 5 - 15  25 (H) 16 (H) 16 (H) 11    Latest Reference Range & Units 06/21/24 12:20 06/21/24 15:53  Beta-Hydroxybutyric Acid 0.05 - 0.27 mmol/L 0.35 (H) 0.06    Latest Reference Range & Units 06/21/24 10:17  Hemoglobin A1C 4.8 - 5.6 % 10.7 (H)   Review of Glycemic Control  Diabetes history: NO Outpatient Diabetes medications: NA Current orders for Inpatient glycemic control: Semglee 10 units BID, Novolog 0-6 units Q4H  Inpatient Diabetes Program Recommendations:    NOTE: Patient admitted with septic shock, acute diverticulitis, diverticular abscess, HHS, and AKI. Initial lab glucose 401 mg/dl and patient was started in IV insulin which has been transitioned to SQ insulin regimen. Ordered Living Well with DM book, insulin starter kit, and RD consult for diet education. Will plan to talk with patient today regarding new DM dx.  Addendum 06/22/24@13 :40-Patient was in ED and off the unit for IR to place drain. Informed that patient will be transferred to room 142. Went by room 142 and patient still has not arrived to unit but patient's brother was in the room waiting for patient to be transferred there. Will plan to follow up with patient tomorrow regarding new  DM dx.  Thanks, Earnie Gainer, RN, MSN, CDCES Diabetes Coordinator Inpatient Diabetes Program [phone] (Team Pager from 8am to 5pm)

## 2024-06-22 NOTE — Plan of Care (Signed)
  Problem: Education: Goal: Ability to describe self-care measures that may prevent or decrease complications (Diabetes Survival Skills Education) will improve Outcome: Progressing   Problem: Metabolic: Goal: Ability to maintain appropriate glucose levels will improve Outcome: Progressing   Problem: Skin Integrity: Goal: Risk for impaired skin integrity will decrease Outcome: Progressing   Problem: Cardiac: Goal: Ability to maintain an adequate cardiac output will improve Outcome: Progressing   Problem: Nutritional: Goal: Maintenance of adequate weight for body size and type will improve Outcome: Progressing

## 2024-06-22 NOTE — Progress Notes (Signed)
 CROSS COVER NOTE  NAME: Yolanda Gray MRN: [id number] DOB : 03-09-1956    Concern as stated by nurse / staff   HR running 160, 1809H she was given oral metoprolol  25mg  q 6hours. CCMD called regarding sustained HR of 150-160's looks afib. patient was started on argatroban 1 mg/mL infusion. BP still running soft. latest VS yellow MEWS bp 114/78 map 86, HR 158, no complain of Chest pain. asymptomatic.. came to hospital for right lower quadrant abdominal pain, today she under went drain placement for Acute sigmoid diverticulitis with abscess.      Pertinent findings on chart review: Brief review: - Admitted 11/10 with sepsis secondary to sigmoid abscess and DKA - Went into rapid A-fib evening of 06/22/2024, started on argatroban - Tachycardic and hypotensive after metoprolol  x 2  Patient Assessment Evaluated at bedside Review of Systems  Constitutional:  Negative for diaphoresis.  Respiratory:  Negative for shortness of breath.   Cardiovascular:  Negative for chest pain and leg swelling.    Date/Time Temp Pulse ECG Heart Rate Resp BP SpO2  06/22/24 2233 -- -- 158 Abnormal  -- 114/78 90 %  06/22/24 2154 -- -- -- -- 99/67 --  06/22/24 2138 -- -- -- -- 94/69 --  06/22/24 20:38:16 98.1 F (36.7 C) 82 -- 18 84/70    94 %  06/22/24 18:06:24 99 F (37.2 C) 155 Abnormal  -- 20 117/85 90 %  06/22/24 17:50:34 98.9 F (37.2 C) 166 Abnormal  -- -- 105/75 94 %  06/22/24 16:35:11 101.3 F (38.5 C)  72 -- 15 97/76 94 %  06/22/24 14:08:12 99.2 F (37.3 C) 110 Abnormal  -- 22 Abnormal  113/77 97 %   Physical Exam Vitals and nursing note reviewed.  Constitutional:      General: She is not in acute distress. HENT:     Head: Normocephalic and atraumatic.  Cardiovascular:     Rate and Rhythm: Tachycardia present. Rhythm irregular.     Heart sounds: Normal heart sounds.  Pulmonary:     Effort: Pulmonary effort is normal.     Breath sounds: Normal breath sounds.  Neurological:      Mental Status: Mental status is at baseline.       Assessment and  Interventions   Assessment:   New onset A-fib with RVR in the setting of sepsis, uncontrolled with beta-blocker Hypotension in the setting of  treatment for sepsis   Plan: Trial of IV digoxin x 2, if no improvement will start amiodarone Giving an NS fluid bolus to help with BP Cardiology consult for in the a.m. Continue management of sepsis   On reassessment-->improven:     06/23/2024    3:38 AM 06/22/2024   11:57 PM 06/22/2024   10:33 PM  Vitals with BMI  Systolic 110 124 885  Diastolic 70 74 78  Pulse 109 128        CRITICAL CARE Performed by: Delayne LULLA Solian   Total critical care time: 45 minutes  Critical care time was exclusive of separately billable procedures and treating other patients.  Critical care was necessary to treat or prevent imminent or life-threatening deterioration.  Critical care was time spent personally by me on the following activities: development of treatment plan with patient and/or surrogate as well as nursing, discussions with consultants, evaluation of patient's response to treatment, examination of patient, obtaining history from patient or surrogate, ordering and performing treatments and interventions, ordering and review of laboratory studies,  ordering and review of radiographic studies, pulse oximetry and re-evaluation of patient's condition.

## 2024-06-22 NOTE — ED Notes (Signed)
Pt transported to IR for procedure

## 2024-06-22 NOTE — Consult Note (Signed)
 PHARMACY - ANTICOAGULATION CONSULT NOTE  Pharmacy Consult for Argatroban Indication: new onset atrial fibrillation  Allergies  Allergen Reactions   Porcine (Pork) Protein-Containing Drug Products     Patient Measurements: Height: 5' 3 (160 cm) Weight: 100.6 kg (221 lb 11.2 oz) IBW/kg (Calculated) : 52.4 HEPARIN DW (KG): 76  Vital Signs: Temp: 99 F (37.2 C) (11/11 1806) Temp Source: Oral (11/11 1408) BP: 117/85 (11/11 1806) Pulse Rate: 155 (11/11 1806)  Labs: Recent Labs    06/21/24 1017 06/21/24 1220 06/21/24 1553 06/22/24 0338  HGB 13.6  --   --  10.5*  HCT 44.1  --   --  33.2*  PLT 311  --   --  221  CREATININE 1.34* 1.27* 1.09* 0.97    Estimated Creatinine Clearance: 64.6 mL/min (by C-G formula based on SCr of 0.97 mg/dL).   Medical History: Past Medical History:  Diagnosis Date   COPD (chronic obstructive pulmonary disease) (HCC)    Edentulous    Morbid obesity with BMI of 40.0-44.9, adult (HCC)    Obesity (BMI 30-39.9)    Polycythemia    Thrombocytopenia    Tobacco abuse    Tobacco abuse     Medications:  No history of chronic anticoagulant use PTA.  Assessment: 68 y.o. female with medical history significant for obesity, COPD not on home oxygen, ongoing active smoking, polycythemia who came into ED from home complaining of right lower quadrant abdominal pain intermittent for 2 months. Per note from attending, alerted by nurse to new tachycardia to 160s. Appears to be a-fib on tele and EKG confirms this. Per patient this is a new diagnosis. 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 Pharmacy consulted to initiate and dose argatroban infusion. Baseline labs have been ordered and are pending.   Goal of Therapy:  aPTT 50-90 seconds Monitor platelets by anticoagulation protocol: Yes   Plan:  - start argatroban at 1mcg/kg/min - check aPTT 4 hours after initiation - CBC/H&H  daily - plan to transition to DOAC once afib is controlled  Caroline Matters A Kenzlei Runions 06/22/2024,7:20 PM

## 2024-06-22 NOTE — Procedures (Signed)
 Interventional Radiology Procedure Note  Procedure: CT DRAIN RLQ ABSCESS    Complications: None  Estimated Blood Loss:  MIN  Findings: 10FR DRAIN 35CC PUS CX SENT     EMERSON FREDERIC SPECKING, MD

## 2024-06-22 NOTE — ED Notes (Signed)
 NURSE HEATHER INFORMED OF BED ASSIGNED

## 2024-06-22 NOTE — ED Notes (Signed)
 Pt ambulatory from bed to toilet independently with steady gait.

## 2024-06-22 NOTE — Progress Notes (Addendum)
 PROGRESS NOTE    Yolanda Gray  FMW:[id number] DOB: [birth year] DOA: 06/21/2024 PCP: Freddrick, No  Outpatient Specialists: none    Brief Narrative:    Yolanda Gray is a pleasant 68 y.o. female with medical history significant for obesity, COPD not on home oxygen, ongoing active smoking, polycythemia who came into ED from home complaining of right lower quadrant abdominal pain intermittent for 2 months.  Patient is stated that since yesterday pain has been generalized in her whole abdomen.  She has a history of constipation and last night she had a bowel movement at 2 AM which was hard and difficult to pass.  Patient reports that patient was having chills today.  Patient denied any chest pain, shortness of breath, nausea, vomiting, diarrhea.     Assessment & Plan:   Principal Problem:   DKA (diabetic ketoacidosis) (HCC) Active Problems:   Obesity (BMI 30-39.9)   Abscess of sigmoid colon due to diverticulitis   Septic shock (HCC)   AKI (acute kidney injury)   T2DM (type 2 diabetes mellitus) (HCC)   # Acute sigmoid diverticulitis with abscess 5.3 x 4 cm abscess right hemipelvis.  - for IR drain placement today - continue zosyn - follow blood cultures - npo for now  # Sepsis, severe By elevated lactate, tachycardia. Much improved. Briefly required pressors on admission - continue IVF and antibiotics  # T2DM, uncontrolled # DKA New diagnosis dm with A1c 10.7%. Doesn't see a doctor, not on any meds outpatient. Glucose in the 400s on arrival with low bicarb and gap. Resolved - continue semglee 10 bid, ssi, 2 units w/ meals - appreciate dm educator recs  # AKI Mild, 2/2 above process, resolved w/ fluids - monitor  # Obesity Noted  # HCM Hiv neg - f/u hcv   DVT prophylaxis: SCDs Code Status: full Family Communication: none at bedside  Level of care: Med-Surg Status is: Inpatient Remains inpatient appropriate because: severity of illness    Consultants:  Gen surg,  IR  Procedures: pending  Antimicrobials:  zosyn    Subjective: Reports stable right sided abdominal pain  Objective: Vitals:   06/22/24 0745 06/22/24 0752 06/22/24 0800 06/22/24 0900  BP:   122/61 (!) 163/81  Pulse: (!) 102  (!) 103 (!) 111  Resp: (!) 23  (!) 27 (!) 26  Temp:  99 F (37.2 C)    TempSrc:  Oral    SpO2: 99%  99% 100%  Weight:      Height:        Intake/Output Summary (Last 24 hours) at 06/22/2024 0945 Last data filed at 06/22/2024 0917 Gross per 24 hour  Intake 5145.1 ml  Output 250 ml  Net 4895.1 ml   Filed Weights   06/21/24 1254  Weight: 100.6 kg    Examination:  General exam: Appears calm and comfortable  Respiratory system: Clear to auscultation. Respiratory effort normal. Cardiovascular system: S1 & S2 heard, RRR.   Gastrointestinal system: Abdomen is obese, TTP on the right Central nervous system: Alert and oriented. No focal neurological deficits. Extremities: Symmetric 5 x 5 power. Skin: No rashes, lesions or ulcers Psychiatry: Judgement and insight appear normal. Mood & affect appropriate.     Data Reviewed: I have personally reviewed following labs and imaging studies  CBC: Recent Labs  Lab 06/21/24 1017 06/22/24 0338  WBC 9.1 10.1  HGB 13.6 10.5*  HCT 44.1 33.2*  MCV 90.9 86.9  PLT 311 221   Basic Metabolic Panel: Recent Labs  Lab 06/21/24 1017 06/21/24 1220 06/21/24 1553 06/22/24 0338  NA 133* 131* 135 132*  K 3.8 4.3 3.7 4.2  CL 92* 95* 99 98  CO2 16* 20* 20* 23  GLUCOSE 401* 395* 177* 170*  BUN 15 16 15 22   CREATININE 1.34* 1.27* 1.09* 0.97  CALCIUM 8.7* 8.3* 7.9* 8.2*   GFR: Estimated Creatinine Clearance: 64.6 mL/min (by C-G formula based on SCr of 0.97 mg/dL). Liver Function Tests: Recent Labs  Lab 06/21/24 1017 06/22/24 0338  AST 40 23  ALT 19 16  ALKPHOS 85 53  BILITOT 1.1 0.9  PROT 9.1* 7.0  ALBUMIN 3.5 2.9*   Recent Labs  Lab 06/21/24 1017  LIPASE 21   No results for input(s):  AMMONIA in the last 168 hours. Coagulation Profile: No results for input(s): INR, PROTIME in the last 168 hours. Cardiac Enzymes: No results for input(s): CKTOTAL, CKMB, CKMBINDEX, TROPONINI in the last 168 hours. BNP (last 3 results) No results for input(s): PROBNP in the last 8760 hours. HbA1C: Recent Labs    06/21/24 1017  HGBA1C 10.7*   CBG: Recent Labs  Lab 06/21/24 2011 06/21/24 2146 06/22/24 0100 06/22/24 0402 06/22/24 0749  GLUCAP 209* 253* 217* 151* 161*   Lipid Profile: No results for input(s): CHOL, HDL, LDLCALC, TRIG, CHOLHDL, LDLDIRECT in the last 72 hours. Thyroid Function Tests: No results for input(s): TSH, T4TOTAL, FREET4, T3FREE, THYROIDAB in the last 72 hours. Anemia Panel: No results for input(s): VITAMINB12, FOLATE, FERRITIN, TIBC, IRON, RETICCTPCT in the last 72 hours. Urine analysis:    Component Value Date/Time   COLORURINE AMBER (A) 06/21/2024 1702   APPEARANCEUR CLOUDY (A) 06/21/2024 1702   LABSPEC >1.046 (H) 06/21/2024 1702   PHURINE 5.0 06/21/2024 1702   GLUCOSEU >=500 (A) 06/21/2024 1702   HGBUR NEGATIVE 06/21/2024 1702   BILIRUBINUR NEGATIVE 06/21/2024 1702   KETONESUR NEGATIVE 06/21/2024 1702   PROTEINUR >=300 (A) 06/21/2024 1702   NITRITE NEGATIVE 06/21/2024 1702   LEUKOCYTESUR MODERATE (A) 06/21/2024 1702   Sepsis Labs: @LABRCNTIP (procalcitonin:4,lacticidven:4)  ) Recent Results (from the past 240 hours)  Resp panel by RT-PCR (RSV, Flu A&B, Covid) Anterior Nasal Swab     Status: None   Collection Time: 06/21/24 10:18 AM   Specimen: Anterior Nasal Swab  Result Value Ref Range Status   SARS Coronavirus 2 by RT PCR NEGATIVE NEGATIVE Final    Comment: (NOTE) SARS-CoV-2 target nucleic acids are NOT DETECTED.  The SARS-CoV-2 RNA is generally detectable in upper respiratory specimens during the acute phase of infection. The lowest concentration of SARS-CoV-2 viral copies this assay  can detect is 138 copies/mL. A negative result does not preclude SARS-Cov-2 infection and should not be used as the sole basis for treatment or other patient management decisions. A negative result may occur with  improper specimen collection/handling, submission of specimen other than nasopharyngeal swab, presence of viral mutation(s) within the areas targeted by this assay, and inadequate number of viral copies(<138 copies/mL). A negative result must be combined with clinical observations, patient history, and epidemiological information. The expected result is Negative.  Fact Sheet for Patients:  bloggercourse.com  Fact Sheet for Healthcare Providers:  seriousbroker.it  This test is no t yet approved or cleared by the [place]  FDA and  has been authorized for detection and/or diagnosis of SARS-CoV-2 by FDA under an Emergency Use Authorization (EUA). This EUA will remain  in effect (meaning this test can be used) for the duration of the COVID-19 declaration under Section 564(b)(1) of  the Act, 21 U.S.C.section 360bbb-3(b)(1), unless the authorization is terminated  or revoked sooner.       Influenza A by PCR NEGATIVE NEGATIVE Final   Influenza B by PCR NEGATIVE NEGATIVE Final    Comment: (NOTE) The Xpert Xpress SARS-CoV-2/FLU/RSV plus assay is intended as an aid in the diagnosis of influenza from Nasopharyngeal swab specimens and should not be used as a sole basis for treatment. Nasal washings and aspirates are unacceptable for Xpert Xpress SARS-CoV-2/FLU/RSV testing.  Fact Sheet for Patients: bloggercourse.com  Fact Sheet for Healthcare Providers: seriousbroker.it  This test is not yet approved or cleared by the [place]  FDA and has been authorized for detection and/or diagnosis of SARS-CoV-2 by FDA under an Emergency Use Authorization (EUA). This EUA will  remain in effect (meaning this test can be used) for the duration of the COVID-19 declaration under Section 564(b)(1) of the Act, 21 U.S.C. section 360bbb-3(b)(1), unless the authorization is terminated or revoked.     Resp Syncytial Virus by PCR NEGATIVE NEGATIVE Final    Comment: (NOTE) Fact Sheet for Patients: bloggercourse.com  Fact Sheet for Healthcare Providers: seriousbroker.it  This test is not yet approved or cleared by the [place]  FDA and has been authorized for detection and/or diagnosis of SARS-CoV-2 by FDA under an Emergency Use Authorization (EUA). This EUA will remain in effect (meaning this test can be used) for the duration of the COVID-19 declaration under Section 564(b)(1) of the Act, 21 U.S.C. section 360bbb-3(b)(1), unless the authorization is terminated or revoked.  Performed at [facility], [address] [place], [place] [address]   Culture, blood (Routine X 2) w Reflex to ID Panel     Status: None (Preliminary result)   Collection Time: 06/21/24  2:00 PM   Specimen: BLOOD  Result Value Ref Range Status   Specimen Description BLOOD BLOOD RIGHT HAND  Final   Special Requests   Final    BOTTLES DRAWN AEROBIC AND ANAEROBIC Blood Culture results may not be optimal due to an inadequate volume of blood received in culture bottles   Culture   Final    NO GROWTH < 24 HOURS Performed at [facility], [address] [place], [place] [address]    Report Status PENDING  Incomplete  Culture, blood (Routine X 2) w Reflex to ID Panel     Status: None (Preliminary result)   Collection Time: 06/21/24  2:00 PM   Specimen: BLOOD  Result Value Ref Range Status   Specimen Description BLOOD RIGHT ANTECUBITAL  Final   Special Requests   Final    BOTTLES DRAWN AEROBIC AND ANAEROBIC Blood Culture results may not be optimal due to an inadequate volume of blood received in culture bottles   Culture    Final    NO GROWTH < 24 HOURS Performed at [facility], [address] [place], [place] [address]    Report Status PENDING  Incomplete         Radiology Studies: DG Chest Port 1 View Result Date: 06/21/2024 CLINICAL DATA:  Fever. EXAM: PORTABLE CHEST 1 VIEW COMPARISON:  Chest radiograph dated 09/22/2021. FINDINGS: There is cardiomegaly with central vascular congestion. No focal consolidation, pleural effusion, pneumothorax. No acute osseous pathology. IMPRESSION: Cardiomegaly with central vascular congestion. No focal consolidation. Electronically Signed   By: Vanetta Chou M.D.   On: 06/21/2024 13:28   CT ABDOMEN PELVIS W CONTRAST Result Date: 06/21/2024 CLINICAL DATA:  Right lower quadrant abdominal pain. EXAM: CT ABDOMEN AND  PELVIS WITH CONTRAST TECHNIQUE: Multidetector CT imaging of the abdomen and pelvis was performed using the standard protocol following bolus administration of intravenous contrast. RADIATION DOSE REDUCTION: This exam was performed according to the departmental dose-optimization program which includes automated exposure control, adjustment of the mA and/or kV according to patient size and/or use of iterative reconstruction technique. CONTRAST:  80mL OMNIPAQUE IOHEXOL 300 MG/ML  SOLN COMPARISON:  None Available. FINDINGS: Lower chest: No acute abnormality. No intra-abdominal free air.  Small free fluid in the pelvis. Hepatobiliary: Fatty appearing liver. No biliary ductal dilatation. The gallbladder is unremarkable. Pancreas: Unremarkable. No pancreatic ductal dilatation or surrounding inflammatory changes. Spleen: Normal in size without focal abnormality. Adrenals/Urinary Tract: The right adrenal glands unremarkable. There is a 1.5 cm indeterminate left adrenal nodule. There is no hydronephrosis on either side. The visualized ureters and urinary bladder appear unremarkable. Stomach/Bowel: There is severe sigmoid diverticulosis. There is active inflammatory  changes consistent with acute diverticulitis. There is a 5.3 x 4.0 cm complex collection in the right hemipelvis medial to the right ovary containing fluid and air consistent with diverticular abscess. There is a linear tract extending from the distal sigmoid colon to a segment of the sigmoid colon more proximally (series 2 images 56-65) consistent with a fistula. Mildly dilated bowel loop in the left hemiabdomen measures 3.5 cm, likely a reactive ileus. Developing obstruction is less likely but not excluded. The appendix is not visualized with certainty. No inflammatory changes identified in the right lower quadrant. Vascular/Lymphatic: Moderate aortoiliac atherosclerotic disease. The IVC is unremarkable. No portal venous gas. There is no adenopathy. Reproductive: The uterus is anteverted. Uterine fibroids noted. No suspicious adnexal masses. Other: None Musculoskeletal: Degenerative changes of the spine. No acute osseous pathology. IMPRESSION: 1. Acute sigmoid diverticulitis with a 5.3 x 4.0 cm diverticular abscess in the right hemipelvis. 2. Sigmoid-sigmoid fistula. 3. Mildly dilated bowel loop in the left hemiabdomen, likely a reactive ileus. Developing obstruction is less likely but not excluded. 4. Fatty liver. 5. Uterine fibroids. 6.  Aortic Atherosclerosis (ICD10-I70.0). Electronically Signed   By: Vanetta Chou M.D.   On: 06/21/2024 13:12        Scheduled Meds:  insulin aspart  0-6 Units Subcutaneous Q4H   insulin glargine-yfgn  10 Units Subcutaneous BID   insulin starter kit- pen needles  1 kit Other Once   living well with diabetes book   Does not apply Once   pantoprazole (PROTONIX) IV  40 mg Intravenous Q12H   Continuous Infusions:  sodium chloride      lactated ringers     piperacillin-tazobactam (ZOSYN)  IV Stopped (06/22/24 0917)     LOS: 1 day   CRITICAL CARE Performed by: Devaughn KATHEE Ban   Total critical care time: 55 minutes  Critical care time was exclusive of separately  billable procedures and treating other patients.  Critical care was necessary to treat or prevent imminent or life-threatening deterioration.  Critical care was time spent personally by me on the following activities: development of treatment plan with patient and/or surrogate as well as nursing, discussions with consultants, evaluation of patient's response to treatment, examination of patient, obtaining history from patient or surrogate, ordering and performing treatments and interventions, ordering and review of laboratory studies, ordering and review of radiographic studies, pulse oximetry and re-evaluation of patient's condition.    Devaughn KATHEE Ban, MD [facility]   If 7PM-7AM, please contact night-coverage [email or web address] Password [email or web address] 06/22/2024, 9:45 AM

## 2024-06-22 NOTE — Progress Notes (Signed)
   06/22/24 1408  Vitals  Temp 99.2 F (37.3 C)  Temp Source Oral  BP 113/77  MAP (mmHg) 87  BP Location Left Arm  BP Method Automatic  Patient Position (if appropriate) Lying  Pulse Rate (!) 110  Pulse Rate Source Monitor  Resp (!) 22  MEWS COLOR  MEWS Score Color Yellow  Oxygen Therapy  SpO2 97 %  O2 Device Nasal Cannula  MEWS Score  MEWS Temp 0  MEWS Systolic 0  MEWS Pulse 1  MEWS RR 1  MEWS LOC 0  MEWS Score 2   Patient admitted from PACU alert, 2LNC, with pain 10/10.  Patient up with minimal assist to restroom.  Safety precautions in place with call bell education provided and within reach.  Plan of care continues.

## 2024-06-22 NOTE — ED Notes (Signed)
 IR to bedside to consent pt. Consent taken with IR back to procedure area.

## 2024-06-22 NOTE — Progress Notes (Signed)
 [facility]- General Surgery  SURGICAL PROGRESS NOTE  Hospital Day(s): 1.   Interval History:  Patient's vital signs are more stable today. Latest vitals show HR 108 and BP of 148/75. Labs are also improving. Lactic acid levels are normal and no leukocytosis indicated. Pain level is about the same but worse with movement. Patient denies any nausea or vomiting. Patient just requesting ice chips this morning.   Vital signs in last 24 hours: [min-max] current  Temp:  [97.7 F (36.5 C)-100.4 F (38 C)] 99 F (37.2 C) (11/11 0752) Pulse Rate:  [62-179] 108 (11/11 1006) Resp:  [16-33] 24 (11/11 1006) BP: (63-163)/(37-81) 148/75 (11/11 1006) SpO2:  [87 %-100 %] 93 % (11/11 1006) Weight:  [100.6 kg] 100.6 kg (11/10 1254)     Height: 5' 3 (160 cm) Weight: 100.6 kg BMI (Calculated): 39.28   Intake/Output last 2 shifts:  11/10 0701 - 11/11 0700 In: 4095.1 [I.V.:783.1; IV Piggyback:3312] Out: 250 [Urine:250]   Physical Exam:  Constitutional: alert, cooperative and no distress  Respiratory: breathing non-labored at rest  Cardiovascular: tachycardic and sinus rhythm  Gastrointestinal: soft, tender to palpation on RLQ, and distended  Labs:     Latest Ref Rng & Units 06/22/2024    3:38 AM 06/21/2024   10:17 AM 09/22/2021    2:40 PM  CBC  WBC 4.0 - 10.5 K/uL 10.1  9.1  8.3   Hemoglobin 12.0 - 15.0 g/dL 89.4  86.3  84.6   Hematocrit 36.0 - 46.0 % 33.2  44.1  49.6   Platelets 150 - 400 K/uL 221  311  149       Latest Ref Rng & Units 06/22/2024    3:38 AM 06/21/2024    3:53 PM 06/21/2024   12:20 PM  CMP  Glucose 70 - 99 mg/dL 829  822  604   BUN 8 - 23 mg/dL 22  15  16    Creatinine 0.44 - 1.00 mg/dL 9.02  8.90  8.72   Sodium 135 - 145 mmol/L 132  135  131   Potassium 3.5 - 5.1 mmol/L 4.2  3.7  4.3   Chloride 98 - 111 mmol/L 98  99  95   CO2 22 - 32 mmol/L 23  20  20    Calcium 8.9 - 10.3 mg/dL 8.2  7.9  8.3   Total Protein 6.5 - 8.1 g/dL 7.0     Total Bilirubin 0.0 - 1.2 mg/dL  0.9     Alkaline Phos 38 - 126 U/L 53     AST 15 - 41 U/L 23     ALT 0 - 44 U/L 16        Imaging studies: No new pertinent imaging studies   Assessment/Plan:  68 y.o. female with acute complicated diverticulitis with abscess and fistula, complicated by pertinent comorbidities including COPD, obesity polycythemia, and history of tobacco abuse.    - Discussed case with IR. Will attempt drain placement this afternoon. Expecting pain level to improve with drain   - Continue NPO but allow ice chips per patient's request   - Continue IV Zosyn    - Continue IV fluids and pain management    -- Vanesha Athens Barrientos PA-C

## 2024-06-23 ENCOUNTER — Telehealth (HOSPITAL_COMMUNITY): Payer: Self-pay

## 2024-06-23 ENCOUNTER — Other Ambulatory Visit (HOSPITAL_COMMUNITY): Payer: Self-pay

## 2024-06-23 DIAGNOSIS — E669 Obesity, unspecified: Secondary | ICD-10-CM

## 2024-06-23 DIAGNOSIS — N179 Acute kidney failure, unspecified: Secondary | ICD-10-CM

## 2024-06-23 DIAGNOSIS — R6521 Severe sepsis with septic shock: Secondary | ICD-10-CM

## 2024-06-23 DIAGNOSIS — E1169 Type 2 diabetes mellitus with other specified complication: Secondary | ICD-10-CM

## 2024-06-23 DIAGNOSIS — K5720 Diverticulitis of large intestine with perforation and abscess without bleeding: Secondary | ICD-10-CM | POA: Diagnosis not present

## 2024-06-23 DIAGNOSIS — E1110 Type 2 diabetes mellitus with ketoacidosis without coma: Secondary | ICD-10-CM | POA: Diagnosis not present

## 2024-06-23 DIAGNOSIS — A419 Sepsis, unspecified organism: Principal | ICD-10-CM

## 2024-06-23 LAB — CBC
HCT: 35.6 % — ABNORMAL LOW (ref 36.0–46.0)
Hemoglobin: 11.2 g/dL — ABNORMAL LOW (ref 12.0–15.0)
MCH: 27.8 pg (ref 26.0–34.0)
MCHC: 31.5 g/dL (ref 30.0–36.0)
MCV: 88.3 fL (ref 80.0–100.0)
Platelets: 267 K/uL (ref 150–400)
RBC: 4.03 MIL/uL (ref 3.87–5.11)
RDW: 13.7 % (ref 11.5–15.5)
WBC: 14.3 K/uL — ABNORMAL HIGH (ref 4.0–10.5)
nRBC: 0 % (ref 0.0–0.2)

## 2024-06-23 LAB — BASIC METABOLIC PANEL WITH GFR
Anion gap: 12 (ref 5–15)
BUN: 17 mg/dL (ref 8–23)
CO2: 26 mmol/L (ref 22–32)
Calcium: 8.5 mg/dL — ABNORMAL LOW (ref 8.9–10.3)
Chloride: 98 mmol/L (ref 98–111)
Creatinine, Ser: 0.87 mg/dL (ref 0.44–1.00)
GFR, Estimated: >60 mL/min (ref >60)
Glucose, Bld: 173 mg/dL — ABNORMAL HIGH (ref 70–99)
Potassium: 4.1 mmol/L (ref 3.5–5.1)
Sodium: 135 mmol/L (ref 135–145)

## 2024-06-23 LAB — GLUCOSE, CAPILLARY
Glucose-Capillary: 150 mg/dL — ABNORMAL HIGH (ref 70–99)
Glucose-Capillary: 144 mg/dL — ABNORMAL HIGH (ref 70–99)
Glucose-Capillary: 172 mg/dL — ABNORMAL HIGH (ref 70–99)
Glucose-Capillary: 153 mg/dL — ABNORMAL HIGH (ref 70–99)
Glucose-Capillary: 165 mg/dL — ABNORMAL HIGH (ref 70–99)
Glucose-Capillary: 150 mg/dL — ABNORMAL HIGH (ref 70–99)

## 2024-06-23 LAB — APTT
aPTT: 85 s — ABNORMAL HIGH (ref 24–36)
aPTT: 110 s — ABNORMAL HIGH (ref 24–36)
aPTT: 104 s — ABNORMAL HIGH (ref 24–36)
aPTT: 92 s — ABNORMAL HIGH (ref 24–36)
aPTT: 87 s — ABNORMAL HIGH (ref 24–36)

## 2024-06-23 MED ORDER — HYDROMORPHONE HCL 1 MG/ML IJ SOLN
0.5000 mg | INTRAMUSCULAR | Status: DC | PRN
  Administered 2024-06-23: 0.5 mg via INTRAVENOUS
  Filled 2024-06-23: qty 0.5

## 2024-06-23 MED ORDER — AMIODARONE HCL IN DEXTROSE 360-4.14 MG/200ML-% IV SOLN
60.0000 mg/h | INTRAVENOUS | Status: AC
  Administered 2024-06-23 (×2): 60 mg/h via INTRAVENOUS
  Filled 2024-06-23: qty 200

## 2024-06-23 MED ORDER — ARGATROBAN 50 MG/50ML IV SOLN
0.6700 ug/kg/min | INTRAVENOUS | Status: DC
  Administered 2024-06-23: 0.67 ug/kg/min via INTRAVENOUS
  Filled 2024-06-23: qty 50

## 2024-06-23 MED ORDER — METOPROLOL TARTRATE 25 MG PO TABS
12.5000 mg | ORAL_TABLET | Freq: Two times a day (BID) | ORAL | Status: DC
  Administered 2024-06-23: 12.5 mg via ORAL
  Filled 2024-06-23: qty 1

## 2024-06-23 MED ORDER — AMIODARONE HCL IN DEXTROSE 360-4.14 MG/200ML-% IV SOLN
30.0000 mg/h | INTRAVENOUS | Status: AC
  Administered 2024-06-24 – 2024-06-25 (×4): 30 mg/h via INTRAVENOUS
  Filled 2024-06-23 (×4): qty 200

## 2024-06-23 MED ORDER — AMIODARONE LOAD VIA INFUSION
150.0000 mg | Freq: Once | INTRAVENOUS | Status: AC
  Administered 2024-06-23: 150 mg via INTRAVENOUS
  Filled 2024-06-23: qty 83.34

## 2024-06-23 MED ORDER — ARGATROBAN 50 MG/50ML IV SOLN
0.3700 ug/kg/min | INTRAVENOUS | Status: DC
  Administered 2024-06-23: 0.47 ug/kg/min via INTRAVENOUS
  Administered 2024-06-24 – 2024-06-25 (×2): 0.37 ug/kg/min via INTRAVENOUS
  Filled 2024-06-23 (×3): qty 50

## 2024-06-23 NOTE — Consult Note (Signed)
 PHARMACY - ANTICOAGULATION CONSULT NOTE  Pharmacy Consult for Argatroban Indication: new onset atrial fibrillation  Allergies  Allergen Reactions   Porcine (Pork) Protein-Containing Drug Products     Patient Measurements: Height: 5' 3 (160 cm) Weight: 100.6 kg (221 lb 11.2 oz) IBW/kg (Calculated) : 52.4 HEPARIN DW (KG): 76  Vital Signs: Temp: 98.3 F (36.8 C) (11/12 1548) BP: 134/74 (11/12 1548) Pulse Rate: 121 (11/12 1548)  Labs: Recent Labs    06/21/24 1017 06/21/24 1220 06/21/24 1553 06/22/24 0338 06/22/24 2006 06/23/24 0137 06/23/24 0558 06/23/24 1204 06/23/24 1859  HGB 13.6  --   --  10.5*  --  11.2*  --   --   --   HCT 44.1  --   --  33.2*  --  35.6*  --   --   --   PLT 311  --   --  221  --  267  --   --   --   APTT  --   --   --   --    < > 85* 110* 104* 92*  CREATININE 1.34*   < > 1.09* 0.97  --  0.87  --   --   --    < > = values in this interval not displayed.    Estimated Creatinine Clearance: 72 mL/min (by C-G formula based on SCr of 0.87 mg/dL).   Medical History: Past Medical History:  Diagnosis Date   COPD (chronic obstructive pulmonary disease) (HCC)    Edentulous    Morbid obesity with BMI of 40.0-44.9, adult (HCC)    Obesity (BMI 30-39.9)    Polycythemia    Thrombocytopenia    Tobacco abuse    Tobacco abuse     Medications:  No history of chronic anticoagulant use PTA.  Assessment: 68 y.o. female with medical history significant for obesity, COPD not on home oxygen, ongoing active smoking, polycythemia who came into ED from home complaining of right lower quadrant abdominal pain intermittent for 2 months. Per note from attending, alerted by nurse to new tachycardia to 160s. Appears to be a-fib on tele and EKG confirms this. Per patient this is a new diagnosis. CHADSAVSAc 3 (MD, female, age). CBC remains stable. 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 Limited  data with argatroban with afib. Pharmacy consulted to initiate and dose argatroban infusion.    Goal of Therapy:  aPTT 50-90 seconds Monitor platelets by anticoagulation protocol: Yes  11/12 0137 aPTT 85, therapeutic x 1 11/12 0558 aPTT 110, supratherapeutic 11/12 1204  aPTT 104, supratherapeutic 11/12 1900 aPTT 92,  supratherpeautic    Plan:  aPTT is supratherapeutic. Decrease infusion to 0.37 mcg/kg/hr. Recheck aPTT in 4 hours. CBC daily while on argatroban.   Yolanda Gray, PharmD Clinical Pharmacist 06/23/2024 7:33 PM

## 2024-06-23 NOTE — Plan of Care (Signed)
  Problem: Coping: Goal: Ability to adjust to condition or change in health will improve Outcome: Progressing   Problem: Metabolic: Goal: Ability to maintain appropriate glucose levels will improve Outcome: Progressing   Problem: Cardiac: Goal: Ability to maintain an adequate cardiac output will improve Outcome: Progressing   Problem: Fluid Volume: Goal: Ability to achieve a balanced intake and output will improve Outcome: Progressing

## 2024-06-23 NOTE — Telephone Encounter (Signed)
 Pharmacy Patient Advocate Encounter  Received notification from [employer] that Prior Authorization for Freestyle Libre 3 Plus Sensor has been APPROVED from 06/23/24 to 08/11/25. Ran test claim, Copay is $13.11. This test claim was processed through [facility]- copay amounts may vary at other pharmacies due to pharmacy/plan contracts, or as the patient moves through the different stages of their insurance plan.   PA #/Case ID/Reference #: [id number]

## 2024-06-23 NOTE — Inpatient Diabetes Management (Addendum)
 Inpatient Diabetes Program Recommendations  AACE/ADA: New Consensus Statement on Inpatient Glycemic Control   Target Ranges:  Prepandial:   less than 140 mg/dL      Peak postprandial:   less than 180 mg/dL (1-2 hours)      Critically ill patients:  140 - 180 mg/dL    Latest Reference Range & Units 06/22/24 07:49 06/22/24 11:12 06/22/24 17:24 06/22/24 21:45 06/22/24 23:52 06/23/24 03:39  Glucose-Capillary 70 - 99 mg/dL 838 (H) 840 (H) 816 (H) 172 (H) 181 (H) 150 (H)    Latest Reference Range & Units 06/21/24 10:17  Hemoglobin A1C 4.8 - 5.6 % 10.7 (H)   Review of Glycemic Control  Diabetes history: NO Outpatient Diabetes medications: NA Current orders for Inpatient glycemic control: Semglee 10 units BID, Novolog 0-6 units Q4H   Inpatient Diabetes Program Recommendations:     HbgA1C: A1C 10.7% on 06/21/24 indicating an average glucose of 260 mg/dl over the past 2-3 months. Being newly dx with DM this admission.  Outpatient DM: At discharge please provide Rx for glucose monitoring kit (#[id number]) and for FreeStyle Libre 3 Plus sensors (#[id number]) and reader [phone]). If patient is discharged on insulin, insurance covers Lantus pens [phone]) and Novolog pens [phone]); would also need insulin pen needles [phone]).   NOTE: Patient admitted with septic shock, acute diverticulitis, diverticular abscess, HHS, and AKI. Initial lab glucose 401 mg/dl and patient was started in IV insulin which has been transitioned to SQ insulin regimen. Living Well with DM book, insulin starter kit, and RD consult for diet education ordered on 06/22/24.  Will plan to talk with patient today regarding new DM dx   Addendum 06/23/24@11 :50-Spoke with patient and her husband at bedside about new diabetes diagnosis. Patient reports she has never been told she had DM or preDM.  Patient reports that her husband, daughter, and granddaughter have DM so she is knowledgeable about DM and she has given insulin with vial/syringe  to her daughter years ago. Patient reports that her husband takes Tresiba insulin so she has seen him use the insulin pen.   Discussed A1C results (10.7% on 06/21/24) and explained what an A1C is and informed patient that her current A1C indicates an average glucose of 260 mg/dl over the past 2-3 months. Discussed basic pathophysiology of DM Type 2, basic home care, importance of checking CBGs and maintaining good CBG control to prevent long-term and short-term complications. Reviewed glucose and A1C goals.  Discussed glucose monitoring with finger sticks and CGM. Patient reports her husband uses the FreeStyle Libre CGM and she would like to use it if insurance will cover it.  Informed patient that I would have outpatient TOC pharmacy to check and see if it would be covered and also have them see which insulins are covered in case she is discharged home on insulin.  Reviewed signs and symptoms of hyperglycemia and hypoglycemia along with treatment for both. Discussed impact of nutrition, exercise, stress, sickness, and medications on diabetes control. Discussed Carb Modified diet, plate method, and eating carbs in moderation.  Educated patient on insulin pen use at home. Reviewed all steps of insulin pen including attachment of needle, 2-unit air shot, dialing up dose, giving injection, removing needle, disposal of sharps, storage of unused insulin, disposal of insulin etc. Patient able to provide successful return demonstration. Patient verbalized understanding of information discussed and she states that she has no further questions at this time related to diabetes.   RNs to provide ongoing basic DM education at  bedside with this patient and engage patient to actively check blood glucose and administer insulin injections.   Thanks, Earnie Gainer, RN, MSN, CDCES Diabetes Coordinator Inpatient Diabetes Program [phone] (Team Pager from 8am to 5pm)

## 2024-06-23 NOTE — Care Management Important Message (Signed)
 Important Message  Patient Details  Name: Yolanda Gray MRN: [id number] Date of Birth: Nov 06, 1955   Important Message Given:  Yes - Medicare IM     Rojelio SHAUNNA Rattler 06/23/2024, 4:46 PM

## 2024-06-23 NOTE — TOC CM/SW Note (Signed)
 Transition of Care [facility]) CM/SW Note    Transition of Care [facility]) - Inpatient Brief Assessment   Patient Details  Name: Yolanda Gray MRN: [id number] Date of Birth: [birth year]  Transition of Care [facility]) CM/SW Contact:    Alfonso Rummer, LCSW Phone Number: 06/23/2024, 11:53 AM   Clinical Narrative:  LCSW A. Rummer completed toc chart review. No toc needs identified please contact should needs arise   Transition of Care Asessment: Insurance and Status: Insurance coverage has been reviewed Patient has primary care physician: No (pcp list added to avs) Home environment has been reviewed: single family home   Prior/Current Home Services: No current home services Social Drivers of Health Review: SDOH reviewed no interventions necessary Readmission risk has been reviewed: No Transition of care needs: no transition of care needs at this time

## 2024-06-23 NOTE — Consult Note (Signed)
 [facility] CLINIC CARDIOLOGY CONSULT NOTE       Patient ID: Yolanda Gray MRN: [id number] DOB/AGE: [birth year] 67 y.o.  Admit date: 06/21/2024 Referring Physician Dr. Delayne Solian Primary Physician Pcp, No  Primary Cardiologist None Reason for Consultation atrial fibrillation RVR  HPI: Yolanda Gray is a 68 y.o. female  with a past medical history of COPD, tobacco use, polycythemia, obesity who presented to the ED on 06/21/2024 for abdominal pain.  Found to be septic with acute diverticulitis with abscess.  Underwent drain placement yesterday.  Developed atrial fibrillation RVR on the evening of 06/22/2024.  Cardiology was consulted for further evaluation.   Patient initially came to the ED for evaluation due to pain in her side.  Ultimately was found to have acute diverticulitis and was septic, started on IV antibiotics.  Yesterday she had drain placed by IR.  Had onset of atrial fibrillation late yesterday evening.  Notable lab work today includes creatinine 0.87, potassium 4.1, sodium 135, hemoglobin 11.2, WBC 14.3, platelets 267.  TSH WNL at 2.23.  No EKG done yesterday for review.  Telemetry strips from yesterday reveal atrial fibrillation RVR with heart rate in the 130s.  Initially treated with metoprolol  yesterday however developed hypotension and thus was started on IV digoxin.  At the time my evaluation this morning, patient is resting comfortably in hospital bed.  We discussed her hospitalization thus far and episode last night in further detail.  She states that she has not noticed her heart racing and cannot tell that her heart rate has been elevated.  She denies any chest pain, lightheadedness, dizziness.  States that today she only has pain in her abdomen and associated shortness of breath when the pain is worse.  Remains in atrial fibrillation on telemetry with borderline heart rate around 100 bpm.  Review of systems complete and found to be negative unless listed above    Past  Medical History:  Diagnosis Date   COPD (chronic obstructive pulmonary disease) (HCC)    Edentulous    Morbid obesity with BMI of 40.0-44.9, adult (HCC)    Obesity (BMI 30-39.9)    Polycythemia    Thrombocytopenia    Tobacco abuse    Tobacco abuse     Past Surgical History:  Procedure Laterality Date   CATARACT EXTRACTION W/PHACO Left 10/09/2023   Procedure: CATARACT EXTRACTION PHACO AND INTRAOCULAR LENS PLACEMENT (IOC) LEFT MALYUGIN OMIDRIA  13.44 01:09.5;  Surgeon: Enola Feliciano Hugger, MD;  Location: [facility];  Service: Ophthalmology;  Laterality: Left;   CATARACT EXTRACTION W/PHACO Right 10/30/2023   Procedure: PHACOEMULSIFICATION, CATARACT, WITH IOL INSERTION 6.65, 00:53.8;  Surgeon: Enola Feliciano Hugger, MD;  Location: [facility];  Service: Ophthalmology;  Laterality: Right;   CESAREAN SECTION     TONSILLECTOMY      No medications prior to admission.   Social History   Socioeconomic History   Marital status: Married    Spouse name: Not on file   Number of children: Not on file   Years of education: Not on file   Highest education level: Not on file  Occupational History   Not on file  Tobacco Use   Smoking status: Former    Current packs/day: 0.00    Types: Cigarettes    Quit date: 2022    Years since quitting: 3.8   Smokeless tobacco: Never  Vaping Use   Vaping status: Never Used  Substance and Sexual Activity   Alcohol use: Not Currently   Drug use: Never  Sexual activity: Not on file  Other Topics Concern   Not on file  Social History Narrative   Not on file   Social Drivers of Health   Financial Resource Strain: Not on file  Food Insecurity: No Food Insecurity (06/22/2024)   Hunger Vital Sign    Worried About Running Out of Food in the Last Year: Never true    Ran Out of Food in the Last Year: Never true  Transportation Needs: No Transportation Needs (06/22/2024)   PRAPARE - Administrator, Civil Service (Medical):  No    Lack of Transportation (Non-Medical): No  Physical Activity: Not on file  Stress: Not on file  Social Connections: Unknown (06/22/2024)   Social Connection and Isolation Panel    Frequency of Communication with Friends and Family: More than three times a week    Frequency of Social Gatherings with Friends and Family: More than three times a week    Attends Religious Services: More than 4 times per year    Active Member of Clubs or Organizations: Patient declined    Attends Banker Meetings: Patient declined    Marital Status: Not on file  Intimate Partner Violence: Not At Risk (06/22/2024)   Humiliation, Afraid, Rape, and Kick questionnaire    Fear of Current or Ex-Partner: No    Emotionally Abused: No    Physically Abused: No    Sexually Abused: No    Family History  Problem Relation Age of Onset   Asthma Mother    Heart attack Mother    Heart attack Father      Vitals:   06/22/24 2233 06/22/24 2357 06/23/24 0338 06/23/24 1014  BP: 114/78 124/74 110/70 118/80  Pulse:  (!) 128 (!) 109 70  Resp: 19 18 18 18   Temp: 98.1 F (36.7 C) 98.3 F (36.8 C) 98.4 F (36.9 C) 97.8 F (36.6 C)  TempSrc: Oral Oral    SpO2: 90% 94% 91% 95%  Weight:      Height:        PHYSICAL EXAM General: Chronically ill-appearing female, well nourished, in no acute distress. HEENT: Normocephalic and atraumatic. Neck: No JVD.  Lungs: Normal respiratory effort on room air. Clear bilaterally to auscultation. No wheezes, crackles, rhonchi.  Heart: Irregularly irregular, borderline right. Normal S1 and S2 without gallops or murmurs.  Abdomen: Non-distended appearing.  Msk: Normal strength and tone for age. Extremities: Warm and well perfused. No clubbing, cyanosis.  No edema.  Neuro: Alert and oriented X 3. Psych: Answers questions appropriately.   Labs: Basic Metabolic Panel: Recent Labs    06/22/24 0338 06/23/24 0137  NA 132* 135  K 4.2 4.1  CL 98 98  CO2 23 26   GLUCOSE 170* 173*  BUN 22 17  CREATININE 0.97 0.87  CALCIUM 8.2* 8.5*   Liver Function Tests: Recent Labs    06/22/24 0338 06/22/24 2006  AST 23 24  ALT 16 17  ALKPHOS 53 81  BILITOT 0.9 0.5  PROT 7.0 7.1  ALBUMIN 2.9* 3.3*   Recent Labs    06/21/24 1017  LIPASE 21   CBC: Recent Labs    06/22/24 0338 06/23/24 0137  WBC 10.1 14.3*  HGB 10.5* 11.2*  HCT 33.2* 35.6*  MCV 86.9 88.3  PLT 221 267   Cardiac Enzymes: No results for input(s): CKTOTAL, CKMB, CKMBINDEX, TROPONINIHS in the last 72 hours. BNP: No results for input(s): BNP in the last 72 hours. D-Dimer: No results for input(s):  DDIMER in the last 72 hours. Hemoglobin A1C: Recent Labs    06/21/24 1017  HGBA1C 10.7*   Fasting Lipid Panel: No results for input(s): CHOL, HDL, LDLCALC, TRIG, CHOLHDL, LDLDIRECT in the last 72 hours. Thyroid Function Tests: Recent Labs    06/22/24 1810  TSH 2.230   Anemia Panel: No results for input(s): VITAMINB12, FOLATE, FERRITIN, TIBC, IRON, RETICCTPCT in the last 72 hours.   Radiology: CT GUIDED PERITONEAL/RETROPERITONEAL FLUID DRAIN BY PERC CATH Result Date: 06/22/2024 INDICATION: Right lower quadrant diverticular abscess EXAM: CT DRAINAGE OF THE RIGHT LOWER QUADRANT ABSCESS MEDICATIONS: The patient is currently admitted to the hospital and receiving intravenous antibiotics. The antibiotics were administered within an appropriate time frame prior to the initiation of the procedure. ANESTHESIA/SEDATION: Moderate (conscious) sedation was employed during this procedure. A total of Versed  2 mg and Fentanyl  50 mcg was administered intravenously by the radiology nurse. Total intra-service moderate Sedation Time: 12 minutes. The patient's level of consciousness and vital signs were monitored continuously by radiology nursing throughout the procedure under my direct supervision. COMPLICATIONS: None immediate. PROCEDURE: Informed written consent  was obtained from the patient after a thorough discussion of the procedural risks, benefits and alternatives. All questions were addressed. Maximal Sterile Barrier Technique was utilized including caps, mask, sterile gowns, sterile gloves, sterile drape, hand hygiene and skin antiseptic. A timeout was performed prior to the initiation of the procedure. Previous imaging reviewed. Patient positioned supine. Noncontrast localization CT performed. The right lower quadrant diverticular abscess was localized and marked. Under sterile conditions local anesthesia, the 18 gauge 15 cm access needle was advanced from a right lower quadrant anterior oblique approach into the abscess. Needle position confirmed with CT. Syringe aspiration yielded purulent fluid. Sample sent for culture. Guidewire inserted followed by tract dilatation to insert a 10 French drain. Drain catheter position confirmed with CT. Syringe five cc blood tinged purulent fluid. Catheter secured with a silk suture and sterile dressing. Suction bulb applied. No immediate complication. Patient tolerated the procedure well. IMPRESSION: Successful CT-guided right lower quadrant diverticular abscess drain placement. Electronically Signed   By: CHRISTELLA.  Shick M.D.   On: 06/22/2024 13:20   DG Chest Port 1 View Result Date: 06/21/2024 CLINICAL DATA:  Fever. EXAM: PORTABLE CHEST 1 VIEW COMPARISON:  Chest radiograph dated 09/22/2021. FINDINGS: There is cardiomegaly with central vascular congestion. No focal consolidation, pleural effusion, pneumothorax. No acute osseous pathology. IMPRESSION: Cardiomegaly with central vascular congestion. No focal consolidation. Electronically Signed   By: Vanetta Chou M.D.   On: 06/21/2024 13:28   CT ABDOMEN PELVIS W CONTRAST Result Date: 06/21/2024 CLINICAL DATA:  Right lower quadrant abdominal pain. EXAM: CT ABDOMEN AND PELVIS WITH CONTRAST TECHNIQUE: Multidetector CT imaging of the abdomen and pelvis was performed using the  standard protocol following bolus administration of intravenous contrast. RADIATION DOSE REDUCTION: This exam was performed according to the departmental dose-optimization program which includes automated exposure control, adjustment of the mA and/or kV according to patient size and/or use of iterative reconstruction technique. CONTRAST:  80mL OMNIPAQUE IOHEXOL 300 MG/ML  SOLN COMPARISON:  None Available. FINDINGS: Lower chest: No acute abnormality. No intra-abdominal free air.  Small free fluid in the pelvis. Hepatobiliary: Fatty appearing liver. No biliary ductal dilatation. The gallbladder is unremarkable. Pancreas: Unremarkable. No pancreatic ductal dilatation or surrounding inflammatory changes. Spleen: Normal in size without focal abnormality. Adrenals/Urinary Tract: The right adrenal glands unremarkable. There is a 1.5 cm indeterminate left adrenal nodule. There is no hydronephrosis on either side. The visualized  ureters and urinary bladder appear unremarkable. Stomach/Bowel: There is severe sigmoid diverticulosis. There is active inflammatory changes consistent with acute diverticulitis. There is a 5.3 x 4.0 cm complex collection in the right hemipelvis medial to the right ovary containing fluid and air consistent with diverticular abscess. There is a linear tract extending from the distal sigmoid colon to a segment of the sigmoid colon more proximally (series 2 images 56-65) consistent with a fistula. Mildly dilated bowel loop in the left hemiabdomen measures 3.5 cm, likely a reactive ileus. Developing obstruction is less likely but not excluded. The appendix is not visualized with certainty. No inflammatory changes identified in the right lower quadrant. Vascular/Lymphatic: Moderate aortoiliac atherosclerotic disease. The IVC is unremarkable. No portal venous gas. There is no adenopathy. Reproductive: The uterus is anteverted. Uterine fibroids noted. No suspicious adnexal masses. Other: None  Musculoskeletal: Degenerative changes of the spine. No acute osseous pathology. IMPRESSION: 1. Acute sigmoid diverticulitis with a 5.3 x 4.0 cm diverticular abscess in the right hemipelvis. 2. Sigmoid-sigmoid fistula. 3. Mildly dilated bowel loop in the left hemiabdomen, likely a reactive ileus. Developing obstruction is less likely but not excluded. 4. Fatty liver. 5. Uterine fibroids. 6.  Aortic Atherosclerosis (ICD10-I70.0). Electronically Signed   By: Vanetta Chou M.D.   On: 06/21/2024 13:12    ECHO ordered  TELEMETRY (personally reviewed): Atrial fibrillation rate 100s  EKG (personally reviewed): No EKG for review  Data reviewed by me 06/23/2024: last 24h vitals tele labs imaging I/O ED provider note, admission H&P, hospitalist progress note, general surgery notes  Principal Problem:   DKA (diabetic ketoacidosis) (HCC) Active Problems:   Obesity (BMI 30-39.9)   Abscess of sigmoid colon due to diverticulitis   Septic shock (HCC)   AKI (acute kidney injury)   T2DM (type 2 diabetes mellitus) (HCC)    ASSESSMENT AND PLAN:  HENNESSY BARTEL is a 68 y.o. female  with a past medical history of COPD, tobacco use, polycythemia, obesity who presented to the ED on 06/21/2024 for abdominal pain.  Found to be septic with acute diverticulitis with abscess.  Underwent drain placement yesterday.  Developed atrial fibrillation RVR on the evening of 06/22/2024.  Cardiology was consulted for further evaluation.   # Atrial fibrillation RVR # New onset atrial fibrillation # Acute diverticulitis # Sepsis Patient initially presented with abdominal pain found to have acute diverticulitis and was septic.  Has been treated with antibiotics.  On the evening of 06/22/2024 she developed atrial fibrillation RVR.  Initially treated with metoprolol  however became hypotensive, then started on IV digoxin.  Started on IV argatroban for anticoagulation as she has allergy to porcine products. - Echo ordered.  Further  recommendations pending these results. - Continue IV argatroban for anticoagulation.  Will ultimately plan to transition to Eliquis for anticoagulation but will wait as she just had drain placed yesterday and is continuing to have abdominal issues. - Will start IV amiodarone bolus and infusion today.  Stop IV digoxin. - Decrease metoprolol  to 12.5 mg twice daily given hypotension.  Will uptitrate as able.   This patient's plan of care was discussed and created with Dr. Ammon and he is in agreement.  Signed: Danita Bloch, PA-C  06/23/2024, 11:18 AM [facility] Cardiology

## 2024-06-23 NOTE — Progress Notes (Signed)
 Initial Nutrition Assessment  DOCUMENTATION CODES:   Obesity unspecified  INTERVENTION:   -Continue clear liquid diet; RD will follow for diet advancement and add supplements as appropriate -MVI with minerals daily -RD education patient on carb modified diet and DM self-management; patient was able to teach back self-management basics taught by DM coordinator earlier today; RD provided Plate Method and Carbohydrate Counting for People with Diabetes handouts from [facility] Nutrition Care Manual; attached to AVS/ discharge summary; RD informed RN, MD, and DM coordinator that education has been completed -RD referred patient to [facility] for additional support and reinforcement  NUTRITION DIAGNOSIS:   Increased nutrient needs related to acute illness as evidenced by estimated needs.  GOAL:   Patient will meet greater than or equal to 90% of their needs  MONITOR:   PO intake, Supplement acceptance, Diet advancement  REASON FOR ASSESSMENT:   Consult Assessment of nutrition requirement/status, Diet education  ASSESSMENT:   68 year old female with history of polycythemia, COPD, obesity, ongoing tobacco abuse who was admitted due to complaints of diffuse abdominal pain.  Patient was found to have high blood sugar later CT scan of the abdomen showed sigmoid diverticular abscess.  Patient admitted with DKA (new onset DM) and diverticular perforation with abscess.   11/11- s/p RLQ abscess drain placement  Reviewed I/O's: +2.4 L x 24 hours and +6.3 L since admission  UOP: 200 ml x 24 hours  Drain output: 30 ml x 24 hours  Per general surgery notes, drain with minimal output. Patient advanced to clear liquid diet.   Spoke with patient and bedside, both pleasant and in good spirits today. Pt sitting up, smiling and drinking clear liquid. Patient states that she has been better, but is grateful to eat.   Patient reports good appetite  PTA. Patient states she eats anything and everything and does not follow a structured meal pattern at home. Favorite foods include pizza and chicken, rice, and vegetables. She has good support from her family, including her daughter, who often cooks for her. She shares her husband. Daughter, husband, and granddaughter have DM and is well versed  in DM management. Patinet reports she stopped drinking sodas approximately 3 weeks PTA and thinks she has hypertensive to sweetness due to this (states ice pop and juice are too sweet). RD praised patient for her efforts.   Patient endorses intentional weight loss due to lifestyle modifications. Reviewed weight history; patient has experienced a 7.2% weight loss over the past 8 months, which is not significant for time frame.   RD provided Carbohydrate Counting for People with Diabetes handout from the [employer]. Discussed different food groups and their effects on blood sugar, emphasizing carbohydrate-containing foods. Provided list of carbohydrates and recommended serving sizes of common foods.  Discussed importance of controlled and consistent carbohydrate intake throughout the day. Provided examples of ways to balance meals/snacks and encouraged intake of high-fiber, whole grain complex carbohydrates. Teach back method used.  Expect fair to good compliance.Noted plan to discharge home on insulin.   RD informed DM coordinator, RN, and MD that education has been provided.   Medications reviewed and include protonix.   Lab Results  Component Value Date   HGBA1C 10.7 (H) 06/21/2024   PTA DM medications are none.   Labs reviewed: CBGS: 114-181 (inpatient orders for glycemic control are 0-6 units insulin aspart every 4 hours and 10 units insulin glargine-yfgn daily).    NUTRITION - FOCUSED PHYSICAL EXAM:  Flowsheet Row Most Recent Value  Orbital Region No depletion  Upper Arm Region No depletion  Thoracic and Lumbar Region  No depletion  Buccal Region No depletion  Temple Region No depletion  Clavicle Bone Region No depletion  Clavicle and Acromion Bone Region No depletion  Scapular Bone Region No depletion  Dorsal Hand No depletion  Patellar Region No depletion  Anterior Thigh Region No depletion  Posterior Calf Region No depletion  Edema (RD Assessment) Mild  Hair Reviewed  Eyes Reviewed  Mouth Reviewed  Skin Reviewed  Nails Reviewed    Diet Order:   Diet Order             Diet clear liquid Room service appropriate? Yes; Fluid consistency: Thin  Diet effective now                   EDUCATION NEEDS:   Education needs have been addressed  Skin:  Skin Assessment: Reviewed RN Assessment  Last BM:  06/21/24  Height:   Ht Readings from Last 1 Encounters:  06/21/24 5' 3 (1.6 m)    Weight:   Wt Readings from Last 1 Encounters:  06/21/24 100.6 kg    Ideal Body Weight:  52.3 kg  BMI:  Body mass index is 39.27 kg/m.  Estimated Nutritional Needs:   Kcal:  1600-1800  Protein:  85-100 grams  Fluid:  1.6-1.8 L    Margery ORN, RD, LDN, CDCES Registered Dietitian III Certified Diabetes Care and Education Specialist If unable to reach this RD, please use RD Inpatient group chat on secure chat between hours of 8am-4 pm daily

## 2024-06-23 NOTE — Consult Note (Addendum)
 PHARMACY - ANTICOAGULATION CONSULT NOTE  Pharmacy Consult for Argatroban Indication: new onset atrial fibrillation  Allergies  Allergen Reactions   Porcine (Pork) Protein-Containing Drug Products     Patient Measurements: Height: 5' 3 (160 cm) Weight: 100.6 kg (221 lb 11.2 oz) IBW/kg (Calculated) : 52.4 HEPARIN DW (KG): 76  Vital Signs: Temp: 98.4 F (36.9 C) (11/12 0338) Temp Source: Oral (11/11 2357) BP: 110/70 (11/12 0338) Pulse Rate: 109 (11/12 0338)  Labs: Recent Labs    06/21/24 1017 06/21/24 1220 06/21/24 1553 06/22/24 0338 06/22/24 2006 06/23/24 0137 06/23/24 0558  HGB 13.6  --   --  10.5*  --  11.2*  --   HCT 44.1  --   --  33.2*  --  35.6*  --   PLT 311  --   --  221  --  267  --   APTT  --   --   --   --  30 85* 110*  CREATININE 1.34*   < > 1.09* 0.97  --  0.87  --    < > = values in this interval not displayed.    Estimated Creatinine Clearance: 72 mL/min (by C-G formula based on SCr of 0.87 mg/dL).   Medical History: Past Medical History:  Diagnosis Date   COPD (chronic obstructive pulmonary disease) (HCC)    Edentulous    Morbid obesity with BMI of 40.0-44.9, adult (HCC)    Obesity (BMI 30-39.9)    Polycythemia    Thrombocytopenia    Tobacco abuse    Tobacco abuse     Medications:  No history of chronic anticoagulant use PTA.  Assessment: 68 y.o. female with medical history significant for obesity, COPD not on home oxygen, ongoing active smoking, polycythemia who came into ED from home complaining of right lower quadrant abdominal pain intermittent for 2 months. Per note from attending, alerted by nurse to new tachycardia to 160s. Appears to be a-fib on tele and EKG confirms this. Per patient this is a new diagnosis. 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 Pharmacy consulted to initiate and dose argatroban infusion. Baseline labs have been ordered and are  pending.   Goal of Therapy:  aPTT 50-90 seconds Monitor platelets by anticoagulation protocol: Yes  11/12 0137 aPTT 85, therapeutic x 1 11/12 0558 aPTT 110, supratherapeutic   Plan:  - Contacted RN.  Hold infusion for 30 minutes - Decrease argatroban rate to 0.67 mcg/kg/min - recheck aPTT 4 hours after restart - CBC/H&H daily - plan to transition to DOAC once afib is controlled  Rankin CANDIE Dills, PharmD, [facility] 06/23/2024 7:06 AM

## 2024-06-23 NOTE — Consult Note (Signed)
 PHARMACY - ANTICOAGULATION CONSULT NOTE  Pharmacy Consult for Argatroban Indication: new onset atrial fibrillation  Allergies  Allergen Reactions   Porcine (Pork) Protein-Containing Drug Products     Patient Measurements: Height: 5' 3 (160 cm) Weight: 100.6 kg (221 lb 11.2 oz) IBW/kg (Calculated) : 52.4 HEPARIN DW (KG): 76  Vital Signs: Temp: 97.8 F (36.6 C) (11/12 2322) Temp Source: Oral (11/12 2322) BP: 119/74 (11/12 2322) Pulse Rate: 119 (11/12 2322)  Labs: Recent Labs    06/21/24 1017 06/21/24 1220 06/21/24 1553 06/22/24 0338 06/22/24 2006 06/23/24 0137 06/23/24 0558 06/23/24 1204 06/23/24 1859 06/23/24 2239  HGB 13.6  --   --  10.5*  --  11.2*  --   --   --   --   HCT 44.1  --   --  33.2*  --  35.6*  --   --   --   --   PLT 311  --   --  221  --  267  --   --   --   --   APTT  --   --   --   --    < > 85*   < > 104* 92* 87*  CREATININE 1.34*   < > 1.09* 0.97  --  0.87  --   --   --   --    < > = values in this interval not displayed.    Estimated Creatinine Clearance: 72 mL/min (by C-G formula based on SCr of 0.87 mg/dL).   Medical History: Past Medical History:  Diagnosis Date   COPD (chronic obstructive pulmonary disease) (HCC)    Edentulous    Morbid obesity with BMI of 40.0-44.9, adult (HCC)    Obesity (BMI 30-39.9)    Polycythemia    Thrombocytopenia    Tobacco abuse    Tobacco abuse     Medications:  No history of chronic anticoagulant use PTA.  Assessment: 68 y.o. female with medical history significant for obesity, COPD not on home oxygen, ongoing active smoking, polycythemia who came into ED from home complaining of right lower quadrant abdominal pain intermittent for 2 months. Per note from attending, alerted by nurse to new tachycardia to 160s. Appears to be a-fib on tele and EKG confirms this. Per patient this is a new diagnosis. CHADSAVSAc 3 (MD, female, age). CBC remains stable. She is asymptomatic, has a history of anaphylaxis and  hives when given porcine(pork) protein-containing drug products. Limited data with argatroban with afib. Pharmacy consulted to initiate and dose argatroban infusion.    Goal of Therapy:  aPTT 50-90 seconds Monitor platelets by anticoagulation protocol: Yes  11/12 0137 aPTT 85, therapeutic x 1 11/12 0558 aPTT 110, supratherapeutic 11/12 1204  aPTT 104, supratherapeutic 11/12 1900 aPTT 92,  Supratherpeautic 11/12 2319 aPTT 87 Therapeutic x 1    Plan:  aPTT is supratherapeutic. Continue infusion at 0.37 mcg/kg/hr. Recheck aPTT in 4 hours to confirm. CBC daily while on argatroban.   Rankin CANDIE Dills, PharmD, [facility] 06/23/2024 11:37 PM

## 2024-06-23 NOTE — Telephone Encounter (Signed)
 Pharmacy Patient Advocate Encounter  Insurance verification completed.    The patient is insured through [employer]. Patient has Medicare and is not eligible for a copay card, but may be able to apply for patient assistance or Medicare RX Payment Plan (Patient Must reach out to their plan, if eligible for payment plan), if available.    Ran test claim for Lantus 100 unit Pen and the current 30 day co-pay is $35.  Ran test claim for Novolog 100 unit Pen and the current 30 day co-pay is $35.  Ran test claim for Dexcom G7 Sensor and it requires a PA  Ran test claim for [employer] 3 Plus and it requires a PA   This test claim was processed through [employer]- copay amounts may vary at other pharmacies due to [employer], or as the patient moves through the different stages of their insurance plan.

## 2024-06-23 NOTE — Plan of Care (Signed)
  Problem: Education: Goal: Ability to describe self-care measures that may prevent or decrease complications (Diabetes Survival Skills Education) will improve Outcome: Progressing   Problem: Coping: Goal: Ability to adjust to condition or change in health will improve Outcome: Progressing   Problem: Metabolic: Goal: Ability to maintain appropriate glucose levels will improve Outcome: Progressing   Problem: Nutritional: Goal: Maintenance of adequate nutrition will improve Outcome: Progressing   Problem: Tissue Perfusion: Goal: Adequacy of tissue perfusion will improve Outcome: Progressing   Problem: Cardiac: Goal: Ability to maintain an adequate cardiac output will improve Outcome: Progressing   Problem: Respiratory: Goal: Will regain and/or maintain adequate ventilation Outcome: Progressing

## 2024-06-23 NOTE — Consult Note (Signed)
 PHARMACY - ANTICOAGULATION CONSULT NOTE  Pharmacy Consult for Argatroban Indication: new onset atrial fibrillation  Allergies  Allergen Reactions   Porcine (Pork) Protein-Containing Drug Products     Patient Measurements: Height: 5' 3 (160 cm) Weight: 100.6 kg (221 lb 11.2 oz) IBW/kg (Calculated) : 52.4 HEPARIN DW (KG): 76  Vital Signs: Temp: 98.3 F (36.8 C) (11/11 2357) Temp Source: Oral (11/11 2357) BP: 124/74 (11/11 2357) Pulse Rate: 128 (11/11 2357)  Labs: Recent Labs    06/21/24 1017 06/21/24 1220 06/21/24 1553 06/22/24 0338 06/22/24 2006 06/23/24 0137  HGB 13.6  --   --  10.5*  --  11.2*  HCT 44.1  --   --  33.2*  --  35.6*  PLT 311  --   --  221  --  267  APTT  --   --   --   --  30 85*  CREATININE 1.34*   < > 1.09* 0.97  --  0.87   < > = values in this interval not displayed.    Estimated Creatinine Clearance: 72 mL/min (by C-G formula based on SCr of 0.87 mg/dL).   Medical History: Past Medical History:  Diagnosis Date   COPD (chronic obstructive pulmonary disease) (HCC)    Edentulous    Morbid obesity with BMI of 40.0-44.9, adult (HCC)    Obesity (BMI 30-39.9)    Polycythemia    Thrombocytopenia    Tobacco abuse    Tobacco abuse     Medications:  No history of chronic anticoagulant use PTA.  Assessment: 68 y.o. female with medical history significant for obesity, COPD not on home oxygen, ongoing active smoking, polycythemia who came into ED from home complaining of right lower quadrant abdominal pain intermittent for 2 months. Per note from attending, alerted by nurse to new tachycardia to 160s. Appears to be a-fib on tele and EKG confirms this. Per patient this is a new diagnosis. 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 Pharmacy consulted to initiate and dose argatroban infusion. Baseline labs have been ordered and are pending.   Goal of Therapy:  aPTT 50-90  seconds Monitor platelets by anticoagulation protocol: Yes  11/12 0137 aPTT 85, therapeutic x 1   Plan:  - continue argatroban at 1mcg/kg/min - recheck aPTT 4 hours to confirm - CBC/H&H daily - plan to transition to DOAC once afib is controlled  Rankin CANDIE Dills, PharmD, [facility] 06/23/2024 3:03 AM

## 2024-06-23 NOTE — Telephone Encounter (Signed)
 Pharmacy Patient Advocate Encounter  Insurance verification completed.    The patient is insured through [employer]. Patient has Medicare and is not eligible for a copay card, but may be able to apply for patient assistance or Medicare RX Payment Plan (Patient Must reach out to their plan, if eligible for payment plan), if available.    Ran test claim for Eliquis 5mg  and the current 30 day co-pay is $497.  Ran test claim for Xarelto 20mg  and the current 30 day co-pay is $497.  This test claim was processed through [facility]- copay amounts may vary at other pharmacies due to pharmacy/plan contracts, or as the patient moves through the different stages of their insurance plan.

## 2024-06-23 NOTE — Progress Notes (Signed)
   06/22/24 2233  Assess: MEWS Score  Temp 98.1 F (36.7 C)  BP 114/78  MAP (mmHg) 86  ECG Heart Rate (!) 158  Resp 19  Level of Consciousness Alert  SpO2 90 %  O2 Device Room Air  Assess: MEWS Score  MEWS Temp 0  MEWS Systolic 0  MEWS Pulse 3  MEWS RR 0  MEWS LOC 0  MEWS Score 3  MEWS Score Color Yellow  Assess: if the MEWS score is Yellow or Red  Were vital signs accurate and taken at a resting state? Yes  Does the patient meet 2 or more of the SIRS criteria? No  MEWS guidelines implemented  No, previously yellow, continue vital signs every 4 hours  Notify: Charge Nurse/RN  Name of Charge Nurse/RN Notified Eleonor RN  Provider Notification  Provider Name/Title Delayne Solian MD  Date Provider Notified 06/22/24  Time Provider Notified 2243  Method of Notification  (Secure chat)  Notification Reason Other (Comment) (Sustained HR of 150's, Soft BP.)  Provider response See new orders (Digoxin IV, NS bolus 1L)  Date of Provider Response 06/22/24  Time of Provider Response 2255  Assess: SIRS CRITERIA  SIRS Temperature  0  SIRS Respirations  0  SIRS Pulse 1  SIRS WBC 0  SIRS Score Sum  1

## 2024-06-23 NOTE — Discharge Instructions (Signed)
 Plate Method for Diabetes   Foods with carbohydrates make your blood glucose level go up. The plate method is a simple way to meal plan and control the amount of carbohydrate you eat.         Use the following guidance to build a healthy plate to control carbohydrates. Divide a 9-inch plate into 3 sections, and consider your beverage the 4th section of your meal: Food Group Examples of Foods/Beverages for This Section of your Meal  Section 1: Non-starchy vegetables Fill  of your plate to include non-starchy vegetables Asparagus, broccoli, brussels sprouts, cabbage, carrots, cauliflower, celery, cucumber, green beans, mushrooms, peppers, salad greens, tomatoes, or zucchini.  Section 2: Protein foods Fill  of your plate to include a lean protein Lean meat, poultry, fish, seafood, cheese, eggs, lean deli meat, tofu, beans, lentils, nuts or nut butters.  Section 3: Carbohydrate foods Fill  of your plate to include carbohydrate foods Whole grains, whole wheat bread, brown rice, whole grain pasta, polenta, corn tortillas, fruit, or starchy vegetables (potatoes, green peas, corn, beans, acorn squash, and butternut squash). One cup of milk also counts as a food that contains carbohydrate.  Section 4: Beverage Choose water or a low-calorie drink for your beverage. Unsweetened tea, coffee, or flavored/sparkling water without added sugar.  Image reprinted with permission from The American Diabetes Association.  Copyright 2022 by the American Diabetes Association.   Copyright 2022  Academy of Nutrition and Dietetics. All rights reserved    Carbohydrate Counting For People With Diabetes  Foods with carbohydrates make your blood glucose level go up. Learning how to count carbohydrates can help you control your blood glucose levels. First, identify the foods you eat that contain carbohydrates. Then, using the Foods with Carbohydrates chart, determine about how much carbohydrates are in your meals and  snacks. Make sure you are eating foods with fiber, protein, and healthy fat along with your carbohydrate foods. Foods with Carbohydrates The following table shows carbohydrate foods that have about 15 grams of carbohydrate each. Using measuring cups, spoons, or a food scale when you first begin learning about carbohydrate counting can help you learn about the portion sizes you typically eat. The following foods have 15 grams carbohydrate each:  Grains 1 slice bread (1 ounce)  1 small tortilla (6-inch size)   large bagel (1 ounce)  1/3 cup pasta or rice (cooked)   hamburger or hot dog bun ( ounce)   cup cooked cereal   to  cup ready-to-eat cereal  2 taco shells (5-inch size) Fruit 1 small fresh fruit ( to 1 cup)   medium banana  17 small grapes (3 ounces)  1 cup melon or berries   cup canned or frozen fruit  2 tablespoons dried fruit (blueberries, cherries, cranberries, raisins)   cup unsweetened fruit juice  Starchy Vegetables  cup cooked beans, peas, corn, potatoes/sweet potatoes   large baked potato (3 ounces)  1 cup acorn or butternut squash  Snack Foods 3 to 6 crackers  8 potato chips or 13 tortilla chips ( ounce to 1 ounce)  3 cups popped popcorn  Dairy 3/4 cup (6 ounces) nonfat plain yogurt, or yogurt with sugar-free sweetener  1 cup milk  1 cup plain rice, soy, coconut or flavored almond milk Sweets and Desserts  cup ice cream or frozen yogurt  1 tablespoon jam, jelly, pancake syrup, table sugar, or honey  2 tablespoons light pancake syrup  1 inch square of frosted cake or 2 inch square of  unfrosted cake  2 small cookies (2/3 ounce each) or  large cookie  Sometimes you'll have to estimate carbohydrate amounts if you don't know the exact recipe. One cup of mixed foods like soups can have 1 to 2 carbohydrate servings, while some casseroles might have 2 or more servings of carbohydrate. Foods that have less than 20 calories in each serving can be counted as  "free" foods. Count 1 cup raw vegetables, or  cup cooked non-starchy vegetables as "free" foods. If you eat 3 or more servings at one meal, then count them as 1 carbohydrate serving.  Foods without Carbohydrates  Not all foods contain carbohydrates. Meat, some dairy, fats, non-starchy vegetables, and many beverages don't contain carbohydrate. So when you count carbohydrates, you can generally exclude chicken, pork, beef, fish, seafood, eggs, tofu, cheese, butter, sour cream, avocado, nuts, seeds, olives, mayonnaise, water, black coffee, unsweetened tea, and zero-calorie drinks. Vegetables with no or low carbohydrate include green beans, cauliflower, tomatoes, and onions. How much carbohydrate should I eat at each meal?  Carbohydrate counting can help you plan your meals and manage your weight. Following are some starting points for carbohydrate intake at each meal. Work with your registered dietitian nutritionist to find the best range that works for your blood glucose and weight.   To Lose Weight To Maintain Weight  Women 2 - 3 carb servings 3 - 4 carb servings  Men 3 - 4 carb servings 4 - 5 carb servings  Checking your blood glucose after meals will help you know if you need to adjust the timing, type, or number of carbohydrate servings in your meal plan. Achieve and keep a healthy body weight by balancing your food intake and physical activity.  Tips How should I plan my meals?  Plan for half the food on your plate to include non-starchy vegetables, like salad greens, broccoli, or carrots. Try to eat 3 to 5 servings of non-starchy vegetables every day. Have a protein food at each meal. Protein foods include chicken, fish, meat, eggs, or beans (note that beans contain carbohydrate). These two food groups (non-starchy vegetables and proteins) are low in carbohydrate. If you fill up your plate with these foods, you will eat less carbohydrate but still fill up your stomach. Try to limit your carbohydrate  portion to  of the plate.  What fats are healthiest to eat?  Diabetes increases risk for heart disease. To help protect your heart, eat more healthy fats, such as olive oil, nuts, and avocado. Eat less saturated fats like butter, cream, and high-fat meats, like bacon and sausage. Avoid trans fats, which are in all foods that list "partially hydrogenated oil" as an ingredient. What should I drink?  Choose drinks that are not sweetened with sugar. The healthiest choices are water, carbonated or seltzer waters, and tea and coffee without added sugars.  Sweet drinks will make your blood glucose go up very quickly. One serving of soda or energy drink is  cup. It is best to drink these beverages only if your blood glucose is low.  Artificially sweetened, or diet drinks, typically do not increase your blood glucose if they have zero calories in them. Read labels of beverages, as some diet drinks do have carbohydrate and will raise your blood glucose. Label Reading Tips Read Nutrition Facts labels to find out how many grams of carbohydrate are in a food you want to eat. Don't forget: sometimes serving sizes on the label aren't the same as how much food  you are going to eat, so you may need to calculate how much carbohydrate is in the food you are serving yourself.   Carbohydrate Counting for People with Diabetes Sample 1-Day Menu  Breakfast  cup yogurt, low fat, low sugar (1 carbohydrate serving)   cup cereal, ready-to-eat, unsweetened (1 carbohydrate serving)  1 cup strawberries (1 carbohydrate serving)   cup almonds ( carbohydrate serving)  Lunch 1, 5 ounce can chunk light tuna  2 ounces cheese, low fat cheddar  6 whole wheat crackers (1 carbohydrate serving)  1 small apple (1 carbohydrate servings)   cup carrots ( carbohydrate serving)   cup snap peas  1 cup 1% milk (1 carbohydrate serving)   Evening Meal Stir fry made with: 3 ounces chicken  1 cup brown rice (3 carbohydrate servings)    cup broccoli ( carbohydrate serving)   cup green beans   cup onions  1 tablespoon olive oil  2 tablespoons teriyaki sauce ( carbohydrate serving)  Evening Snack 1 extra small banana (1 carbohydrate serving)  1 tablespoon peanut butter   Carbohydrate Counting for People with Diabetes Vegan Sample 1-Day Menu  Breakfast 1 cup cooked oatmeal (2 carbohydrate servings)   cup blueberries (1 carbohydrate serving)  2 tablespoons flaxseeds  1 cup soymilk fortified with calcium and vitamin D  1 cup coffee  Lunch 2 slices whole wheat bread (2 carbohydrate servings)   cup baked tofu   cup lettuce  2 slices tomato  2 slices avocado   cup baby carrots ( carbohydrate serving)  1 orange (1 carbohydrate serving)  1 cup soymilk fortified with calcium and vitamin D   Evening Meal Burrito made with: 1 6-inch corn tortilla (1 carbohydrate serving)  1 cup refried vegetarian beans (2 carbohydrate servings)   cup chopped tomatoes   cup lettuce   cup salsa  1/3 cup brown rice (1 carbohydrate serving)  1 tablespoon olive oil for rice   cup zucchini   Evening Snack 6 small whole grain crackers (1 carbohydrate serving)  2 apricots ( carbohydrate serving)   cup unsalted peanuts ( carbohydrate serving)    Carbohydrate Counting for People with Diabetes Vegetarian (Lacto-Ovo) Sample 1-Day Menu  Breakfast 1 cup cooked oatmeal (2 carbohydrate servings)   cup blueberries (1 carbohydrate serving)  2 tablespoons flaxseeds  1 egg  1 cup 1% milk (1 carbohydrate serving)  1 cup coffee  Lunch 2 slices whole wheat bread (2 carbohydrate servings)  2 ounces low-fat cheese   cup lettuce  2 slices tomato  2 slices avocado   cup baby carrots ( carbohydrate serving)  1 orange (1 carbohydrate serving)  1 cup unsweetened tea  Evening Meal Burrito made with: 1 6-inch corn tortilla (1 carbohydrate serving)   cup refried vegetarian beans (1 carbohydrate serving)   cup tomatoes   cup lettuce    cup salsa  1/3 cup brown rice (1 carbohydrate serving)  1 tablespoon olive oil for rice   cup zucchini  1 cup 1% milk (1 carbohydrate serving)  Evening Snack 6 small whole grain crackers (1 carbohydrate serving)  2 apricots ( carbohydrate serving)   cup unsalted peanuts ( carbohydrate serving)    Copyright 2020  Academy of Nutrition and Dietetics. All rights reserved.  Using Nutrition Labels: Carbohydrate  Serving Size  Look at the serving size. All the information on the label is based on this portion. Servings Per Container  The number of servings contained in the package. Guidelines for Carbohydrate  Look at  the total grams of carbohydrate in the serving size.  1 carbohydrate choice = 15 grams of carbohydrate. Range of Carbohydrate Grams Per Choice  Carbohydrate Grams/Choice Carbohydrate Choices  6-10   11-20 1  21-25 1  26-35 2  36-40 2  41-50 3  51-55 3  56-65 4  66-70 4  71-80 5    Copyright 2020  Academy of Nutrition and Dietetics. All rights reserved.    Interventional Radiology Percutaneous Abscess Drain Placement After Care   This sheet gives you information about how to care for yourself after your procedure. Your health care provider may also give you more specific instructions. Your drain was placed by an interventional radiologist with [facility]. If you have questions or concerns, contact [facility] at [phone].   What is a percutaneous drain?   A drain is a small plastic tube (catheter) that goes into the fluid collection in your body through your skin.   How long will I need the drain?   How long the drain needs to stay in is determined by where the drain is, how much comes out of the drain each day and if you are having any other surgical procedures.   Interventional radiology will determine when it is time to remove the drain. It is important to follow up as directed so that the drain can be removed as soon as it  is safe to do so.   What can I expect after the procedure?   After the procedure, it is common to have:   A small amount of bruising and discomfort in the area where the drainage tube (catheter) was placed.   Sleepiness and fatigue. This should go away after the medicines you were given have worn off.   Follow these instructions at home:   Insertion site care   Check your insertion site when you change the bandage. Check for:   More redness, swelling, or pain.   More fluid or blood.   Warmth.   Pus or a bad smell.   When caring for your insertion site:   Wash your hands with soap and water for at least 20 seconds before and after you change your bandage (dressing). If soap and water are not available, use hand sanitizer.   You do not need to change your dressing everyday if it is clean and dry. Change your dressing every 3 days or as needed when it is soiled, wet or becoming dislodged. You will need to change your dressing each time you shower.   Leave stitches (sutures), skin glue, or adhesive strips in place. These skin closures may need to stay in place for 2 weeks or longer. If adhesive strip edges start to loosen and curl up, you may trim the loose edges. Do not remove adhesive strips completely unless your health care provider tells you to do so.   Catheter care   Flush the catheter once per day with 5 mL of 0.9% normal saline unless you are told otherwise by your healthcare provider. This helps to prevent clogs in the catheter.   To disconnect the drain, turn the clear plastic tube to the left. Attach the saline syringe by placing it on the white end of the drain and turning gently to the right. Once attached gently push the plunger to the 5 mL mark. After you are done flushing, disconnect the syringe by turning to the left and reattach your drainage container   If you have a bulb please  be sure the bulb is charged after reconnecting it - to do this pinch the bulb between  your thumb and first finger and close the stopper located on the top of the bulb.    Check for fluid leaking from around your catheter (instead of fluid draining through your catheter). This may be a sign that the drain is no longer working correctly.   Write down the following information every time you empty your bag:   The date and time.   The amount of drainage.   Activity   Rest at home for 1-2 days after your procedure.   For the first 48 hours do not lift anything more than 10 lbs (about a gallon of milk). You may perform moderate activities/exercise. Please avoid strenuous activities during this time.   Avoid any activities which may pull on your drain as this can cause your drain to become dislodged.   If you were given a sedative during the procedure, it can affect you for several hours. Do not drive or operate machinery until your health care provider says that it is safe.   General instructions   For mild pain take over-the-counter medications as needed for pain such as Tylenol or Advil. If you are experiencing severe pain please call our office as this may indicate an issue with your drain.    If you were prescribed an antibiotic medicine, take it as told by your health care provider. Do not stop using the antibiotic even if you start to feel better.   You may shower 24 hours after the drain is placed. To do this cover the insertion site with a water tight material such as saran wrap and seal the edges with tape, you may also purchase waterproof dressings at your local drug store. Shower as usual and then remove the water tight dressing and any gauze/tape underneath it once you have exited the shower and dried off. Allow the area to air dry or pat dry with a clean towel. Once the skin is completely dry place a new gauze dressing. It is important to keep the site dry at all times to prevent infection.   Do not submerge the drain - this means you cannot take baths, swim, use a  hot tub, etc. until the drain is removed.    Do not use any products that contain nicotine or tobacco, such as cigarettes, e-cigarettes, and chewing tobacco. If you need help quitting, ask your health care provider.   Keep all follow-up visits as told by your health care provider. This is important.   Contact a health care provider if:   You have less than 10 mL of drainage a day for 2-3 days in a row, or as directed by your health care provider.   You have any of these signs of infection:   More redness, swelling, or pain around your incision area.   More fluid or blood coming from your incision area.   Warmth coming from your incision area.   Pus or a bad smell coming from your incision area.   You have fluid leaking from around your catheter (instead of through your catheter).   You are unable to flush the drain.   You have a fever or chills.   You have pain that does not get better with medicine.   You have not been contacted to schedule a drain follow up appointment within 10 days of discharge from the hospital.   Please call [facility] at [facility]  [phone]) [phone] with any questions or concerns.   Get help right away if:   Your catheter comes out.   You suddenly stop having drainage from your catheter.   You suddenly have blood in the fluid that is draining from your catheter.   You become dizzy or you faint.   You develop a rash.   You have nausea or vomiting.   You have difficulty breathing or you feel short of breath.   You develop chest pain.   You have problems with your speech or vision.   You have trouble balancing or moving your arms or legs.   Summary   It is common to have a small amount of bruising and discomfort in the area where the drainage tube (catheter) was placed. You may also have minor discomfort with movement while the drain is in place.   Flush the drain once per day with 5 mL of 0.9% normal saline (unless you were told otherwise by  your healthcare provider).    Record the amount of drainage from the bag every time you empty it.   Change the dressing every 3 days or earlier if soiled/wet. Keep the skin dry under the dressing.   You may shower with the drain in place. Do not submerge the drain (no baths, swimming, hot tubs, etc.).   Contact [facility] at [phone] if you have more redness, swelling, or pain around your incision area or if you have pain that does not get better with medicine.   You will get a call from our schedulers in the next 2 weeks to schedule your follow up appointment at [facility] (located at [address], [address], [place], [place] [address])  This information is not intended to replace advice given to you by your health care provider. Make sure you discuss any questions you have with your health care provider.   Interventional Radiology Drain Record   Empty your drain at least once per day. You may empty it as often as needed. Use this form to write down the amount of fluid that has collected in the drainage container. Bring this form with you to your follow-up visits. Please call [facility] at [phone] with any questions or concerns prior to your appointment.   Drain #1 location: ___________________   Date __________ Time __________ Amount __________   Date __________ Time __________ Amount __________   Date __________ Time __________ Amount __________   Date __________ Time __________ Amount __________   Date __________ Time __________ Amount __________   Date __________ Time __________ Amount __________   Date __________ Time __________ Amount __________   Date __________ Time __________ Amount __________   Date __________ Time __________ Amount __________   Date __________ Time __________ Amount __________   Date __________ Time __________ Amount __________   Date __________ Time __________ Amount __________   Date __________  Time __________ Amount __________   Date __________ Time __________ Amount __________

## 2024-06-23 NOTE — Progress Notes (Signed)
 [facility]- General Surgery  SURGICAL PROGRESS NOTE  Hospital Day(s): 2.   Interval History:  Patient is status post day 1 of percutaneous drain placement by IR.  Admits her pain is better but has not resolved completely.  She is now experiencing pain on the left side of abdomen rather than the right.  Output in drain was minimal this morning.  Recorded output in the last 24 hours was 30 cc. Patient went into A-fib RVR last night which is a new diagnosis.  She has been started on metoprolol  and amiodarone in addition to argatroban drip. Possible cardioversion today.   Vital signs in last 24 hours: [min-max] current  Temp:  [98.1 F (36.7 C)-101.3 F (38.5 C)] 98.4 F (36.9 C) (11/12 0338) Pulse Rate:  [72-166] 109 (11/12 0338) Resp:  [15-36] 18 (11/12 0338) BP: (84-148)/(64-85) 110/70 (11/12 0338) SpO2:  [90 %-97 %] 91 % (11/12 0338)     Height: 5' 3 (160 cm) Weight: 100.6 kg BMI (Calculated): 39.28   Intake/Output last 2 shifts:  11/11 0701 - 11/12 0700 In: 2636.7 [P.O.:100; I.V.:1436; IV Piggyback:1100.7] Out: 230 [Urine:200; Drains:30]   Physical Exam:  Constitutional: alert, cooperative and no distress  Respiratory: breathing non-labored at rest  Cardiovascular: regular rate and sinus rhythm  Gastrointestinal: soft, TTP on left side of abdomen, and mildly distended, perc drain in place with minimal cloudy output in drain   Labs:     Latest Ref Rng & Units 06/23/2024    1:37 AM 06/22/2024    3:38 AM 06/21/2024   10:17 AM  CBC  WBC 4.0 - 10.5 K/uL 14.3  10.1  9.1   Hemoglobin 12.0 - 15.0 g/dL 88.7  89.4  86.3   Hematocrit 36.0 - 46.0 % 35.6  33.2  44.1   Platelets 150 - 400 K/uL 267  221  311       Latest Ref Rng & Units 06/23/2024    1:37 AM 06/22/2024    8:06 PM 06/22/2024    3:38 AM  CMP  Glucose 70 - 99 mg/dL 826   829   BUN 8 - 23 mg/dL 17   22   Creatinine 9.55 - 1.00 mg/dL 9.12   9.02   Sodium 864 - 145 mmol/L 135   132   Potassium 3.5 - 5.1 mmol/L  4.1   4.2   Chloride 98 - 111 mmol/L 98   98   CO2 22 - 32 mmol/L 26   23   Calcium 8.9 - 10.3 mg/dL 8.5   8.2   Total Protein 6.5 - 8.1 g/dL  7.1  7.0   Total Bilirubin 0.0 - 1.2 mg/dL  0.5  0.9   Alkaline Phos 38 - 126 U/L  81  53   AST 15 - 41 U/L  24  23   ALT 0 - 44 U/L  17  16     Imaging studies: No new pertinent imaging studies   Assessment/Plan:  68 y.o. 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    - Still tachycardic, no fever with recent normal blood pressures  - Leukocytosis 10.1 >> 14.3, will continue to monitor   - Output from drain has been minimal. Pain level has improved but now presenting on left-side. It could be from drain placement. Will continue to monitor.   - Advance to  clears after possible cardioversion today  - Continue IV Zosyn  - Continue pain management - Continue to follow primary team recommendations    -- Kojo Liby Barrientos PA-C

## 2024-06-23 NOTE — Progress Notes (Signed)
 PROGRESS NOTE    Yolanda Gray  FMW:[id number] DOB: September 22, 1955 DOA: 06/21/2024 PCP: Pcp, No  Chief Complaint  Patient presents with   Abdominal Pain    Hospital Course:  Yolanda Gray is a 68 year old female with obesity, COPD not on O2, active tobacco abuse, polycythemia, does not consistently see a physician, presented to the ED complaining of right lower quadrant abdominal pain which had been intermittent for 2 months prior to arrival.  Patient was found to be septic and in DKA with a new diabetes diagnosis hemoglobin A1c 10.7%.  She was also found to have acute sigmoid diverticulitis with an abscess 5.3 x 4 cm in the right hemipelvis.  She underwent IR drain placement on 11/11 and initiated on Zosyn. Patient was improving but late in the evening on 11/11 she went into A-fib RVR.  She was given oral metoprolol  and started on argatroban drip.  Echocardiogram was ordered.  She became hypotensive after metoprolol .  She was started on digoxin, blood pressure stabilized.  Subjective: Still A-fib RVR this morning.  She is anxious to begin eating.  Clear liquid diet was started.   Objective: Vitals:   06/22/24 2154 06/22/24 2233 06/22/24 2357 06/23/24 0338  BP: 99/67 114/78 124/74 110/70  Pulse:   (!) 128 (!) 109  Resp:  19 18 18   Temp:  98.1 F (36.7 C) 98.3 F (36.8 C) 98.4 F (36.9 C)  TempSrc:  Oral Oral   SpO2:  90% 94% 91%  Weight:      Height:        Intake/Output Summary (Last 24 hours) at 06/23/2024 9176 Last data filed at 06/23/2024 9375 Gross per 24 hour  Intake 1636.67 ml  Output 230 ml  Net 1406.67 ml   Filed Weights   06/21/24 1254  Weight: 100.6 kg    Examination: General exam: Appears calm and comfortable, NAD  Respiratory system: No work of breathing, symmetric chest wall expansion Cardiovascular system: S1 & S2 heard, rapid irregular rhythm Gastrointestinal system: Abdomen is nondistended, soft and nontender.  Abdominal drain in place, minimal fluid in  JP bulb. Neuro: Alert and oriented. No focal neurological deficits. Extremities: Symmetric, expected ROM  Assessment & Plan:  Principal Problem:   DKA (diabetic ketoacidosis) (HCC) Active Problems:   Obesity (BMI 30-39.9)   Abscess of sigmoid colon due to diverticulitis   Septic shock (HCC)   AKI (acute kidney injury)   T2DM (type 2 diabetes mellitus) (HCC)    A-fib RVR - Began 11/11, likely provoked by sepsis.  Became hypotensive with initial dose of metoprolol . - Started on argatroban drip, endorses severe allergy to pork. - Was initiated on digoxin, cardiology has discontinued and started on amiodarone now - Scheduled metoprolol  at this time, titrate as BP tolerates - Appreciate further recommendations from cardiology - Echocardiogram: Still pending  Sepsis, severe - On arrival tachycardic, hypotensive, elevated lactic, briefly required pressors. - Treatment for acute conditions as below  Acute sigmoid diverticulitis with abscess - 5.3 x 4 cm abscess in the right hemipelvis - 11/11 IR drain placement - Continue Zosyn - Follow-up blood cultures, negative to date - Initiated on clear liquid diet - General Surgery following. - On IV fluids - Trend CBC  DKA Type 2 diabetes - Diabetes diagnosis.  Hemoglobin A1c 10.7% - Does not see a primary care physician outpatient.  Glucose 400s on arrival - Continue with basal/bolus and sliding scale insulin for now.  Titrate daily. - Diabetes educator has been consulted for patient  education  AKI - Has resolved with IV fluids - Monitor kidney function closely  BMI 39 Obesity class II - Outpatient follow up for lifestyle modification and risk factor management   DVT prophylaxis: Currently on argatroban drip.   Code Status: Full Code Disposition: Pending clinical improvement.  Consultants:  Treatment Team:  Consulting Physician: Rodolph Romano, MD Consulting Physician: Ruthellen [facility],  MD Consulting Physician: Ammon Blunt, MD  Procedures:  11/11 IR drain placement  Antimicrobials:  Anti-infectives (From admission, onward)    Start     Dose/Rate Route Frequency Ordered Stop   06/22/24 2130  piperacillin-tazobactam (ZOSYN) IVPB 3.375 g        3.375 g 12.5 mL/hr over 240 Minutes Intravenous Every 8 hours 06/22/24 2100     06/21/24 1415  piperacillin-tazobactam (ZOSYN) IVPB 3.375 g  Status:  Discontinued        3.375 g 12.5 mL/hr over 240 Minutes Intravenous Every 8 hours 06/21/24 1400 06/22/24 2100       Data Reviewed: I have personally reviewed following labs and imaging studies CBC: Recent Labs  Lab 06/21/24 1017 06/22/24 0338 06/23/24 0137  WBC 9.1 10.1 14.3*  HGB 13.6 10.5* 11.2*  HCT 44.1 33.2* 35.6*  MCV 90.9 86.9 88.3  PLT 311 221 267   Basic Metabolic Panel: Recent Labs  Lab 06/21/24 1017 06/21/24 1220 06/21/24 1553 06/22/24 0338 06/23/24 0137  NA 133* 131* 135 132* 135  K 3.8 4.3 3.7 4.2 4.1  CL 92* 95* 99 98 98  CO2 16* 20* 20* 23 26  GLUCOSE 401* 395* 177* 170* 173*  BUN 15 16 15 22 17   CREATININE 1.34* 1.27* 1.09* 0.97 0.87  CALCIUM 8.7* 8.3* 7.9* 8.2* 8.5*   GFR: Estimated Creatinine Clearance: 72 mL/min (by C-G formula based on SCr of 0.87 mg/dL). Liver Function Tests: Recent Labs  Lab 06/21/24 1017 06/22/24 0338 06/22/24 2006  AST 40 23 24  ALT 19 16 17   ALKPHOS 85 53 81  BILITOT 1.1 0.9 0.5  PROT 9.1* 7.0 7.1  ALBUMIN 3.5 2.9* 3.3*   CBG: Recent Labs  Lab 06/22/24 1112 06/22/24 1724 06/22/24 2145 06/22/24 2352 06/23/24 0339  GLUCAP 159* 183* 172* 181* 150*    Recent Results (from the past 240 hours)  Resp panel by RT-PCR (RSV, Flu A&B, Covid) Anterior Nasal Swab     Status: None   Collection Time: 06/21/24 10:18 AM   Specimen: Anterior Nasal Swab  Result Value Ref Range Status   SARS Coronavirus 2 by RT PCR NEGATIVE NEGATIVE Final    Comment: (NOTE) SARS-CoV-2 target nucleic acids are NOT  DETECTED.  The SARS-CoV-2 RNA is generally detectable in upper respiratory specimens during the acute phase of infection. The lowest concentration of SARS-CoV-2 viral copies this assay can detect is 138 copies/mL. A negative result does not preclude SARS-Cov-2 infection and should not be used as the sole basis for treatment or other patient management decisions. A negative result may occur with  improper specimen collection/handling, submission of specimen other than nasopharyngeal swab, presence of viral mutation(s) within the areas targeted by this assay, and inadequate number of viral copies(<138 copies/mL). A negative result must be combined with clinical observations, patient history, and epidemiological information. The expected result is Negative.  Fact Sheet for Patients:  bloggercourse.com  Fact Sheet for Healthcare Providers:  seriousbroker.it  This test is no t yet approved or cleared by the [place]  FDA and  has been authorized for detection and/or diagnosis of SARS-CoV-2  by FDA under an Emergency Use Authorization (EUA). This EUA will remain  in effect (meaning this test can be used) for the duration of the COVID-19 declaration under Section 564(b)(1) of the Act, 21 U.S.C.section 360bbb-3(b)(1), unless the authorization is terminated  or revoked sooner.       Influenza A by PCR NEGATIVE NEGATIVE Final   Influenza B by PCR NEGATIVE NEGATIVE Final    Comment: (NOTE) The Xpert Xpress SARS-CoV-2/FLU/RSV plus assay is intended as an aid in the diagnosis of influenza from Nasopharyngeal swab specimens and should not be used as a sole basis for treatment. Nasal washings and aspirates are unacceptable for Xpert Xpress SARS-CoV-2/FLU/RSV testing.  Fact Sheet for Patients: bloggercourse.com  Fact Sheet for Healthcare Providers: seriousbroker.it  This test is not yet  approved or cleared by the [place]  FDA and has been authorized for detection and/or diagnosis of SARS-CoV-2 by FDA under an Emergency Use Authorization (EUA). This EUA will remain in effect (meaning this test can be used) for the duration of the COVID-19 declaration under Section 564(b)(1) of the Act, 21 U.S.C. section 360bbb-3(b)(1), unless the authorization is terminated or revoked.     Resp Syncytial Virus by PCR NEGATIVE NEGATIVE Final    Comment: (NOTE) Fact Sheet for Patients: bloggercourse.com  Fact Sheet for Healthcare Providers: seriousbroker.it  This test is not yet approved or cleared by the [place]  FDA and has been authorized for detection and/or diagnosis of SARS-CoV-2 by FDA under an Emergency Use Authorization (EUA). This EUA will remain in effect (meaning this test can be used) for the duration of the COVID-19 declaration under Section 564(b)(1) of the Act, 21 U.S.C. section 360bbb-3(b)(1), unless the authorization is terminated or revoked.  Performed at [facility], [address] [place], [place] [address]   Culture, blood (Routine X 2) w Reflex to ID Panel     Status: None (Preliminary result)   Collection Time: 06/21/24  2:00 PM   Specimen: BLOOD  Result Value Ref Range Status   Specimen Description BLOOD BLOOD RIGHT HAND  Final   Special Requests   Final    BOTTLES DRAWN AEROBIC AND ANAEROBIC Blood Culture results may not be optimal due to an inadequate volume of blood received in culture bottles   Culture   Final    NO GROWTH < 24 HOURS Performed at [facility], [address] [place], [place] [address]    Report Status PENDING  Incomplete  Culture, blood (Routine X 2) w Reflex to ID Panel     Status: None (Preliminary result)   Collection Time: 06/21/24  2:00 PM   Specimen: BLOOD  Result Value Ref Range Status   Specimen Description BLOOD RIGHT ANTECUBITAL  Final    Special Requests   Final    BOTTLES DRAWN AEROBIC AND ANAEROBIC Blood Culture results may not be optimal due to an inadequate volume of blood received in culture bottles   Culture   Final    NO GROWTH < 24 HOURS Performed at [facility], [address] [place], [place] [address]    Report Status PENDING  Incomplete  Aerobic/Anaerobic Culture w Gram Stain (surgical/deep wound)     Status: None (Preliminary result)   Collection Time: 06/22/24 12:52 PM   Specimen: Abdomen; Abscess  Result Value Ref Range Status   Specimen Description   Final    ABDOMEN Performed at [facility], [address], [place], [place] [address]    Special Requests  Final    NONE Performed at [facility], [address] [place], [place] [address]    Gram Stain   Final    RARE WBC PRESENT, PREDOMINANTLY PMN FEW GRAM POSITIVE COCCI FEW GRAM NEGATIVE RODS    Culture   Final    MODERATE GRAM NEGATIVE RODS CULTURE REINCUBATED FOR BETTER GROWTH Performed at [facility], [address] [place], [place] [address]    Report Status PENDING  Incomplete     Radiology Studies: CT GUIDED PERITONEAL/RETROPERITONEAL FLUID DRAIN BY PERC CATH Result Date: 06/22/2024 INDICATION: Right lower quadrant diverticular abscess EXAM: CT DRAINAGE OF THE RIGHT LOWER QUADRANT ABSCESS MEDICATIONS: The patient is currently admitted to the hospital and receiving intravenous antibiotics. The antibiotics were administered within an appropriate time frame prior to the initiation of the procedure. ANESTHESIA/SEDATION: Moderate (conscious) sedation was employed during this procedure. A total of Versed  2 mg and Fentanyl  50 mcg was administered intravenously by the radiology nurse. Total intra-service moderate Sedation Time: 12 minutes. The patient's level of consciousness and vital signs were monitored continuously by radiology nursing throughout the procedure under my direct supervision. COMPLICATIONS:  None immediate. PROCEDURE: Informed written consent was obtained from the patient after a thorough discussion of the procedural risks, benefits and alternatives. All questions were addressed. Maximal Sterile Barrier Technique was utilized including caps, mask, sterile gowns, sterile gloves, sterile drape, hand hygiene and skin antiseptic. A timeout was performed prior to the initiation of the procedure. Previous imaging reviewed. Patient positioned supine. Noncontrast localization CT performed. The right lower quadrant diverticular abscess was localized and marked. Under sterile conditions local anesthesia, the 18 gauge 15 cm access needle was advanced from a right lower quadrant anterior oblique approach into the abscess. Needle position confirmed with CT. Syringe aspiration yielded purulent fluid. Sample sent for culture. Guidewire inserted followed by tract dilatation to insert a 10 French drain. Drain catheter position confirmed with CT. Syringe five cc blood tinged purulent fluid. Catheter secured with a silk suture and sterile dressing. Suction bulb applied. No immediate complication. Patient tolerated the procedure well. IMPRESSION: Successful CT-guided right lower quadrant diverticular abscess drain placement. Electronically Signed   By: CHRISTELLA.  Shick M.D.   On: 06/22/2024 13:20   DG Chest Port 1 View Result Date: 06/21/2024 CLINICAL DATA:  Fever. EXAM: PORTABLE CHEST 1 VIEW COMPARISON:  Chest radiograph dated 09/22/2021. FINDINGS: There is cardiomegaly with central vascular congestion. No focal consolidation, pleural effusion, pneumothorax. No acute osseous pathology. IMPRESSION: Cardiomegaly with central vascular congestion. No focal consolidation. Electronically Signed   By: Vanetta Chou M.D.   On: 06/21/2024 13:28   CT ABDOMEN PELVIS W CONTRAST Result Date: 06/21/2024 CLINICAL DATA:  Right lower quadrant abdominal pain. EXAM: CT ABDOMEN AND PELVIS WITH CONTRAST TECHNIQUE: Multidetector CT imaging  of the abdomen and pelvis was performed using the standard protocol following bolus administration of intravenous contrast. RADIATION DOSE REDUCTION: This exam was performed according to the departmental dose-optimization program which includes automated exposure control, adjustment of the mA and/or kV according to patient size and/or use of iterative reconstruction technique. CONTRAST:  80mL OMNIPAQUE IOHEXOL 300 MG/ML  SOLN COMPARISON:  None Available. FINDINGS: Lower chest: No acute abnormality. No intra-abdominal free air.  Small free fluid in the pelvis. Hepatobiliary: Fatty appearing liver. No biliary ductal dilatation. The gallbladder is unremarkable. Pancreas: Unremarkable. No pancreatic ductal dilatation or surrounding inflammatory changes. Spleen: Normal in size without focal abnormality. Adrenals/Urinary Tract: The right adrenal glands unremarkable. There is a 1.5  cm indeterminate left adrenal nodule. There is no hydronephrosis on either side. The visualized ureters and urinary bladder appear unremarkable. Stomach/Bowel: There is severe sigmoid diverticulosis. There is active inflammatory changes consistent with acute diverticulitis. There is a 5.3 x 4.0 cm complex collection in the right hemipelvis medial to the right ovary containing fluid and air consistent with diverticular abscess. There is a linear tract extending from the distal sigmoid colon to a segment of the sigmoid colon more proximally (series 2 images 56-65) consistent with a fistula. Mildly dilated bowel loop in the left hemiabdomen measures 3.5 cm, likely a reactive ileus. Developing obstruction is less likely but not excluded. The appendix is not visualized with certainty. No inflammatory changes identified in the right lower quadrant. Vascular/Lymphatic: Moderate aortoiliac atherosclerotic disease. The IVC is unremarkable. No portal venous gas. There is no adenopathy. Reproductive: The uterus is anteverted. Uterine fibroids noted. No  suspicious adnexal masses. Other: None Musculoskeletal: Degenerative changes of the spine. No acute osseous pathology. IMPRESSION: 1. Acute sigmoid diverticulitis with a 5.3 x 4.0 cm diverticular abscess in the right hemipelvis. 2. Sigmoid-sigmoid fistula. 3. Mildly dilated bowel loop in the left hemiabdomen, likely a reactive ileus. Developing obstruction is less likely but not excluded. 4. Fatty liver. 5. Uterine fibroids. 6.  Aortic Atherosclerosis (ICD10-I70.0). Electronically Signed   By: Vanetta Chou M.D.   On: 06/21/2024 13:12    Scheduled Meds:  insulin aspart  0-6 Units Subcutaneous Q4H   insulin glargine-yfgn  10 Units Subcutaneous BID   metoprolol  tartrate  25 mg Oral Q6H   pantoprazole (PROTONIX) IV  40 mg Intravenous Q12H   sodium chloride  flush  5 mL Intracatheter Q8H   Continuous Infusions:  argatroban 0.67 mcg/kg/min (06/23/24 0817)   lactated ringers 125 mL/hr at 06/23/24 0322   piperacillin-tazobactam (ZOSYN)  IV 3.375 g (06/23/24 0620)     LOS: 2 days  MDM: Patient is high risk for one or more organ failure.  They necessitate ongoing hospitalization for continued IV therapies and subsequent lab monitoring. Total time spent interpreting labs and vitals, reviewing the medical record, coordinating care amongst consultants and care team members, directly assessing and discussing care with the patient and/or family: 55 min   Yolanda Echeverri, DO [facility]  To contact the attending physician between 7A-7P please use Epic Chat. To contact the covering physician during after hours 7P-7A, please review Amion.  06/23/2024, 8:23 AM   *This document has been created with the assistance of dictation software. Please excuse typographical errors. *

## 2024-06-23 NOTE — Consult Note (Signed)
 PHARMACY - ANTICOAGULATION CONSULT NOTE  Pharmacy Consult for Argatroban Indication: new onset atrial fibrillation  Allergies  Allergen Reactions   Porcine (Pork) Protein-Containing Drug Products     Patient Measurements: Height: 5' 3 (160 cm) Weight: 100.6 kg (221 lb 11.2 oz) IBW/kg (Calculated) : 52.4 HEPARIN DW (KG): 76  Vital Signs: Temp: 97.7 F (36.5 C) (11/12 1230) BP: 128/63 (11/12 1230) Pulse Rate: 70 (11/12 1014)  Labs: Recent Labs    06/21/24 1017 06/21/24 1220 06/21/24 1553 06/22/24 0338 06/22/24 2006 06/23/24 0137 06/23/24 0558 06/23/24 1204  HGB 13.6  --   --  10.5*  --  11.2*  --   --   HCT 44.1  --   --  33.2*  --  35.6*  --   --   PLT 311  --   --  221  --  267  --   --   APTT  --   --   --   --    < > 85* 110* 104*  CREATININE 1.34*   < > 1.09* 0.97  --  0.87  --   --    < > = values in this interval not displayed.    Estimated Creatinine Clearance: 72 mL/min (by C-G formula based on SCr of 0.87 mg/dL).   Medical History: Past Medical History:  Diagnosis Date   COPD (chronic obstructive pulmonary disease) (HCC)    Edentulous    Morbid obesity with BMI of 40.0-44.9, adult (HCC)    Obesity (BMI 30-39.9)    Polycythemia    Thrombocytopenia    Tobacco abuse    Tobacco abuse     Medications:  No history of chronic anticoagulant use PTA.  Assessment: 68 y.o. female with medical history significant for obesity, COPD not on home oxygen, ongoing active smoking, polycythemia who came into ED from home complaining of right lower quadrant abdominal pain intermittent for 2 months. Per note from attending, alerted by nurse to new tachycardia to 160s. Appears to be a-fib on tele and EKG confirms this. Per patient this is a new diagnosis. CHADSAVSAc 3 (MD, female, age). CBC remains stable. 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 Limited data with argatroban with afib.  Pharmacy consulted to initiate and dose argatroban infusion.    Goal of Therapy:  aPTT 50-90 seconds Monitor platelets by anticoagulation protocol: Yes  11/12 0137 aPTT 85, therapeutic x 1 11/12 0558 aPTT 110, supratherapeutic 11/12 1204 aPTT 104   Plan:  aPTT is supratherapeutic. Hold argatroban for 30 mins and restart at 0.47 mcg/kg/hr. Recheck aPTT in 4 hours. CBC daily while on argatroban.   Cathaleen Blanch, PharmD, 06/23/2024 12:42 PM

## 2024-06-24 ENCOUNTER — Inpatient Hospital Stay (HOSPITAL_COMMUNITY)
Admit: 2024-06-24 | Discharge: 2024-06-24 | Disposition: A | Discharge status: Home / Self Care | Attending: Student | Admitting: Student

## 2024-06-24 DIAGNOSIS — E669 Obesity, unspecified: Secondary | ICD-10-CM | POA: Diagnosis not present

## 2024-06-24 DIAGNOSIS — I4891 Unspecified atrial fibrillation: Secondary | ICD-10-CM

## 2024-06-24 DIAGNOSIS — E1110 Type 2 diabetes mellitus with ketoacidosis without coma: Secondary | ICD-10-CM | POA: Diagnosis not present

## 2024-06-24 DIAGNOSIS — N179 Acute kidney failure, unspecified: Secondary | ICD-10-CM | POA: Diagnosis not present

## 2024-06-24 DIAGNOSIS — K5720 Diverticulitis of large intestine with perforation and abscess without bleeding: Secondary | ICD-10-CM | POA: Diagnosis not present

## 2024-06-24 LAB — ECHOCARDIOGRAM COMPLETE
Height: 63 in
S' Lateral: 3.2 cm
Weight: 3547.2 [oz_av]

## 2024-06-24 LAB — CBC
HCT: 35.2 % — ABNORMAL LOW (ref 36.0–46.0)
Hemoglobin: 11 g/dL — ABNORMAL LOW (ref 12.0–15.0)
MCH: 27.8 pg (ref 26.0–34.0)
MCHC: 31.3 g/dL (ref 30.0–36.0)
MCV: 88.9 fL (ref 80.0–100.0)
Platelets: 270 K/uL (ref 150–400)
RBC: 3.96 MIL/uL (ref 3.87–5.11)
RDW: 13.9 % (ref 11.5–15.5)
WBC: 15.7 K/uL — ABNORMAL HIGH (ref 4.0–10.5)
nRBC: 0 % (ref 0.0–0.2)

## 2024-06-24 LAB — BASIC METABOLIC PANEL WITH GFR
Anion gap: 10 (ref 5–15)
BUN: 19 mg/dL (ref 8–23)
CO2: 27 mmol/L (ref 22–32)
Calcium: 8.5 mg/dL — ABNORMAL LOW (ref 8.9–10.3)
Chloride: 96 mmol/L — ABNORMAL LOW (ref 98–111)
Creatinine, Ser: 0.96 mg/dL (ref 0.44–1.00)
GFR, Estimated: >60 mL/min (ref >60)
Glucose, Bld: 154 mg/dL — ABNORMAL HIGH (ref 70–99)
Potassium: 4.2 mmol/L (ref 3.5–5.1)
Sodium: 133 mmol/L — ABNORMAL LOW (ref 135–145)

## 2024-06-24 LAB — GLUCOSE, CAPILLARY
Glucose-Capillary: 151 mg/dL — ABNORMAL HIGH (ref 70–99)
Glucose-Capillary: 155 mg/dL — ABNORMAL HIGH (ref 70–99)
Glucose-Capillary: 164 mg/dL — ABNORMAL HIGH (ref 70–99)
Glucose-Capillary: 204 mg/dL — ABNORMAL HIGH (ref 70–99)
Glucose-Capillary: 209 mg/dL — ABNORMAL HIGH (ref 70–99)
Glucose-Capillary: 263 mg/dL — ABNORMAL HIGH (ref 70–99)

## 2024-06-24 LAB — APTT: aPTT: 75 s — ABNORMAL HIGH (ref 24–36)

## 2024-06-24 LAB — MAGNESIUM: Magnesium: 2.1 mg/dL (ref 1.7–2.4)

## 2024-06-24 MED ORDER — PERFLUTREN LIPID MICROSPHERE
1.0000 mL | INTRAVENOUS | Status: AC | PRN
  Administered 2024-06-24: 3 mL via INTRAVENOUS

## 2024-06-24 MED ORDER — AMIODARONE IV BOLUS ONLY 150 MG/100ML
150.0000 mg | Freq: Once | INTRAVENOUS | Status: AC
  Administered 2024-06-24: 150 mg via INTRAVENOUS
  Filled 2024-06-24: qty 100

## 2024-06-24 MED ORDER — METOPROLOL TARTRATE 25 MG PO TABS
25.0000 mg | ORAL_TABLET | Freq: Two times a day (BID) | ORAL | Status: DC
  Administered 2024-06-24 (×2): 25 mg via ORAL
  Filled 2024-06-24 (×2): qty 1

## 2024-06-24 MED ORDER — MAGNESIUM SULFATE 2 GM/50ML IV SOLN
2.0000 g | Freq: Once | INTRAVENOUS | Status: AC
  Administered 2024-06-24: 2 g via INTRAVENOUS
  Filled 2024-06-24: qty 50

## 2024-06-24 MED ORDER — DIGOXIN 0.25 MG/ML IJ SOLN
0.1250 mg | Freq: Once | INTRAMUSCULAR | Status: AC
  Administered 2024-06-24: 0.125 mg via INTRAVENOUS
  Filled 2024-06-24: qty 2

## 2024-06-24 NOTE — Progress Notes (Signed)
 PROGRESS NOTE    Yolanda Gray  FMW:[id number] DOB: 1956-07-03 DOA: 06/21/2024 PCP: Pcp, No  Chief Complaint  Patient presents with   Abdominal Pain    Hospital Course:  Yolanda Gray is a 68 year old female with obesity, COPD not on O2, active tobacco abuse, polycythemia, does not consistently see a physician, presented to the ED complaining of right lower quadrant abdominal pain which had been intermittent for 2 months prior to arrival.  Patient was found to be septic and in DKA with a new diabetes diagnosis hemoglobin A1c 10.7%.  She was also found to have acute sigmoid diverticulitis with an abscess 5.3 x 4 cm in the right hemipelvis.  She underwent IR drain placement on 11/11 and initiated on Zosyn. Patient was improving but late in the evening on 11/11 she went into A-fib RVR.  She was given oral metoprolol  and started on argatroban drip.  Echocardiogram was ordered.  She became hypotensive after metoprolol .  She was started on digoxin, blood pressure stabilized.  Subjective: Still A-fib RVR this morning.  Cardiology continues to make medication changes. Patient reports no acute pain this morning.  Has had minimal drain output.  Her daughter is at bedside.  We discussed expected care plan. Patient is currently tolerating liquid diet without issue  Objective: Vitals:   06/23/24 1941 06/23/24 2322 06/24/24 0412 06/24/24 0820  BP: 90/67 119/74 (!) 146/107 (!) 148/73  Pulse: 94 (!) 119 72 (!) 124  Resp: 20 19 20    Temp: 97.7 F (36.5 C) 97.8 F (36.6 C) 98 F (36.7 C) 97.9 F (36.6 C)  TempSrc:  Oral Oral   SpO2: 91% 92% 90% 98%  Weight:      Height:        Intake/Output Summary (Last 24 hours) at 06/24/2024 9156 Last data filed at 06/24/2024 0500 Gross per 24 hour  Intake 867.43 ml  Output 15 ml  Net 852.43 ml   Filed Weights   06/21/24 1254  Weight: 100.6 kg    Examination: General exam: Appears calm and comfortable, NAD  Respiratory system: No work of breathing,  symmetric chest wall expansion Cardiovascular system: S1 & S2 heard, rapid irregular rhythm Gastrointestinal system: Abdomen is nondistended, soft and nontender.  Abdominal drain in place, minimal fluid in JP bulb. Neuro: Alert and oriented. No focal neurological deficits. Extremities: Symmetric, expected ROM  Assessment & Plan:  Principal Problem:   DKA (diabetic ketoacidosis) (HCC) Active Problems:   Obesity (BMI 30-39.9)   Abscess of sigmoid colon due to diverticulitis   Septic shock (HCC)   AKI (acute kidney injury)   T2DM (type 2 diabetes mellitus) (HCC)    A-fib RVR - Began 11/11, likely provoked by sepsis. - Started on argatroban drip, endorses severe allergy to pork. - Cardiology consulted.  Ongoing medication changes.  Increase metoprolol  today - Additional amiodarone bolus this morning. - Continue to correct electrolytes.  Mag stable above 2, potassium 4.2. - Echo: LVEF 60 to 65%, no RWMA, diastolic parameters indeterminate.  No significant valvular dysfunction noted.  Sepsis, severe - On arrival tachycardic, hypotensive, elevated lactic, briefly required pressors. - Treatment for acute conditions as below  Acute sigmoid diverticulitis with abscess - 5.3 x 4 cm abscess in the right hemipelvis - 11/11 IR drain placement.  Drain output has been very minimal. - Continue Zosyn for now.  Culture still pending. -- WBC continues to be elevated which may be reactive complicated by RVR  - Follow-up blood cultures which are negative to  date. - Continue to advance diet slowly.  Currently tolerating liquid diet - General Surgery following. - Trend CBC  DKA Type 2 diabetes - Diabetes diagnosis.  Hemoglobin A1c 10.7% - Does not see a primary care physician outpatient.  Glucose 400s on arrival - Continue with basal/bolus and sliding scale insulin for now.  Titrate daily. - Diabetes educator has been consulted for patient education  AKI - Has resolved with IV fluids -  Monitor kidney function closely  BMI 39 Obesity class II - Outpatient follow up for lifestyle modification and risk factor management   DVT prophylaxis: Currently on argatroban drip.   Code Status: Full Code Disposition: Pending clinical improvement.  Consultants:  Treatment Team:  Consulting Physician: Rodolph Romano, MD Consulting Physician: Ruthellen Ruthellen Radiology, MD Consulting Physician: Ammon Blunt, MD  Procedures:  11/11 IR drain placement  Antimicrobials:  Anti-infectives (From admission, onward)    Start     Dose/Rate Route Frequency Ordered Stop   06/22/24 2130  piperacillin-tazobactam (ZOSYN) IVPB 3.375 g        3.375 g 12.5 mL/hr over 240 Minutes Intravenous Every 8 hours 06/22/24 2100     06/21/24 1415  piperacillin-tazobactam (ZOSYN) IVPB 3.375 g  Status:  Discontinued        3.375 g 12.5 mL/hr over 240 Minutes Intravenous Every 8 hours 06/21/24 1400 06/22/24 2100       Data Reviewed: I have personally reviewed following labs and imaging studies CBC: Recent Labs  Lab 06/21/24 1017 06/22/24 0338 06/23/24 0137 06/24/24 0450  WBC 9.1 10.1 14.3* 15.7*  HGB 13.6 10.5* 11.2* 11.0*  HCT 44.1 33.2* 35.6* 35.2*  MCV 90.9 86.9 88.3 88.9  PLT 311 221 267 270   Basic Metabolic Panel: Recent Labs  Lab 06/21/24 1220 06/21/24 1553 06/22/24 0338 06/23/24 0137 06/24/24 0446 06/24/24 0450  NA 131* 135 132* 135  --  133*  K 4.3 3.7 4.2 4.1  --  4.2  CL 95* 99 98 98  --  96*  CO2 20* 20* 23 26  --  27  GLUCOSE 395* 177* 170* 173*  --  154*  BUN 16 15 22 17   --  19  CREATININE 1.27* 1.09* 0.97 0.87  --  0.96  CALCIUM 8.3* 7.9* 8.2* 8.5*  --  8.5*  MG  --   --   --   --  2.1  --    GFR: Estimated Creatinine Clearance: 65.2 mL/min (by C-G formula based on SCr of 0.96 mg/dL). Liver Function Tests: Recent Labs  Lab 06/21/24 1017 06/22/24 0338 06/22/24 2006  AST 40 23 24  ALT 19 16 17   ALKPHOS 85 53 81  BILITOT 1.1 0.9 0.5   PROT 9.1* 7.0 7.1  ALBUMIN 3.5 2.9* 3.3*   CBG: Recent Labs  Lab 06/23/24 1619 06/23/24 1950 06/23/24 2319 06/24/24 0415 06/24/24 0816  GLUCAP 153* 165* 150* 151* 155*    Recent Results (from the past 240 hours)  Resp panel by RT-PCR (RSV, Flu A&B, Covid) Anterior Nasal Swab     Status: None   Collection Time: 06/21/24 10:18 AM   Specimen: Anterior Nasal Swab  Result Value Ref Range Status   SARS Coronavirus 2 by RT PCR NEGATIVE NEGATIVE Final    Comment: (NOTE) SARS-CoV-2 target nucleic acids are NOT DETECTED.  The SARS-CoV-2 RNA is generally detectable in upper respiratory specimens during the acute phase of infection. The lowest concentration of SARS-CoV-2 viral copies this assay can detect is 138 copies/mL. A negative  result does not preclude SARS-Cov-2 infection and should not be used as the sole basis for treatment or other patient management decisions. A negative result may occur with  improper specimen collection/handling, submission of specimen other than nasopharyngeal swab, presence of viral mutation(s) within the areas targeted by this assay, and inadequate number of viral copies(<138 copies/mL). A negative result must be combined with clinical observations, patient history, and epidemiological information. The expected result is Negative.  Fact Sheet for Patients:  bloggercourse.com  Fact Sheet for Healthcare Providers:  seriousbroker.it  This test is no t yet approved or cleared by the [place]  FDA and  has been authorized for detection and/or diagnosis of SARS-CoV-2 by FDA under an Emergency Use Authorization (EUA). This EUA will remain  in effect (meaning this test can be used) for the duration of the COVID-19 declaration under Section 564(b)(1) of the Act, 21 U.S.C.section 360bbb-3(b)(1), unless the authorization is terminated  or revoked sooner.       Influenza A by PCR NEGATIVE NEGATIVE  Final   Influenza B by PCR NEGATIVE NEGATIVE Final    Comment: (NOTE) The Xpert Xpress SARS-CoV-2/FLU/RSV plus assay is intended as an aid in the diagnosis of influenza from Nasopharyngeal swab specimens and should not be used as a sole basis for treatment. Nasal washings and aspirates are unacceptable for Xpert Xpress SARS-CoV-2/FLU/RSV testing.  Fact Sheet for Patients: bloggercourse.com  Fact Sheet for Healthcare Providers: seriousbroker.it  This test is not yet approved or cleared by the [place]  FDA and has been authorized for detection and/or diagnosis of SARS-CoV-2 by FDA under an Emergency Use Authorization (EUA). This EUA will remain in effect (meaning this test can be used) for the duration of the COVID-19 declaration under Section 564(b)(1) of the Act, 21 U.S.C. section 360bbb-3(b)(1), unless the authorization is terminated or revoked.     Resp Syncytial Virus by PCR NEGATIVE NEGATIVE Final    Comment: (NOTE) Fact Sheet for Patients: bloggercourse.com  Fact Sheet for Healthcare Providers: seriousbroker.it  This test is not yet approved or cleared by the [place]  FDA and has been authorized for detection and/or diagnosis of SARS-CoV-2 by FDA under an Emergency Use Authorization (EUA). This EUA will remain in effect (meaning this test can be used) for the duration of the COVID-19 declaration under Section 564(b)(1) of the Act, 21 U.S.C. section 360bbb-3(b)(1), unless the authorization is terminated or revoked.  Performed at [facility], [address] [place], [place] [address]   Culture, blood (Routine X 2) w Reflex to ID Panel     Status: None (Preliminary result)   Collection Time: 06/21/24  2:00 PM   Specimen: BLOOD  Result Value Ref Range Status   Specimen Description BLOOD BLOOD RIGHT HAND  Final   Special Requests   Final    BOTTLES  DRAWN AEROBIC AND ANAEROBIC Blood Culture results may not be optimal due to an inadequate volume of blood received in culture bottles   Culture   Final    NO GROWTH 3 DAYS Performed at [facility] : [facility], [address], [place], [place] [address]    Report Status PENDING  Incomplete  Culture, blood (Routine X 2) w Reflex to ID Panel     Status: None (Preliminary result)   Collection Time: 06/21/24  2:00 PM   Specimen: BLOOD  Result Value Ref Range Status   Specimen Description BLOOD RIGHT ANTECUBITAL  Final   Special Requests   Final    BOTTLES DRAWN AEROBIC AND ANAEROBIC  Blood Culture results may not be optimal due to an inadequate volume of blood received in culture bottles   Culture   Final    NO GROWTH 3 DAYS Performed at [facility], [address] [place], [place] [address]    Report Status PENDING  Incomplete  Aerobic/Anaerobic Culture w Gram Stain (surgical/deep wound)     Status: None (Preliminary result)   Collection Time: 06/22/24 12:52 PM   Specimen: Abdomen; Abscess  Result Value Ref Range Status   Specimen Description   Final    ABDOMEN Performed at [facility], [address], [place], [place] [address]    Special Requests   Final    NONE Performed at [facility], [address] [place], [place] [address]    Gram Stain   Final    RARE WBC PRESENT, PREDOMINANTLY PMN FEW GRAM POSITIVE COCCI FEW GRAM NEGATIVE RODS    Culture   Final    MODERATE GRAM NEGATIVE RODS CULTURE REINCUBATED FOR BETTER GROWTH Performed at [facility], [address], [place], [place] [address]    Report Status PENDING  Incomplete     Radiology Studies: CT GUIDED PERITONEAL/RETROPERITONEAL FLUID DRAIN BY PERC CATH Result Date: 06/22/2024 INDICATION: Right lower quadrant diverticular abscess EXAM: CT DRAINAGE OF THE RIGHT LOWER QUADRANT ABSCESS MEDICATIONS: The patient is currently admitted to the hospital and receiving intravenous  antibiotics. The antibiotics were administered within an appropriate time frame prior to the initiation of the procedure. ANESTHESIA/SEDATION: Moderate (conscious) sedation was employed during this procedure. A total of Versed  2 mg and Fentanyl  50 mcg was administered intravenously by the radiology nurse. Total intra-service moderate Sedation Time: 12 minutes. The patient's level of consciousness and vital signs were monitored continuously by radiology nursing throughout the procedure under my direct supervision. COMPLICATIONS: None immediate. PROCEDURE: Informed written consent was obtained from the patient after a thorough discussion of the procedural risks, benefits and alternatives. All questions were addressed. Maximal Sterile Barrier Technique was utilized including caps, mask, sterile gowns, sterile gloves, sterile drape, hand hygiene and skin antiseptic. A timeout was performed prior to the initiation of the procedure. Previous imaging reviewed. Patient positioned supine. Noncontrast localization CT performed. The right lower quadrant diverticular abscess was localized and marked. Under sterile conditions local anesthesia, the 18 gauge 15 cm access needle was advanced from a right lower quadrant anterior oblique approach into the abscess. Needle position confirmed with CT. Syringe aspiration yielded purulent fluid. Sample sent for culture. Guidewire inserted followed by tract dilatation to insert a 10 French drain. Drain catheter position confirmed with CT. Syringe five cc blood tinged purulent fluid. Catheter secured with a silk suture and sterile dressing. Suction bulb applied. No immediate complication. Patient tolerated the procedure well. IMPRESSION: Successful CT-guided right lower quadrant diverticular abscess drain placement. Electronically Signed   By: CHRISTELLA.  Shick M.D.   On: 06/22/2024 13:20    Scheduled Meds:  insulin aspart  0-6 Units Subcutaneous Q4H   insulin glargine-yfgn  10 Units  Subcutaneous BID   metoprolol  tartrate  25 mg Oral BID   pantoprazole (PROTONIX) IV  40 mg Intravenous Q12H   sodium chloride  flush  5 mL Intracatheter Q8H   Continuous Infusions:  amiodarone 30 mg/hr (06/24/24 0009)   argatroban 0.37 mcg/kg/min (06/23/24 1944)   piperacillin-tazobactam (ZOSYN)  IV 3.375 g (06/24/24 0457)     LOS: 3 days  MDM: Patient is high risk for one or more organ failure.  They necessitate ongoing  hospitalization for continued IV therapies and subsequent lab monitoring. Total time spent interpreting labs and vitals, reviewing the medical record, coordinating care amongst consultants and care team members, directly assessing and discussing care with the patient and/or family: 55 min   Dajon Lazar, DO [facility]  To contact the attending physician between 7A-7P please use Epic Chat. To contact the covering physician during after hours 7P-7A, please review Amion.  06/24/2024, 8:43 AM   *This document has been created with the assistance of dictation software. Please excuse typographical errors. *

## 2024-06-24 NOTE — Progress Notes (Signed)
 [facility]- General Surgery  SURGICAL PROGRESS NOTE  Hospital Day(s): 3.   Interval History:  Patient is status post day 2 of drain placement.  Reports doing much better this morning. Pain is better controlled. Had tolerated clear liquids. Denies any nausea or vomiting.  Still on IV argatroban, rate and rhythm control for A-fib. She also had an echo this morning.    Vital signs in last 24 hours: [min-max] current  Temp:  [97.7 F (36.5 C)-98.3 F (36.8 C)] 97.9 F (36.6 C) (11/13 0820) Pulse Rate:  [70-124] 124 (11/13 0820) Resp:  [18-20] 20 (11/13 0412) BP: (90-148)/(63-107) 148/73 (11/13 0820) SpO2:  [90 %-98 %] 98 % (11/13 0820)     Height: 5' 3 (160 cm) Weight: 100.6 kg BMI (Calculated): 39.28   Intake/Output last 2 shifts:  11/12 0701 - 11/13 0700 In: 867.4 [P.O.:240; I.V.:518.1; IV Piggyback:99.3] Out: 15 [Drains:15]   Physical Exam:  Constitutional: alert, cooperative and no distress  Respiratory: breathing non-labored at rest  Cardiovascular: tachycardic and sinus rhythm  Gastrointestinal: soft, non-tender, and non-distended, minimal clear output in JP drain   Labs:     Latest Ref Rng & Units 06/24/2024    4:50 AM 06/23/2024    1:37 AM 06/22/2024    3:38 AM  CBC  WBC 4.0 - 10.5 K/uL 15.7  14.3  10.1   Hemoglobin 12.0 - 15.0 g/dL 88.9  88.7  89.4   Hematocrit 36.0 - 46.0 % 35.2  35.6  33.2   Platelets 150 - 400 K/uL 270  267  221       Latest Ref Rng & Units 06/24/2024    4:50 AM 06/23/2024    1:37 AM 06/22/2024    8:06 PM  CMP  Glucose 70 - 99 mg/dL 845  826    BUN 8 - 23 mg/dL 19  17    Creatinine 9.55 - 1.00 mg/dL 9.03  9.12    Sodium 864 - 145 mmol/L 133  135    Potassium 3.5 - 5.1 mmol/L 4.2  4.1    Chloride 98 - 111 mmol/L 96  98    CO2 22 - 32 mmol/L 27  26    Calcium 8.9 - 10.3 mg/dL 8.5  8.5    Total Protein 6.5 - 8.1 g/dL   7.1   Total Bilirubin 0.0 - 1.2 mg/dL   0.5   Alkaline Phos 38 - 126 U/L   81   AST 15 - 41 U/L   24   ALT 0  - 44 U/L   17     Imaging studies: No new pertinent imaging studies   Assessment/Plan:  68 y.o. 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    - S/p day 2 percutaneous drainage  - No abdominal pain on exam. Patient appears comfortable and clinically improving.Will advance to full liquid diet.  - Output from drain has remained minimal, culture report still pending.  - Continue IV Zosyn and pain management  - Continue to follow cardiology recommendations for new onset A-fib  -- Edithe Dobbin Barrientos PA-C

## 2024-06-24 NOTE — Inpatient Diabetes Management (Signed)
 Inpatient Diabetes Program Recommendations  AACE/ADA: New Consensus Statement on Inpatient Glycemic Control   Target Ranges:  Prepandial:   less than 140 mg/dL      Peak postprandial:   less than 180 mg/dL (1-2 hours)      Critically ill patients:  140 - 180 mg/dL    Latest Reference Range & Units 06/23/24 08:13 06/23/24 12:22 06/23/24 16:19 06/23/24 19:50 06/23/24 23:19 06/24/24 04:15 06/24/24 08:16 06/24/24 12:13  Glucose-Capillary 70 - 99 mg/dL 855 (H) 827 (H) 846 (H) 165 (H) 150 (H) 151 (H) 155 (H) 263 (H)   Review of Glycemic Control  Diabetes history: No Outpatient Diabetes medications: NA Current orders for Inpatient glycemic control: Semglee 10 units BID, Novolog 0-6 units Q4H   Inpatient Diabetes Program Recommendations:     HbgA1C: A1C 10.7% on 06/21/24 indicating an average glucose of 260 mg/dl over the past 2-3 months. Being newly dx with DM this admission.   Outpatient DM: At discharge please provide Rx for glucose monitoring kit (#[id number]) and for FreeStyle Libre 3 Plus sensors (#[id number]) and reader [phone]). If patient is discharged on insulin, insurance covers Lantus pens [phone]) and Novolog pens [phone]); would also need insulin pen needles [phone]).    Discharge Recommendations: Other recommendations: FreeStyle Libre 3 Plus reader [phone]) and FreeStyle Libre 3 Plus sensors [phone]) Long acting recommendations: Insulin Glargine (LANTUS) Solostar Pen Dose to be determined  Short acting recommendations:  Correction coverage ONLY Insulin aspart (NOVOLOG) FlexPen  Sensitive Scale.   Hypoglycemia treatment recommendations: Baqsimi 3mg  Supply/Referral recommendations: Glucometer Test strips Lancet device Lancets Pen needles - standard   Use Adult Diabetes Insulin Treatment Post Discharge order set.  NOTE: Spoke with patient and husband at bedside. Informed patient that insurance covers FreeStyle Libre 3 Plus sensors for copay of $13.11, Lantus insulin pens  for copay of $35, and Novolog insulin pens for copay of $35. Discussed how to use the FreeStyle Libre 3 Plus sensor and provided 2 sensor samples for patient to start once she is discharged and is able to get the [employer] 3 Plus reader. Patient's husband states he can help patient if needed as he applies his FreeStyle [place] 2 sensor the same way. Discussed Lantus and Novolog insulin and how each insulin works in case prescribed at discharge. Encouraged patient to get established with a PCP and to follow up consistently so she can continue to get needed medications and supplies and also to have provider assist with DM management. Asked patient to be sure to reach out to PCP if she has any issues at all with hypoglycemia as DM medications may need to be adjusted especially if she makes a lot of lifestyle modifications after discharge.  Patient verbalized understanding and appreciative of visit and has no questions at this time.  Thanks, Earnie Gainer, RN, MSN, CDCES Diabetes Coordinator Inpatient Diabetes Program [phone] (Team Pager from 8am to 5pm)

## 2024-06-24 NOTE — Progress Notes (Addendum)
 [facility] CLINIC CARDIOLOGY PROGRESS NOTE       Patient ID: Yolanda Gray MRN: [id number] DOB/AGE: 1955-12-17 68 y.o.  Admit date: 06/21/2024 Referring Physician Dr. Delayne Solian Primary Physician Pcp, No  Primary Cardiologist None Reason for Consultation atrial fibrillation RVR  HPI: Yolanda Gray is a 68 y.o. female  with a past medical history of COPD, tobacco use, polycythemia, obesity who presented to the ED on 06/21/2024 for abdominal pain.  Found to be septic with acute diverticulitis with abscess.  Underwent drain placement yesterday.  Developed atrial fibrillation RVR on the evening of 06/22/2024.  Cardiology was consulted for further evaluation.   Interval history: - Patient seen and examined this morning resting comfortably in hospital bed. - States her abdominal pain is overall improved today. - Heart rate is elevated on telemetry this morning.  BP stable.  Review of systems complete and found to be negative unless listed above    Past Medical History:  Diagnosis Date   COPD (chronic obstructive pulmonary disease) (HCC)    Edentulous    Morbid obesity with BMI of 40.0-44.9, adult (HCC)    Obesity (BMI 30-39.9)    Polycythemia    Thrombocytopenia    Tobacco abuse    Tobacco abuse     Past Surgical History:  Procedure Laterality Date   CATARACT EXTRACTION W/PHACO Left 10/09/2023   Procedure: CATARACT EXTRACTION PHACO AND INTRAOCULAR LENS PLACEMENT (IOC) LEFT MALYUGIN OMIDRIA  13.44 01:09.5;  Surgeon: Enola Feliciano Hugger, MD;  Location: [facility];  Service: Ophthalmology;  Laterality: Left;   CATARACT EXTRACTION W/PHACO Right 10/30/2023   Procedure: PHACOEMULSIFICATION, CATARACT, WITH IOL INSERTION 6.65, 00:53.8;  Surgeon: Enola Feliciano Hugger, MD;  Location: [facility];  Service: Ophthalmology;  Laterality: Right;   CESAREAN SECTION     TONSILLECTOMY      No medications prior to admission.   Social History   Socioeconomic History   Marital  status: Married    Spouse name: Not on file   Number of children: Not on file   Years of education: Not on file   Highest education level: Not on file  Occupational History   Not on file  Tobacco Use   Smoking status: Former    Current packs/day: 0.00    Types: Cigarettes    Quit date: 2022    Years since quitting: 3.8   Smokeless tobacco: Never  Vaping Use   Vaping status: Never Used  Substance and Sexual Activity   Alcohol use: Not Currently   Drug use: Never   Sexual activity: Not on file  Other Topics Concern   Not on file  Social History Narrative   Not on file   Social Drivers of Health   Financial Resource Strain: Not on file  Food Insecurity: No Food Insecurity (06/22/2024)   Hunger Vital Sign    Worried About Running Out of Food in the Last Year: Never true    Ran Out of Food in the Last Year: Never true  Transportation Needs: No Transportation Needs (06/22/2024)   PRAPARE - Administrator, Civil Service (Medical): No    Lack of Transportation (Non-Medical): No  Physical Activity: Not on file  Stress: Not on file  Social Connections: Unknown (06/22/2024)   Social Connection and Isolation Panel    Frequency of Communication with Friends and Family: More than three times a week    Frequency of Social Gatherings with Friends and Family: More than three times a week  Attends Religious Services: More than 4 times per year    Active Member of Clubs or Organizations: Patient declined    Attends Banker Meetings: Patient declined    Marital Status: Not on file  Intimate Partner Violence: Not At Risk (06/22/2024)   Humiliation, Afraid, Rape, and Kick questionnaire    Fear of Current or Ex-Partner: No    Emotionally Abused: No    Physically Abused: No    Sexually Abused: No    Family History  Problem Relation Age of Onset   Asthma Mother    Heart attack Mother    Heart attack Father      Vitals:   06/23/24 1941 06/23/24 2322  06/24/24 0412 06/24/24 0820  BP: 90/67 119/74 (!) 146/107 (!) 148/73  Pulse: 94 (!) 119 72 (!) 124  Resp: 20 19 20    Temp: 97.7 F (36.5 C) 97.8 F (36.6 C) 98 F (36.7 C) 97.9 F (36.6 C)  TempSrc:  Oral Oral   SpO2: 91% 92% 90% 98%  Weight:      Height:        PHYSICAL EXAM General: Chronically ill-appearing female, well nourished, in no acute distress. HEENT: Normocephalic and atraumatic. Neck: No JVD.  Lungs: Normal respiratory effort on room air. Clear bilaterally to auscultation. No wheezes, crackles, rhonchi.  Heart: Irregularly irregular, borderline right. Normal S1 and S2 without gallops or murmurs.  Abdomen: Non-distended appearing.  Msk: Normal strength and tone for age. Extremities: Warm and well perfused. No clubbing, cyanosis.  No edema.  Neuro: Alert and oriented X 3. Psych: Answers questions appropriately.   Labs: Basic Metabolic Panel: Recent Labs    06/23/24 0137 06/24/24 0446 06/24/24 0450  NA 135  --  133*  K 4.1  --  4.2  CL 98  --  96*  CO2 26  --  27  GLUCOSE 173*  --  154*  BUN 17  --  19  CREATININE 0.87  --  0.96  CALCIUM 8.5*  --  8.5*  MG  --  2.1  --    Liver Function Tests: Recent Labs    06/22/24 0338 06/22/24 2006  AST 23 24  ALT 16 17  ALKPHOS 53 81  BILITOT 0.9 0.5  PROT 7.0 7.1  ALBUMIN 2.9* 3.3*   Recent Labs    06/21/24 1017  LIPASE 21   CBC: Recent Labs    06/23/24 0137 06/24/24 0450  WBC 14.3* 15.7*  HGB 11.2* 11.0*  HCT 35.6* 35.2*  MCV 88.3 88.9  PLT 267 270   Cardiac Enzymes: No results for input(s): CKTOTAL, CKMB, CKMBINDEX, TROPONINIHS in the last 72 hours. BNP: No results for input(s): BNP in the last 72 hours. D-Dimer: No results for input(s): DDIMER in the last 72 hours. Hemoglobin A1C: Recent Labs    06/21/24 1017  HGBA1C 10.7*   Fasting Lipid Panel: No results for input(s): CHOL, HDL, LDLCALC, TRIG, CHOLHDL, LDLDIRECT in the last 72 hours. Thyroid Function  Tests: Recent Labs    06/22/24 1810  TSH 2.230   Anemia Panel: No results for input(s): VITAMINB12, FOLATE, FERRITIN, TIBC, IRON, RETICCTPCT in the last 72 hours.   Radiology: CT GUIDED PERITONEAL/RETROPERITONEAL FLUID DRAIN BY PERC CATH Result Date: 06/22/2024 INDICATION: Right lower quadrant diverticular abscess EXAM: CT DRAINAGE OF THE RIGHT LOWER QUADRANT ABSCESS MEDICATIONS: The patient is currently admitted to the hospital and receiving intravenous antibiotics. The antibiotics were administered within an appropriate time frame prior to the initiation of the  procedure. ANESTHESIA/SEDATION: Moderate (conscious) sedation was employed during this procedure. A total of Versed  2 mg and Fentanyl  50 mcg was administered intravenously by the radiology nurse. Total intra-service moderate Sedation Time: 12 minutes. The patient's level of consciousness and vital signs were monitored continuously by radiology nursing throughout the procedure under my direct supervision. COMPLICATIONS: None immediate. PROCEDURE: Informed written consent was obtained from the patient after a thorough discussion of the procedural risks, benefits and alternatives. All questions were addressed. Maximal Sterile Barrier Technique was utilized including caps, mask, sterile gowns, sterile gloves, sterile drape, hand hygiene and skin antiseptic. A timeout was performed prior to the initiation of the procedure. Previous imaging reviewed. Patient positioned supine. Noncontrast localization CT performed. The right lower quadrant diverticular abscess was localized and marked. Under sterile conditions local anesthesia, the 18 gauge 15 cm access needle was advanced from a right lower quadrant anterior oblique approach into the abscess. Needle position confirmed with CT. Syringe aspiration yielded purulent fluid. Sample sent for culture. Guidewire inserted followed by tract dilatation to insert a 10 French drain. Drain catheter  position confirmed with CT. Syringe five cc blood tinged purulent fluid. Catheter secured with a silk suture and sterile dressing. Suction bulb applied. No immediate complication. Patient tolerated the procedure well. IMPRESSION: Successful CT-guided right lower quadrant diverticular abscess drain placement. Electronically Signed   By: CHRISTELLA.  Shick M.D.   On: 06/22/2024 13:20   DG Chest Port 1 View Result Date: 06/21/2024 CLINICAL DATA:  Fever. EXAM: PORTABLE CHEST 1 VIEW COMPARISON:  Chest radiograph dated 09/22/2021. FINDINGS: There is cardiomegaly with central vascular congestion. No focal consolidation, pleural effusion, pneumothorax. No acute osseous pathology. IMPRESSION: Cardiomegaly with central vascular congestion. No focal consolidation. Electronically Signed   By: Vanetta Chou M.D.   On: 06/21/2024 13:28   CT ABDOMEN PELVIS W CONTRAST Result Date: 06/21/2024 CLINICAL DATA:  Right lower quadrant abdominal pain. EXAM: CT ABDOMEN AND PELVIS WITH CONTRAST TECHNIQUE: Multidetector CT imaging of the abdomen and pelvis was performed using the standard protocol following bolus administration of intravenous contrast. RADIATION DOSE REDUCTION: This exam was performed according to the departmental dose-optimization program which includes automated exposure control, adjustment of the mA and/or kV according to patient size and/or use of iterative reconstruction technique. CONTRAST:  80mL OMNIPAQUE IOHEXOL 300 MG/ML  SOLN COMPARISON:  None Available. FINDINGS: Lower chest: No acute abnormality. No intra-abdominal free air.  Small free fluid in the pelvis. Hepatobiliary: Fatty appearing liver. No biliary ductal dilatation. The gallbladder is unremarkable. Pancreas: Unremarkable. No pancreatic ductal dilatation or surrounding inflammatory changes. Spleen: Normal in size without focal abnormality. Adrenals/Urinary Tract: The right adrenal glands unremarkable. There is a 1.5 cm indeterminate left adrenal nodule.  There is no hydronephrosis on either side. The visualized ureters and urinary bladder appear unremarkable. Stomach/Bowel: There is severe sigmoid diverticulosis. There is active inflammatory changes consistent with acute diverticulitis. There is a 5.3 x 4.0 cm complex collection in the right hemipelvis medial to the right ovary containing fluid and air consistent with diverticular abscess. There is a linear tract extending from the distal sigmoid colon to a segment of the sigmoid colon more proximally (series 2 images 56-65) consistent with a fistula. Mildly dilated bowel loop in the left hemiabdomen measures 3.5 cm, likely a reactive ileus. Developing obstruction is less likely but not excluded. The appendix is not visualized with certainty. No inflammatory changes identified in the right lower quadrant. Vascular/Lymphatic: Moderate aortoiliac atherosclerotic disease. The IVC is unremarkable. No portal venous gas.  There is no adenopathy. Reproductive: The uterus is anteverted. Uterine fibroids noted. No suspicious adnexal masses. Other: None Musculoskeletal: Degenerative changes of the spine. No acute osseous pathology. IMPRESSION: 1. Acute sigmoid diverticulitis with a 5.3 x 4.0 cm diverticular abscess in the right hemipelvis. 2. Sigmoid-sigmoid fistula. 3. Mildly dilated bowel loop in the left hemiabdomen, likely a reactive ileus. Developing obstruction is less likely but not excluded. 4. Fatty liver. 5. Uterine fibroids. 6.  Aortic Atherosclerosis (ICD10-I70.0). Electronically Signed   By: Vanetta Chou M.D.   On: 06/21/2024 13:12    ECHO pending  TELEMETRY (personally reviewed): Atrial fibrillation rate 120s  EKG (personally reviewed): No EKG for review  Data reviewed by me 06/24/2024: last 24h vitals tele labs imaging I/O ED provider note, admission H&P, hospitalist progress note, general surgery notes  Principal Problem:   DKA (diabetic ketoacidosis) (HCC) Active Problems:   Obesity (BMI  30-39.9)   Abscess of sigmoid colon due to diverticulitis   Septic shock (HCC)   AKI (acute kidney injury)   T2DM (type 2 diabetes mellitus) (HCC)    ASSESSMENT AND PLAN:  Yolanda Gray is a 68 y.o. female  with a past medical history of COPD, tobacco use, polycythemia, obesity who presented to the ED on 06/21/2024 for abdominal pain.  Found to be septic with acute diverticulitis with abscess.  Underwent drain placement yesterday.  Developed atrial fibrillation RVR on the evening of 06/22/2024.  Cardiology was consulted for further evaluation.   # Atrial fibrillation RVR # New onset atrial fibrillation # Acute diverticulitis # Sepsis Patient initially presented with abdominal pain found to have acute diverticulitis and was septic.  Has been treated with antibiotics.  On the evening of 06/22/2024 she developed atrial fibrillation RVR.  Initially treated with metoprolol  however became hypotensive, then started on IV digoxin.  Started on IV argatroban for anticoagulation as she has allergy to porcine products. - Echo pending.  Further recommendations pending these results. - Continue IV argatroban for anticoagulation.  Plan to transition to DOAC tomorrow. Will ultimately plan to transition to Eliquis for anticoagulation but will wait as she just had drain placed yesterday and is continuing to have abdominal issues. - Rebolus IV amiodarone. Continue infusion. - Increase metoprolol  to 25 mg BID.   This patient's plan of care was discussed and created with Dr. Ammon and he is in agreement.  Signed: Danita Bloch, PA-C  06/24/2024, 8:56 AM [facility] Cardiology

## 2024-06-24 NOTE — Progress Notes (Signed)
 Referring Provider(s): Dr. Rodolph  Supervising Physician: Karalee Beat  Patient Status:  [facility] - In-pt  Chief Complaint: Diverticular abscess s/p drain placement on 11/11  Brief History:  68 year old female with a history of obesity, COPD, and polycythemia who presented to the ED on 11/10 with abdominal pain, worse in her right lower quadrant, for the past 2 months. In the ED she was hypotensive and febrile and imaging was concerning for acute sigmoid diverticulitis with abscess in the right hemipelvis. She ws admitted for complicated diverticulitis and suspected DKA. Surgery was consulted, but felt patient was too high risk for invasive procedure. IR consulted and patient underwent drain placement on 11/11 with Dr. ONEIDA Specking.   Subjective:  Patient is sitting upright on the side of the bed leaning against the beside table. Husband in the bedside chair. Patient reports improvement in her abdominal pain. Denies any pain at drain site or concerns with drain. She reports likely discharge tomorrow.   Allergies: Porcine (pork) protein-containing drug products  Medications: Prior to Admission medications   Not on File    Vital Signs: BP 110/65 (BP Location: Right Arm)   Pulse (!) 106   Temp 97.6 F (36.4 C)   Resp 20   Ht 5' 3 (1.6 m)   Wt 221 lb 11.2 oz (100.6 kg)   SpO2 94%   BMI 39.27 kg/m   Physical Exam Vitals reviewed.  Constitutional:      Appearance: She is obese.  Cardiovascular:     Rate and Rhythm: Normal rate.  Pulmonary:     Effort: Pulmonary effort is normal.  Abdominal:     General: Abdomen is flat.     Palpations: Abdomen is soft.     Tenderness: There is no abdominal tenderness.     Comments: RLQ drain sutured and stat locked in place. Mild bruising surrounding the insertion site, but non-tender to palpation. Skin is non-erythematous. Bulb to suction with <10cc of serous output. Flushes/aspirates without difficulty   Neurological:     Mental  Status: She is alert.     Drain Location: RLQ Size: Fr size: 10 Fr Date of placement: 06/22/24  Currently to: Drain collection device: suction bulb 24 hour output:  Output by Drain (mL) 06/22/24 0701 - 06/22/24 1900 06/22/24 1901 - 06/23/24 0700 06/23/24 0701 - 06/23/24 1900 06/23/24 1901 - 06/24/24 0700 06/24/24 0701 - 06/24/24 1556  Closed System Drain 1 Right;Anterior Hip Bulb (JP) 10.5 Fr. 0 30 10 5      Interval imaging/drain manipulation:  None  Current examination: Flushes/aspirates easily.  Insertion site unremarkable. Suture and stat lock in place. Dressed appropriately.    Labs:  CBC: Recent Labs    06/21/24 1017 06/22/24 0338 06/23/24 0137 06/24/24 0450  WBC 9.1 10.1 14.3* 15.7*  HGB 13.6 10.5* 11.2* 11.0*  HCT 44.1 33.2* 35.6* 35.2*  PLT 311 221 267 270    COAGS: Recent Labs    06/23/24 1204 06/23/24 1859 06/23/24 2239 06/24/24 0450  APTT 104* 92* 87* 75*    BMP: Recent Labs    06/21/24 1553 06/22/24 0338 06/23/24 0137 06/24/24 0450  NA 135 132* 135 133*  K 3.7 4.2 4.1 4.2  CL 99 98 98 96*  CO2 20* 23 26 27   GLUCOSE 177* 170* 173* 154*  BUN 15 22 17 19   CALCIUM 7.9* 8.2* 8.5* 8.5*  CREATININE 1.09* 0.97 0.87 0.96  GFRNONAA 56* >60 >60 >60    LIVER FUNCTION TESTS: Recent Labs  06/21/24 1017 06/22/24 0338 06/22/24 2006  BILITOT 1.1 0.9 0.5  AST 40 23 24  ALT 19 16 17   ALKPHOS 85 53 81  PROT 9.1* 7.0 7.1  ALBUMIN 3.5 2.9* 3.3*    Assessment and Plan:  Acute sigmoid diverticulitis with associated abscess: Yolanda Gray is a 68 y.o. female with a history of obesity, COPD, polycythemia, and several months of progressively worsening abdominal pain who presented to the ED on 11/10 for RLQ abdominal pain. Workup significant for acute sigmoid diverticulitis with abscess in the right hemipelvis. IR consulted and patient underwent drain placement on 11/11.  -Patient reports improvement in symptoms since drain placement -Husband  feels comfortable managing drain at home -Patient and husband given drain care instructions at the bedside. Verbalized understanding of what to do at home -Drain needs to be flushed daily at home and dressing changed every 2-3 days, or sooner if soiled -Initial supplies given; additional saline flushes can be picked up at [facility] if needed -Discharge instructions updated with at home drain care instructions   While Inpatient: Continue TID flushes with 5 cc NS. Record output Q shift. Dressing changes QD or PRN if soiled.  Call IR APP or on call IR MD if difficulty flushing or sudden change in drain output.   Discharge planning: Patient has already been provided with discharge instructions and at home drain care education. Initial drain care supplies provided.  Outpatient follow up order is placed. IR scheduler will contact patient with date/time of appointment. Patient will need to flush drain QD with 5 cc NS, record output QD, dressing changes every 2-3 days or earlier if soiled.   IR will continue to follow - please call with questions or concerns.   Thank you for allowing our service to participate in Yolanda Gray 's care.   Electronically Signed: Sonika Levins M Denym Christenberry, PA-C 06/24/2024, 3:38 PM    I spent a total of 25 Minutes at the the patient's bedside AND on the patient's hospital floor or unit, greater than 50% of which was counseling/coordinating care for drain care follow up.

## 2024-06-24 NOTE — Plan of Care (Signed)

## 2024-06-24 NOTE — Consult Note (Signed)
 PHARMACY - ANTICOAGULATION CONSULT NOTE  Pharmacy Consult for Argatroban Indication: new onset atrial fibrillation  Allergies  Allergen Reactions   Porcine (Pork) Protein-Containing Drug Products     Patient Measurements: Height: 5' 3 (160 cm) Weight: 100.6 kg (221 lb 11.2 oz) IBW/kg (Calculated) : 52.4 HEPARIN DW (KG): 76  Vital Signs: Temp: 98 F (36.7 C) (11/13 0412) Temp Source: Oral (11/13 0412) BP: 146/107 (11/13 0412) Pulse Rate: 72 (11/13 0412)  Labs: Recent Labs    06/21/24 1553 06/22/24 0338 06/22/24 2006 06/23/24 0137 06/23/24 0558 06/23/24 1859 06/23/24 2239 06/24/24 0450  HGB  --  10.5*  --  11.2*  --   --   --  11.0*  HCT  --  33.2*  --  35.6*  --   --   --  35.2*  PLT  --  221  --  267  --   --   --  270  APTT  --   --    < > 85*   < > 92* 87* 75*  CREATININE 1.09* 0.97  --  0.87  --   --   --   --    < > = values in this interval not displayed.    Estimated Creatinine Clearance: 72 mL/min (by C-G formula based on SCr of 0.87 mg/dL).   Medical History: Past Medical History:  Diagnosis Date   COPD (chronic obstructive pulmonary disease) (HCC)    Edentulous    Morbid obesity with BMI of 40.0-44.9, adult (HCC)    Obesity (BMI 30-39.9)    Polycythemia    Thrombocytopenia    Tobacco abuse    Tobacco abuse     Medications:  No history of chronic anticoagulant use PTA.  Assessment: 68 y.o. female with medical history significant for obesity, COPD not on home oxygen, ongoing active smoking, polycythemia who came into ED from home complaining of right lower quadrant abdominal pain intermittent for 2 months. Per note from attending, alerted by nurse to new tachycardia to 160s. Appears to be a-fib on tele and EKG confirms this. Per patient this is a new diagnosis. CHADSAVSAc 3 (MD, female, age). CBC remains stable. 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 Limited data with  argatroban with afib. Pharmacy consulted to initiate and dose argatroban infusion.    Goal of Therapy:  aPTT 50-90 seconds Monitor platelets by anticoagulation protocol: Yes  11/12 0137 aPTT 85, therapeutic x 1 11/12 0558 aPTT 110, supratherapeutic 11/12 1204  aPTT 104, supratherapeutic 11/12 1900 aPTT 92,  Supratherpeautic 11/12 2319 aPTT 87 Therapeutic x 1 11/13 0450 aPTT 75 Therapeutic x 2   Plan:  Continue infusion at 0.37 mcg/kg/hr. Recheck aPTT in ~12 hours to reconfirm. CBC daily while on argatroban.   Rankin CANDIE Dills, PharmD, [facility] 06/24/2024 5:38 AM

## 2024-06-25 ENCOUNTER — Other Ambulatory Visit: Payer: Self-pay

## 2024-06-25 DIAGNOSIS — K5720 Diverticulitis of large intestine with perforation and abscess without bleeding: Secondary | ICD-10-CM | POA: Diagnosis not present

## 2024-06-25 DIAGNOSIS — E1110 Type 2 diabetes mellitus with ketoacidosis without coma: Secondary | ICD-10-CM | POA: Diagnosis not present

## 2024-06-25 DIAGNOSIS — N179 Acute kidney failure, unspecified: Secondary | ICD-10-CM | POA: Diagnosis not present

## 2024-06-25 DIAGNOSIS — E669 Obesity, unspecified: Secondary | ICD-10-CM | POA: Diagnosis not present

## 2024-06-25 LAB — GLUCOSE, CAPILLARY
Glucose-Capillary: 140 mg/dL — ABNORMAL HIGH (ref 70–99)
Glucose-Capillary: 153 mg/dL — ABNORMAL HIGH (ref 70–99)
Glucose-Capillary: 208 mg/dL — ABNORMAL HIGH (ref 70–99)
Glucose-Capillary: 164 mg/dL — ABNORMAL HIGH (ref 70–99)
Glucose-Capillary: 210 mg/dL — ABNORMAL HIGH (ref 70–99)

## 2024-06-25 LAB — AEROBIC/ANAEROBIC CULTURE W GRAM STAIN (SURGICAL/DEEP WOUND)

## 2024-06-25 LAB — BASIC METABOLIC PANEL WITH GFR
Anion gap: 11 (ref 5–15)
BUN: 16 mg/dL (ref 8–23)
CO2: 26 mmol/L (ref 22–32)
Calcium: 8.4 mg/dL — ABNORMAL LOW (ref 8.9–10.3)
Chloride: 96 mmol/L — ABNORMAL LOW (ref 98–111)
Creatinine, Ser: 0.84 mg/dL (ref 0.44–1.00)
GFR, Estimated: >60 mL/min (ref >60)
Glucose, Bld: 137 mg/dL — ABNORMAL HIGH (ref 70–99)
Potassium: 3.7 mmol/L (ref 3.5–5.1)
Sodium: 134 mmol/L — ABNORMAL LOW (ref 135–145)

## 2024-06-25 LAB — CBC
HCT: 33.3 % — ABNORMAL LOW (ref 36.0–46.0)
Hemoglobin: 10.5 g/dL — ABNORMAL LOW (ref 12.0–15.0)
MCH: 27.4 pg (ref 26.0–34.0)
MCHC: 31.5 g/dL (ref 30.0–36.0)
MCV: 86.9 fL (ref 80.0–100.0)
Platelets: 282 K/uL (ref 150–400)
RBC: 3.83 MIL/uL — ABNORMAL LOW (ref 3.87–5.11)
RDW: 13.7 % (ref 11.5–15.5)
WBC: 11.2 K/uL — ABNORMAL HIGH (ref 4.0–10.5)
nRBC: 0 % (ref 0.0–0.2)

## 2024-06-25 LAB — APTT: aPTT: 76 s — ABNORMAL HIGH (ref 24–36)

## 2024-06-25 LAB — HCV AB W REFLEX TO QUANT PCR: HCV Ab: NONREACTIVE

## 2024-06-25 LAB — HCV INTERPRETATION

## 2024-06-25 MED ORDER — AMIODARONE HCL 200 MG PO TABS: 200.0000 mg | ORAL_TABLET | Freq: Every day | ORAL | Status: DC

## 2024-06-25 MED ORDER — METRONIDAZOLE 500 MG PO TABS: 500.0000 mg | ORAL_TABLET | Freq: Three times a day (TID) | ORAL | 0 refills | Status: AC

## 2024-06-25 MED ORDER — PANTOPRAZOLE SODIUM 40 MG PO TBEC
40.0000 mg | DELAYED_RELEASE_TABLET | Freq: Two times a day (BID) | ORAL | Status: DC
  Administered 2024-06-25 – 2024-06-26 (×2): 40 mg via ORAL
  Filled 2024-06-25 (×2): qty 1

## 2024-06-25 MED ORDER — APIXABAN 5 MG PO TABS
5.0000 mg | ORAL_TABLET | Freq: Two times a day (BID) | ORAL | Status: DC
  Administered 2024-06-25 – 2024-06-26 (×3): 5 mg via ORAL
  Filled 2024-06-25 (×3): qty 1

## 2024-06-25 MED ORDER — METRONIDAZOLE 500 MG PO TABS
500.0000 mg | ORAL_TABLET | Freq: Two times a day (BID) | ORAL | Status: DC
  Administered 2024-06-25 – 2024-06-26 (×2): 500 mg via ORAL
  Filled 2024-06-25 (×3): qty 1

## 2024-06-25 MED ORDER — INSULIN ASPART 100 UNIT/ML IJ SOLN
0.0000 [IU] | Freq: Three times a day (TID) | INTRAMUSCULAR | Status: DC
  Administered 2024-06-25: 1 [IU] via SUBCUTANEOUS
  Administered 2024-06-25: 3 [IU] via SUBCUTANEOUS
  Administered 2024-06-25: 1 [IU] via SUBCUTANEOUS
  Administered 2024-06-25: 2 [IU] via SUBCUTANEOUS
  Administered 2024-06-26 (×2): 1 [IU] via SUBCUTANEOUS
  Filled 2024-06-25 (×2): qty 1
  Filled 2024-06-25 (×2): qty 2
  Filled 2024-06-25 (×2): qty 1

## 2024-06-25 MED ORDER — CIPROFLOXACIN HCL 500 MG PO TABS
500.0000 mg | ORAL_TABLET | Freq: Two times a day (BID) | ORAL | Status: DC
  Administered 2024-06-25 – 2024-06-26 (×2): 500 mg via ORAL
  Filled 2024-06-25 (×3): qty 1

## 2024-06-25 MED ORDER — CIPROFLOXACIN HCL 500 MG PO TABS: 500.0000 mg | ORAL_TABLET | Freq: Two times a day (BID) | ORAL | 0 refills | Status: AC

## 2024-06-25 MED ORDER — SODIUM CHLORIDE FLUSH 0.9 % IV SOLN
10.0000 mL | Freq: Every day | INTRAVENOUS | 0 refills | Status: AC
  Filled 2024-06-25: qty 300, 30d supply, fill #0

## 2024-06-25 MED ORDER — INSULIN GLARGINE-YFGN 100 UNIT/ML ~~LOC~~ SOLN
15.0000 [IU] | Freq: Every day | SUBCUTANEOUS | Status: DC
  Administered 2024-06-26: 15 [IU] via SUBCUTANEOUS
  Filled 2024-06-25: qty 0.15

## 2024-06-25 MED ORDER — AMOXICILLIN-POT CLAVULANATE 875-125 MG PO TABS: 1.0000 | ORAL_TABLET | Freq: Two times a day (BID) | ORAL | 0 refills | Status: DC

## 2024-06-25 MED ORDER — AMIODARONE HCL 200 MG PO TABS
400.0000 mg | ORAL_TABLET | Freq: Two times a day (BID) | ORAL | Status: DC
  Administered 2024-06-25 – 2024-06-26 (×3): 400 mg via ORAL
  Filled 2024-06-25 (×3): qty 2

## 2024-06-25 MED ORDER — METOPROLOL TARTRATE 25 MG PO TABS
37.5000 mg | ORAL_TABLET | Freq: Two times a day (BID) | ORAL | Status: DC
  Administered 2024-06-25 – 2024-06-26 (×3): 37.5 mg via ORAL
  Filled 2024-06-25 (×3): qty 2

## 2024-06-25 MED ORDER — AMIODARONE HCL 200 MG PO TABS: 400.0000 mg | ORAL_TABLET | Freq: Two times a day (BID) | ORAL | Status: DC

## 2024-06-25 NOTE — Consult Note (Signed)
 PHARMACY - ANTICOAGULATION CONSULT NOTE  Pharmacy Consult for Argatroban Indication: new onset atrial fibrillation  Allergies  Allergen Reactions   Porcine (Pork) Protein-Containing Drug Products     Patient Measurements: Height: 5' 3 (160 cm) Weight: 100.6 kg (221 lb 11.2 oz) IBW/kg (Calculated) : 52.4 HEPARIN DW (KG): 76  Vital Signs: Temp: 97.3 F (36.3 C) (11/14 0410) BP: 137/74 (11/14 0410) Pulse Rate: 99 (11/14 0410)  Labs: Recent Labs    06/23/24 0137 06/23/24 0558 06/23/24 2239 06/24/24 0450 06/25/24 0420  HGB 11.2*  --   --  11.0* 10.5*  HCT 35.6*  --   --  35.2* 33.3*  PLT 267  --   --  270 282  APTT 85*   < > 87* 75* 76*  CREATININE 0.87  --   --  0.96 0.84   < > = values in this interval not displayed.    Estimated Creatinine Clearance: 74.6 mL/min (by C-G formula based on SCr of 0.84 mg/dL).   Medical History: Past Medical History:  Diagnosis Date   COPD (chronic obstructive pulmonary disease) (HCC)    Edentulous    Morbid obesity with BMI of 40.0-44.9, adult (HCC)    Obesity (BMI 30-39.9)    Polycythemia    Thrombocytopenia    Tobacco abuse    Tobacco abuse     Medications:  No history of chronic anticoagulant use PTA.  Assessment: 68 y.o. female with medical history significant for obesity, COPD not on home oxygen, ongoing active smoking, polycythemia who came into ED from home complaining of right lower quadrant abdominal pain intermittent for 2 months. Per note from attending, alerted by nurse to new tachycardia to 160s. Appears to be a-fib on tele and EKG confirms this. Per patient this is a new diagnosis. CHADSAVSAc 3 (MD, female, age). CBC remains stable. 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 Limited data with argatroban with afib. Pharmacy consulted to initiate and dose argatroban infusion.    Goal of Therapy:  aPTT 50-90 seconds Monitor platelets by  anticoagulation protocol: Yes  11/12 0137 aPTT 85, therapeutic x 1 11/12 0558 aPTT 110, supratherapeutic 11/12 1204  aPTT 104, supratherapeutic 11/12 1900 aPTT 92,  Supratherpeautic 11/12 2319 aPTT 87 Therapeutic x 1 11/13 0450 aPTT 75 Therapeutic x 2 11/14 0420 aPTT 76 Therapeutic x 3   Plan:  Continue infusion at 0.37 mcg/kg/hr. Recheck aPTT daily w/ AM labs while therapeutic.  CBC daily while on argatroban.   Rankin CANDIE Dills, PharmD, [facility] 06/25/2024 5:40 AM

## 2024-06-25 NOTE — Progress Notes (Signed)
 [facility]- General Surgery  SURGICAL PROGRESS NOTE  Hospital Day(s): 4.   Interval History:  Patient is doing well this morning. Has tolerated soft diet, denies any nausea or vomiting.  Denies any abdominal discomfort. Output in drain has remained minimal.  Does not have any other complaints.   Vital signs in last 24 hours: [min-max] current  Temp:  [97.3 F (36.3 C)-98.7 F (37.1 C)] 97.7 F (36.5 C) (11/14 0808) Pulse Rate:  [80-122] 104 (11/14 0808) Resp:  [17-18] 17 (11/14 0410) BP: (110-137)/(65-83) 128/75 (11/14 0808) SpO2:  [93 %-95 %] 95 % (11/14 0808)     Height: 5' 3 (160 cm) Weight: 100.6 kg BMI (Calculated): 39.28   Intake/Output last 2 shifts:  11/13 0701 - 11/14 0700 In: 1130.9 [P.O.:360; I.V.:619.6; IV Piggyback:141.3] Out: 5 [Drains:5]   Physical Exam:  Constitutional: alert, cooperative and no distress  Respiratory: breathing non-labored at rest  Cardiovascular: tachycardic and sinus rhythm  Gastrointestinal: soft, TTP on LUQ and epigastric area, and non-distended, perc drain in place on RLQ with minimal clear output   Labs:     Latest Ref Rng & Units 06/25/2024    4:20 AM 06/24/2024    4:50 AM 06/23/2024    1:37 AM  CBC  WBC 4.0 - 10.5 K/uL 11.2  15.7  14.3   Hemoglobin 12.0 - 15.0 g/dL 89.4  88.9  88.7   Hematocrit 36.0 - 46.0 % 33.3  35.2  35.6   Platelets 150 - 400 K/uL 282  270  267       Latest Ref Rng & Units 06/25/2024    4:20 AM 06/24/2024    4:50 AM 06/23/2024    1:37 AM  CMP  Glucose 70 - 99 mg/dL 862  845  826   BUN 8 - 23 mg/dL 16  19  17    Creatinine 0.44 - 1.00 mg/dL 9.15  9.03  9.12   Sodium 135 - 145 mmol/L 134  133  135   Potassium 3.5 - 5.1 mmol/L 3.7  4.2  4.1   Chloride 98 - 111 mmol/L 96  96  98   CO2 22 - 32 mmol/L 26  27  26    Calcium 8.9 - 10.3 mg/dL 8.4  8.5  8.5      Imaging studies: No new pertinent imaging studies   Assessment/Plan:  68 y.o. female with acute complicated diverticulitis with abscess and  fistula, complicated by pertinent comorbidities including diabetes, recent episode of DKA, new onset of A-fib RVR, COPD, obesity polycythemia, and history of tobacco abuse.    - S/p day 3 percutaneous drain placement    - No fever, not tachycardic and leukocytosis decreasing 15.7 >> 11.2   - Abdominal discomfort has been improving since drain was placed.  No worsening pain has been reported. No surgical intervention will be necessary during this admission. From our standpoint, patient is clear. She may be discharged once medically stable.  - Patient will transition to oral antibiotics and will continue 14-day course of Augmentin.  Will follow-up with us  outpatient in 2 weeks.  Will plan to further discuss interval sigmoidectomy with primary anastomosis to schedule in least 3 months to allow tissue to heal  - Patient should follow-up with interventional radiology to discuss drain management  - Continue to follow cardiology recommendations in regards to new onset A-fib and hospitalist in regards to newly diagnosed diabetes  -- Ayinde Swim Barrientos PA-C

## 2024-06-25 NOTE — Progress Notes (Signed)
 [facility] CLINIC CARDIOLOGY PROGRESS NOTE       Patient ID: Yolanda Gray MRN: [id number] DOB/AGE: 1956-06-12 68 y.o.  Admit date: 06/21/2024 Referring Physician Dr. Delayne Solian Primary Physician Pcp, No  Primary Cardiologist None Reason for Consultation atrial fibrillation RVR  HPI: Yolanda Gray is a 68 y.o. female  with a past medical history of COPD, tobacco use, polycythemia, obesity who presented to the ED on 06/21/2024 for abdominal pain.  Found to be septic with acute diverticulitis with abscess.  Underwent drain placement yesterday.  Developed atrial fibrillation RVR on the evening of 06/22/2024.  Cardiology was consulted for further evaluation.   Interval history: - Patient seen and examined this morning, resting comfortably in hospital bed. - Denies any abdominal pain this morning, denies chest pain or shortness of breath. - Heart rate improved today, BP is stable.  Review of systems complete and found to be negative unless listed above    Past Medical History:  Diagnosis Date   COPD (chronic obstructive pulmonary disease) (HCC)    Edentulous    Morbid obesity with BMI of 40.0-44.9, adult (HCC)    Obesity (BMI 30-39.9)    Polycythemia    Thrombocytopenia    Tobacco abuse    Tobacco abuse     Past Surgical History:  Procedure Laterality Date   CATARACT EXTRACTION W/PHACO Left 10/09/2023   Procedure: CATARACT EXTRACTION PHACO AND INTRAOCULAR LENS PLACEMENT (IOC) LEFT MALYUGIN OMIDRIA  13.44 01:09.5;  Surgeon: Enola Feliciano Hugger, MD;  Location: [facility];  Service: Ophthalmology;  Laterality: Left;   CATARACT EXTRACTION W/PHACO Right 10/30/2023   Procedure: PHACOEMULSIFICATION, CATARACT, WITH IOL INSERTION 6.65, 00:53.8;  Surgeon: Enola Feliciano Hugger, MD;  Location: [facility];  Service: Ophthalmology;  Laterality: Right;   CESAREAN SECTION     TONSILLECTOMY      No medications prior to admission.   Social History   Socioeconomic History    Marital status: Married    Spouse name: Not on file   Number of children: Not on file   Years of education: Not on file   Highest education level: Not on file  Occupational History   Not on file  Tobacco Use   Smoking status: Former    Current packs/day: 0.00    Types: Cigarettes    Quit date: 2022    Years since quitting: 3.8   Smokeless tobacco: Never  Vaping Use   Vaping status: Never Used  Substance and Sexual Activity   Alcohol use: Not Currently   Drug use: Never   Sexual activity: Not on file  Other Topics Concern   Not on file  Social History Narrative   Not on file   Social Drivers of Health   Financial Resource Strain: Not on file  Food Insecurity: No Food Insecurity (06/22/2024)   Hunger Vital Sign    Worried About Running Out of Food in the Last Year: Never true    Ran Out of Food in the Last Year: Never true  Transportation Needs: No Transportation Needs (06/22/2024)   PRAPARE - Administrator, Civil Service (Medical): No    Lack of Transportation (Non-Medical): No  Physical Activity: Not on file  Stress: Not on file  Social Connections: Unknown (06/22/2024)   Social Connection and Isolation Panel    Frequency of Communication with Friends and Family: More than three times a week    Frequency of Social Gatherings with Friends and Family: More than three times a week  Attends Religious Services: More than 4 times per year    Active Member of Clubs or Organizations: Patient declined    Attends Banker Meetings: Patient declined    Marital Status: Not on file  Intimate Partner Violence: Not At Risk (06/22/2024)   Humiliation, Afraid, Rape, and Kick questionnaire    Fear of Current or Ex-Partner: No    Emotionally Abused: No    Physically Abused: No    Sexually Abused: No    Family History  Problem Relation Age of Onset   Asthma Mother    Heart attack Mother    Heart attack Father      Vitals:   06/24/24 1955 06/24/24  2342 06/25/24 0410 06/25/24 0808  BP: 120/67 123/76 137/74 128/75  Pulse: (!) 122 80 99 (!) 104  Resp: 18 18 17 16   Temp: 98.7 F (37.1 C) 97.6 F (36.4 C) (!) 97.3 F (36.3 C) 97.7 F (36.5 C)  TempSrc:      SpO2: 94% 95% 93% 95%  Weight:      Height:        PHYSICAL EXAM General: Chronically ill-appearing female, well nourished, in no acute distress. HEENT: Normocephalic and atraumatic. Neck: No JVD.  Lungs: Normal respiratory effort on room air. Clear bilaterally to auscultation. No wheezes, crackles, rhonchi.  Heart: Irregularly irregular, borderline right. Normal S1 and S2 without gallops or murmurs.  Abdomen: Non-distended appearing.  Msk: Normal strength and tone for age. Extremities: Warm and well perfused. No clubbing, cyanosis.  No edema.  Neuro: Alert and oriented X 3. Psych: Answers questions appropriately.   Labs: Basic Metabolic Panel: Recent Labs    06/24/24 0446 06/24/24 0450 06/25/24 0420  NA  --  133* 134*  K  --  4.2 3.7  CL  --  96* 96*  CO2  --  27 26  GLUCOSE  --  154* 137*  BUN  --  19 16  CREATININE  --  0.96 0.84  CALCIUM  --  8.5* 8.4*  MG 2.1  --   --    Liver Function Tests: Recent Labs    06/22/24 2006  AST 24  ALT 17  ALKPHOS 81  BILITOT 0.5  PROT 7.1  ALBUMIN 3.3*   No results for input(s): LIPASE, AMYLASE in the last 72 hours.  CBC: Recent Labs    06/24/24 0450 06/25/24 0420  WBC 15.7* 11.2*  HGB 11.0* 10.5*  HCT 35.2* 33.3*  MCV 88.9 86.9  PLT 270 282   Cardiac Enzymes: No results for input(s): CKTOTAL, CKMB, CKMBINDEX, TROPONINIHS in the last 72 hours. BNP: No results for input(s): BNP in the last 72 hours. D-Dimer: No results for input(s): DDIMER in the last 72 hours. Hemoglobin A1C: No results for input(s): HGBA1C in the last 72 hours.  Fasting Lipid Panel: No results for input(s): CHOL, HDL, LDLCALC, TRIG, CHOLHDL, LDLDIRECT in the last 72 hours. Thyroid Function  Tests: Recent Labs    06/22/24 1810  TSH 2.230   Anemia Panel: No results for input(s): VITAMINB12, FOLATE, FERRITIN, TIBC, IRON, RETICCTPCT in the last 72 hours.   Radiology: ECHOCARDIOGRAM COMPLETE Result Date: 06/24/2024    ECHOCARDIOGRAM REPORT   Patient Name:   BRYONNA SUNDBY Date of Exam: 06/24/2024 Medical Rec #:  [id number]    Height:       63.0 in Accession #:    [id number]   Weight:       221.7 lb Date of Birth:  1955/10/06  BSA:          2.020 m Patient Age:    67 years     BP:           146/107 mmHg Patient Gender: F            HR:           136 bpm. Exam Location:  [facility] Procedure: 2D Echo, Cardiac Doppler, Color Doppler and Intracardiac            Opacification Agent (Both Spectral and Color Flow Doppler were            utilized during procedure). Indications:     Atrial fibrillation  History:         Patient has no prior history of Echocardiogram examinations.                  COPD; Risk Factors:Diabetes.  Sonographer:     Philomena Daring Referring Phys:  [id number] Andreah Goheen Diagnosing Phys: Evalene Lunger MD  Sonographer Comments: Technically difficult study due to poor echo windows. IMPRESSIONS  1. Left ventricular ejection fraction, by estimation, is 60 to 65%. The left ventricle has normal function. The left ventricle has no regional wall motion abnormalities. Left ventricular diastolic parameters are indeterminate.  2. Right ventricular systolic function is normal. The right ventricular size is normal. There is normal pulmonary artery systolic pressure. The estimated right ventricular systolic pressure is 30.0 mmHg.  3. The mitral valve is normal in structure. No evidence of mitral valve regurgitation. No evidence of mitral stenosis.  4. The aortic valve is normal in structure. Aortic valve regurgitation is not visualized. No aortic stenosis is present.  5. The inferior vena cava is normal in size with greater than 50% respiratory variability, suggesting right atrial pressure  of 3 mmHg. FINDINGS  Left Ventricle: Left ventricular ejection fraction, by estimation, is 60 to 65%. The left ventricle has normal function. The left ventricle has no regional wall motion abnormalities. Definity contrast agent was given IV to delineate the left ventricular  endocardial borders. Strain was performed and the global longitudinal strain is indeterminate. The left ventricular internal cavity size was normal in size. There is no left ventricular hypertrophy. Left ventricular diastolic parameters are indeterminate. Right Ventricle: The right ventricular size is normal. No increase in right ventricular wall thickness. Right ventricular systolic function is normal. There is normal pulmonary artery systolic pressure. The tricuspid regurgitant velocity is 2.50 m/s, and  with an assumed right atrial pressure of 5 mmHg, the estimated right ventricular systolic pressure is 30.0 mmHg. Left Atrium: Left atrial size was normal in size. Right Atrium: Right atrial size was normal in size. Pericardium: There is no evidence of pericardial effusion. Mitral Valve: The mitral valve is normal in structure. Mild mitral annular calcification. No evidence of mitral valve regurgitation. No evidence of mitral valve stenosis. Tricuspid Valve: The tricuspid valve is normal in structure. Tricuspid valve regurgitation is mild . No evidence of tricuspid stenosis. Aortic Valve: The aortic valve is normal in structure. Aortic valve regurgitation is not visualized. No aortic stenosis is present. Pulmonic Valve: The pulmonic valve was normal in structure. Pulmonic valve regurgitation is not visualized. No evidence of pulmonic stenosis. Aorta: The aortic root is normal in size and structure. Venous: The inferior vena cava is normal in size with greater than 50% respiratory variability, suggesting right atrial pressure of 3 mmHg. IAS/Shunts: No atrial level shunt detected by color flow Doppler. Additional Comments: 3D was  performed not  requiring image post processing on an independent workstation and was indeterminate.  LEFT VENTRICLE PLAX 2D LVIDd:         4.80 cm   Diastology LVIDs:         3.20 cm   LV e' medial:  8.85 cm/s LV PW:         1.00 cm   LV e' lateral: 13.23 cm/s LV IVS:        1.20 cm LVOT diam:     2.10 cm LV SV:         67 LV SV Index:   33 LVOT Area:     3.46 cm  RIGHT VENTRICLE             IVC RV S prime:     10.75 cm/s  IVC diam: 2.10 cm TAPSE (M-mode): 1.6 cm LEFT ATRIUM             Index        RIGHT ATRIUM           Index LA diam:        4.00 cm 1.98 cm/m   RA Area:     16.50 cm LA Vol (A2C):   48.5 ml 24.01 ml/m  RA Volume:   35.60 ml  17.62 ml/m LA Vol (A4C):   34.2 ml 16.93 ml/m LA Biplane Vol: 41.4 ml 20.49 ml/m  AORTIC VALVE LVOT Vmax:   127.50 cm/s LVOT Vmean:  81.100 cm/s LVOT VTI:    0.192 m  AORTA Ao Root diam: 3.00 cm TRICUSPID VALVE TR Peak grad:   25.0 mmHg TR Vmax:        250.00 cm/s  SHUNTS Systemic VTI:  0.19 m Systemic Diam: 2.10 cm Evalene Lunger MD Electronically signed by Evalene Lunger MD Signature Date/Time: 06/24/2024/11:12:09 AM    Final    CT GUIDED PERITONEAL/RETROPERITONEAL FLUID DRAIN BY PERC CATH Result Date: 06/22/2024 INDICATION: Right lower quadrant diverticular abscess EXAM: CT DRAINAGE OF THE RIGHT LOWER QUADRANT ABSCESS MEDICATIONS: The patient is currently admitted to the hospital and receiving intravenous antibiotics. The antibiotics were administered within an appropriate time frame prior to the initiation of the procedure. ANESTHESIA/SEDATION: Moderate (conscious) sedation was employed during this procedure. A total of Versed  2 mg and Fentanyl  50 mcg was administered intravenously by the radiology nurse. Total intra-service moderate Sedation Time: 12 minutes. The patient's level of consciousness and vital signs were monitored continuously by radiology nursing throughout the procedure under my direct supervision. COMPLICATIONS: None immediate. PROCEDURE: Informed written  consent was obtained from the patient after a thorough discussion of the procedural risks, benefits and alternatives. All questions were addressed. Maximal Sterile Barrier Technique was utilized including caps, mask, sterile gowns, sterile gloves, sterile drape, hand hygiene and skin antiseptic. A timeout was performed prior to the initiation of the procedure. Previous imaging reviewed. Patient positioned supine. Noncontrast localization CT performed. The right lower quadrant diverticular abscess was localized and marked. Under sterile conditions local anesthesia, the 18 gauge 15 cm access needle was advanced from a right lower quadrant anterior oblique approach into the abscess. Needle position confirmed with CT. Syringe aspiration yielded purulent fluid. Sample sent for culture. Guidewire inserted followed by tract dilatation to insert a 10 French drain. Drain catheter position confirmed with CT. Syringe five cc blood tinged purulent fluid. Catheter secured with a silk suture and sterile dressing. Suction bulb applied. No immediate complication. Patient tolerated the procedure well. IMPRESSION: Successful CT-guided right lower quadrant diverticular  abscess drain placement. Electronically Signed   By: CHRISTELLA.  Shick M.D.   On: 06/22/2024 13:20   DG Chest Port 1 View Result Date: 06/21/2024 CLINICAL DATA:  Fever. EXAM: PORTABLE CHEST 1 VIEW COMPARISON:  Chest radiograph dated 09/22/2021. FINDINGS: There is cardiomegaly with central vascular congestion. No focal consolidation, pleural effusion, pneumothorax. No acute osseous pathology. IMPRESSION: Cardiomegaly with central vascular congestion. No focal consolidation. Electronically Signed   By: Vanetta Chou M.D.   On: 06/21/2024 13:28   CT ABDOMEN PELVIS W CONTRAST Result Date: 06/21/2024 CLINICAL DATA:  Right lower quadrant abdominal pain. EXAM: CT ABDOMEN AND PELVIS WITH CONTRAST TECHNIQUE: Multidetector CT imaging of the abdomen and pelvis was performed  using the standard protocol following bolus administration of intravenous contrast. RADIATION DOSE REDUCTION: This exam was performed according to the departmental dose-optimization program which includes automated exposure control, adjustment of the mA and/or kV according to patient size and/or use of iterative reconstruction technique. CONTRAST:  80mL OMNIPAQUE IOHEXOL 300 MG/ML  SOLN COMPARISON:  None Available. FINDINGS: Lower chest: No acute abnormality. No intra-abdominal free air.  Small free fluid in the pelvis. Hepatobiliary: Fatty appearing liver. No biliary ductal dilatation. The gallbladder is unremarkable. Pancreas: Unremarkable. No pancreatic ductal dilatation or surrounding inflammatory changes. Spleen: Normal in size without focal abnormality. Adrenals/Urinary Tract: The right adrenal glands unremarkable. There is a 1.5 cm indeterminate left adrenal nodule. There is no hydronephrosis on either side. The visualized ureters and urinary bladder appear unremarkable. Stomach/Bowel: There is severe sigmoid diverticulosis. There is active inflammatory changes consistent with acute diverticulitis. There is a 5.3 x 4.0 cm complex collection in the right hemipelvis medial to the right ovary containing fluid and air consistent with diverticular abscess. There is a linear tract extending from the distal sigmoid colon to a segment of the sigmoid colon more proximally (series 2 images 56-65) consistent with a fistula. Mildly dilated bowel loop in the left hemiabdomen measures 3.5 cm, likely a reactive ileus. Developing obstruction is less likely but not excluded. The appendix is not visualized with certainty. No inflammatory changes identified in the right lower quadrant. Vascular/Lymphatic: Moderate aortoiliac atherosclerotic disease. The IVC is unremarkable. No portal venous gas. There is no adenopathy. Reproductive: The uterus is anteverted. Uterine fibroids noted. No suspicious adnexal masses. Other: None  Musculoskeletal: Degenerative changes of the spine. No acute osseous pathology. IMPRESSION: 1. Acute sigmoid diverticulitis with a 5.3 x 4.0 cm diverticular abscess in the right hemipelvis. 2. Sigmoid-sigmoid fistula. 3. Mildly dilated bowel loop in the left hemiabdomen, likely a reactive ileus. Developing obstruction is less likely but not excluded. 4. Fatty liver. 5. Uterine fibroids. 6.  Aortic Atherosclerosis (ICD10-I70.0). Electronically Signed   By: Vanetta Chou M.D.   On: 06/21/2024 13:12    ECHO as above  TELEMETRY (personally reviewed): Atrial fibrillation rate 90s  EKG (personally reviewed): No EKG for review  Data reviewed by me 06/25/2024: last 24h vitals tele labs imaging I/O ED provider note, admission H&P, hospitalist progress note, general surgery notes  Principal Problem:   DKA (diabetic ketoacidosis) (HCC) Active Problems:   Obesity (BMI 30-39.9)   Abscess of sigmoid colon due to diverticulitis   Septic shock (HCC)   AKI (acute kidney injury)   T2DM (type 2 diabetes mellitus) (HCC)    ASSESSMENT AND PLAN:  CHELCY BOLDA is a 68 y.o. female  with a past medical history of COPD, tobacco use, polycythemia, obesity who presented to the ED on 06/21/2024 for abdominal pain.  Found to  be septic with acute diverticulitis with abscess.  Underwent drain placement yesterday.  Developed atrial fibrillation RVR on the evening of 06/22/2024.  Cardiology was consulted for further evaluation.   # Atrial fibrillation RVR # New onset atrial fibrillation # Acute diverticulitis # Sepsis Patient initially presented with abdominal pain found to have acute diverticulitis and was septic.  Has been treated with antibiotics.  On the evening of 06/22/2024 she developed atrial fibrillation RVR.  Initially treated with metoprolol  however became hypotensive, then started on IV digoxin.  Started on IV argatroban for anticoagulation as she has allergy to porcine products.  Echo this admission with  EF 60-65%, no wall motion abnormalities, indeterminate diastolic function, RV normal size and function. - Will transition to Eliquis 5 mg twice daily today.  Patient to receive 1 month co-pay card for Eliquis, co-pay will be high after this so we will plan to discuss other options for anticoagulation in clinic. - Continuing IV amiodarone this morning.  If rates stable this afternoon will likely transition to p.o. - Increase metoprolol  to 37.5 mg BID.   This patient's plan of care was discussed and created with Dr. Ammon and he is in agreement.  Signed: Danita Bloch, PA-C  06/25/2024, 10:44 AM [facility] Cardiology

## 2024-06-25 NOTE — Progress Notes (Signed)
 PROGRESS NOTE    Yolanda Gray  FMW:[id number] DOB: 12-12-1955 DOA: 06/21/2024 PCP: Pcp, No  Chief Complaint  Patient presents with   Abdominal Pain    Hospital Course:  Yolanda Gray is a 68 year old female with obesity, COPD not on O2, active tobacco abuse, polycythemia, does not consistently see a physician, presented to the ED complaining of right lower quadrant abdominal pain which had been intermittent for 2 months prior to arrival.  Patient was found to be septic and in DKA with a new diabetes diagnosis hemoglobin A1c 10.7%.  She was also found to have acute sigmoid diverticulitis with an abscess 5.3 x 4 cm in the right hemipelvis.  She underwent IR drain placement on 11/11 and initiated on Zosyn. Patient was improving but late in the evening on 11/11 she went into A-fib RVR.  She was given oral metoprolol  and started on argatroban drip.  Echocardiogram was ordered.  She became hypotensive after metoprolol .  She was started on digoxin, blood pressure stabilized. Medications were slowly titrated until 11/14 and patient was initiated on Eliquis and transitioning to p.o. amiodarone.  Subjective: Patient reports that she is feeling better today, she remains anxious to discharge home.  Objective: Vitals:   06/24/24 2342 06/25/24 0410 06/25/24 0808 06/25/24 1202  BP: 123/76 137/74 128/75 115/68  Pulse: 80 99 (!) 104 86  Resp: 18 17 16    Temp: 97.6 F (36.4 C) (!) 97.3 F (36.3 C) 97.7 F (36.5 C) 98.1 F (36.7 C)  TempSrc:      SpO2: 95% 93% 95% 97%  Weight:      Height:        Intake/Output Summary (Last 24 hours) at 06/25/2024 1344 Last data filed at 06/24/2024 2242 Gross per 24 hour  Intake 1130.87 ml  Output 5 ml  Net 1125.87 ml   Filed Weights   06/21/24 1254  Weight: 100.6 kg    Examination: General exam: Appears calm and comfortable, NAD  Respiratory system: No work of breathing, symmetric chest wall expansion Cardiovascular system: S1 & S2 heard, rapid  irregular rhythm Gastrointestinal system: Abdomen is nondistended, soft and nontender.  Abdominal drain in place, minimal fluid in JP bulb. Neuro: Alert and oriented. No focal neurological deficits. Extremities: Symmetric, expected ROM  Assessment & Plan:  Principal Problem:   DKA (diabetic ketoacidosis) (HCC) Active Problems:   Obesity (BMI 30-39.9)   Abscess of sigmoid colon due to diverticulitis   Septic shock (HCC)   AKI (acute kidney injury)   T2DM (type 2 diabetes mellitus) (HCC)    A-fib RVR - Began 11/11, likely provoked by sepsis. - Started on argatroban drip, endorses severe allergy to pork.  Transition to Eliquis today - Have requested pharmacy to confirm cost and affordability - Amiodarone IV transitioning to p.o. today - Continue to correct electrolytes.   - Echo: LVEF 60 to 65%, no RWMA, diastolic parameters indeterminate.  No significant valvular dysfunction noted - Cardiology following.  If rate controlled we will discharge on this current regimen  Sepsis, severe - On arrival tachycardic, hypotensive, elevated lactic, briefly required pressors. - Treatment for acute conditions as below  Acute sigmoid diverticulitis with abscess - 5.3 x 4 cm abscess in the right hemipelvis - 11/11 IR drain placement.  Drain output has been very minimal. -Cultures with some E. coli, still holding for anaerobe growth - WBC beginning to resolve - Now on Cipro Flagyl, can discharge with this. - Planning to discharge with drain and follow-up in surgery  clinic in 2 weeks.  General surgery considering sigmoidectomy with primary anastomosis in 3 months - IR to follow-up on drain management to provide supplies prior to DC - No plans for repeat imaging during this admission - Blood cultures remain negative - Tolerating diet  DKA Type 2 diabetes - Diabetes diagnosis.  Hemoglobin A1c 10.7% - Does not see a primary care physician outpatient.  Glucose 400s on arrival - Continue with  basal/bolus and sliding scale insulin for now.  Continue to titrate daily - Diabetes educator has been consulted for patient education  AKI - Has resolved with IV fluids - Monitor kidney function closely  BMI 39 Obesity class II - Outpatient follow up for lifestyle modification and risk factor management   DVT prophylaxis: Eliquis   Code Status: Full Code Disposition: Inpatient pending clinical improvement.  Hopefully DC in a.m.  Consultants:  Treatment Team:  Consulting Physician: Rodolph Romano, MD Consulting Physician: Ruthellen Ruthellen [facility], MD Consulting Physician: Ammon Blunt, MD  Procedures:  11/11 IR drain placement  Antimicrobials:  Anti-infectives (From admission, onward)    Start     Dose/Rate Route Frequency Ordered Stop   06/25/24 2200  metroNIDAZOLE (FLAGYL) tablet 500 mg        500 mg Oral Every 12 hours 06/25/24 1256 06/29/24 0959   06/25/24 2000  ciprofloxacin (CIPRO) tablet 500 mg        500 mg Oral 2 times daily 06/25/24 1256 06/29/24 0759   06/25/24 0000  amoxicillin-clavulanate (AUGMENTIN) 875-125 MG tablet  Status:  Discontinued        1 tablet Oral 2 times daily 06/25/24 0928 06/25/24    06/25/24 0000  metroNIDAZOLE (FLAGYL) 500 MG tablet        500 mg Oral 3 times daily 06/25/24 1303 07/09/24 2359   06/25/24 0000  ciprofloxacin (CIPRO) 500 MG tablet        500 mg Oral 2 times daily 06/25/24 1303 07/09/24 2359   06/22/24 2130  piperacillin-tazobactam (ZOSYN) IVPB 3.375 g  Status:  Discontinued        3.375 g 12.5 mL/hr over 240 Minutes Intravenous Every 8 hours 06/22/24 2100 06/25/24 1256   06/21/24 1415  piperacillin-tazobactam (ZOSYN) IVPB 3.375 g  Status:  Discontinued        3.375 g 12.5 mL/hr over 240 Minutes Intravenous Every 8 hours 06/21/24 1400 06/22/24 2100       Data Reviewed: I have personally reviewed following labs and imaging studies CBC: Recent Labs  Lab 06/21/24 1017 06/22/24 0338 06/23/24 0137  06/24/24 0450 06/25/24 0420  WBC 9.1 10.1 14.3* 15.7* 11.2*  HGB 13.6 10.5* 11.2* 11.0* 10.5*  HCT 44.1 33.2* 35.6* 35.2* 33.3*  MCV 90.9 86.9 88.3 88.9 86.9  PLT 311 221 267 270 282   Basic Metabolic Panel: Recent Labs  Lab 06/21/24 1553 06/22/24 0338 06/23/24 0137 06/24/24 0446 06/24/24 0450 06/25/24 0420  NA 135 132* 135  --  133* 134*  K 3.7 4.2 4.1  --  4.2 3.7  CL 99 98 98  --  96* 96*  CO2 20* 23 26  --  27 26  GLUCOSE 177* 170* 173*  --  154* 137*  BUN 15 22 17   --  19 16  CREATININE 1.09* 0.97 0.87  --  0.96 0.84  CALCIUM 7.9* 8.2* 8.5*  --  8.5* 8.4*  MG  --   --   --  2.1  --   --    GFR: Estimated Creatinine Clearance:  74.6 mL/min (by C-G formula based on SCr of 0.84 mg/dL). Liver Function Tests: Recent Labs  Lab 06/21/24 1017 06/22/24 0338 06/22/24 2006  AST 40 23 24  ALT 19 16 17   ALKPHOS 85 53 81  BILITOT 1.1 0.9 0.5  PROT 9.1* 7.0 7.1  ALBUMIN 3.5 2.9* 3.3*   CBG: Recent Labs  Lab 06/24/24 1958 06/24/24 2343 06/25/24 0411 06/25/24 0809 06/25/24 1159  GLUCAP 209* 164* 140* 153* 208*    Recent Results (from the past 240 hours)  Resp panel by RT-PCR (RSV, Flu A&B, Covid) Anterior Nasal Swab     Status: None   Collection Time: 06/21/24 10:18 AM   Specimen: Anterior Nasal Swab  Result Value Ref Range Status   SARS Coronavirus 2 by RT PCR NEGATIVE NEGATIVE Final    Comment: (NOTE) SARS-CoV-2 target nucleic acids are NOT DETECTED.  The SARS-CoV-2 RNA is generally detectable in upper respiratory specimens during the acute phase of infection. The lowest concentration of SARS-CoV-2 viral copies this assay can detect is 138 copies/mL. A negative result does not preclude SARS-Cov-2 infection and should not be used as the sole basis for treatment or other patient management decisions. A negative result may occur with  improper specimen collection/handling, submission of specimen other than nasopharyngeal swab, presence of viral mutation(s)  within the areas targeted by this assay, and inadequate number of viral copies(<138 copies/mL). A negative result must be combined with clinical observations, patient history, and epidemiological information. The expected result is Negative.  Fact Sheet for Patients:  bloggercourse.com  Fact Sheet for Healthcare Providers:  seriousbroker.it  This test is no t yet approved or cleared by the [place]  FDA and  has been authorized for detection and/or diagnosis of SARS-CoV-2 by FDA under an Emergency Use Authorization (EUA). This EUA will remain  in effect (meaning this test can be used) for the duration of the COVID-19 declaration under Section 564(b)(1) of the Act, 21 U.S.C.section 360bbb-3(b)(1), unless the authorization is terminated  or revoked sooner.       Influenza A by PCR NEGATIVE NEGATIVE Final   Influenza B by PCR NEGATIVE NEGATIVE Final    Comment: (NOTE) The Xpert Xpress SARS-CoV-2/FLU/RSV plus assay is intended as an aid in the diagnosis of influenza from Nasopharyngeal swab specimens and should not be used as a sole basis for treatment. Nasal washings and aspirates are unacceptable for Xpert Xpress SARS-CoV-2/FLU/RSV testing.  Fact Sheet for Patients: bloggercourse.com  Fact Sheet for Healthcare Providers: seriousbroker.it  This test is not yet approved or cleared by the [place]  FDA and has been authorized for detection and/or diagnosis of SARS-CoV-2 by FDA under an Emergency Use Authorization (EUA). This EUA will remain in effect (meaning this test can be used) for the duration of the COVID-19 declaration under Section 564(b)(1) of the Act, 21 U.S.C. section 360bbb-3(b)(1), unless the authorization is terminated or revoked.     Resp Syncytial Virus by PCR NEGATIVE NEGATIVE Final    Comment: (NOTE) Fact Sheet for  Patients: bloggercourse.com  Fact Sheet for Healthcare Providers: seriousbroker.it  This test is not yet approved or cleared by the [place]  FDA and has been authorized for detection and/or diagnosis of SARS-CoV-2 by FDA under an Emergency Use Authorization (EUA). This EUA will remain in effect (meaning this test can be used) for the duration of the COVID-19 declaration under Section 564(b)(1) of the Act, 21 U.S.C. section 360bbb-3(b)(1), unless the authorization is terminated or revoked.  Performed at [facility], [phone]  [address] [place], [place] [address]   Culture, blood (Routine X 2) w Reflex to ID Panel     Status: None (Preliminary result)   Collection Time: 06/21/24  2:00 PM   Specimen: BLOOD  Result Value Ref Range Status   Specimen Description BLOOD BLOOD RIGHT HAND  Final   Special Requests   Final    BOTTLES DRAWN AEROBIC AND ANAEROBIC Blood Culture results may not be optimal due to an inadequate volume of blood received in culture bottles   Culture   Final    NO GROWTH 4 DAYS Performed at [facility], [address], [place], [place] [address]    Report Status PENDING  Incomplete  Culture, blood (Routine X 2) w Reflex to ID Panel     Status: None (Preliminary result)   Collection Time: 06/21/24  2:00 PM   Specimen: BLOOD  Result Value Ref Range Status   Specimen Description BLOOD RIGHT ANTECUBITAL  Final   Special Requests   Final    BOTTLES DRAWN AEROBIC AND ANAEROBIC Blood Culture results may not be optimal due to an inadequate volume of blood received in culture bottles   Culture   Final    NO GROWTH 4 DAYS Performed at [facility], [address] [place], [place] [address]    Report Status PENDING  Incomplete  Aerobic/Anaerobic Culture w Gram Stain (surgical/deep wound)     Status: None (Preliminary result)   Collection Time: 06/22/24 12:52 PM   Specimen: Abdomen; Abscess   Result Value Ref Range Status   Specimen Description   Final    ABDOMEN Performed at [facility], [address] [place], [place] [address]    Special Requests   Final    NONE Performed at [facility], [address] [place], [place] [address]    Gram Stain   Final    RARE WBC PRESENT, PREDOMINANTLY PMN FEW GRAM POSITIVE COCCI FEW GRAM NEGATIVE RODS    Culture   Final    Two isolates with different morphologies were identified as the same organism.The most resistant organism was reported. MODERATE ESCHERICHIA COLI HOLDING FOR POSSIBLE ANAEROBE Performed at [facility], [address] [place], [place] [address]    Report Status PENDING  Incomplete   Organism ID, Bacteria ESCHERICHIA COLI  Final      Susceptibility   Escherichia coli - MIC*    AMPICILLIN >=32 RESISTANT Resistant     CEFAZOLIN (NON-URINE) 8 RESISTANT Resistant     CEFEPIME <=0.12 SENSITIVE Sensitive     ERTAPENEM <=0.12 SENSITIVE Sensitive     CEFTRIAXONE <=0.25 SENSITIVE Sensitive     CIPROFLOXACIN <=0.06 SENSITIVE Sensitive     GENTAMICIN >=16 RESISTANT Resistant     MEROPENEM <=0.25 SENSITIVE Sensitive     TRIMETH/SULFA >=320 RESISTANT Resistant     AMPICILLIN/SULBACTAM 16 INTERMEDIATE Intermediate     PIP/TAZO Value in next row Sensitive      <=4 SENSITIVEThis is a modified FDA-approved test that has been validated and its performance characteristics determined by the reporting laboratory.  This laboratory is certified under the Clinical Laboratory Improvement Amendments CLIA as qualified to perform high complexity clinical laboratory testing.    * MODERATE ESCHERICHIA COLI     Radiology Studies: ECHOCARDIOGRAM COMPLETE Result Date: 06/24/2024    ECHOCARDIOGRAM REPORT   Patient Name:   KAMIKA GOODLOE Date of Exam: 06/24/2024 Medical Rec #:  [id number]    Height:  63.0 in Accession #:    [id number]   Weight:       221.7 lb Date of Birth:  [birth year]   BSA:          2.020  m Patient Age:    67 years     BP:           146/107 mmHg Patient Gender: F            HR:           136 bpm. Exam Location:  [facility] Procedure: 2D Echo, Cardiac Doppler, Color Doppler and Intracardiac            Opacification Agent (Both Spectral and Color Flow Doppler were            utilized during procedure). Indications:     Atrial fibrillation  History:         Patient has no prior history of Echocardiogram examinations.                  COPD; Risk Factors:Diabetes.  Sonographer:     Philomena Daring Referring Phys:  [id number] CARALYN HUDSON Diagnosing Phys: Evalene Lunger MD  Sonographer Comments: Technically difficult study due to poor echo windows. IMPRESSIONS  1. Left ventricular ejection fraction, by estimation, is 60 to 65%. The left ventricle has normal function. The left ventricle has no regional wall motion abnormalities. Left ventricular diastolic parameters are indeterminate.  2. Right ventricular systolic function is normal. The right ventricular size is normal. There is normal pulmonary artery systolic pressure. The estimated right ventricular systolic pressure is 30.0 mmHg.  3. The mitral valve is normal in structure. No evidence of mitral valve regurgitation. No evidence of mitral stenosis.  4. The aortic valve is normal in structure. Aortic valve regurgitation is not visualized. No aortic stenosis is present.  5. The inferior vena cava is normal in size with greater than 50% respiratory variability, suggesting right atrial pressure of 3 mmHg. FINDINGS  Left Ventricle: Left ventricular ejection fraction, by estimation, is 60 to 65%. The left ventricle has normal function. The left ventricle has no regional wall motion abnormalities. Definity contrast agent was given IV to delineate the left ventricular  endocardial borders. Strain was performed and the global longitudinal strain is indeterminate. The left ventricular internal cavity size was normal in size. There is no left ventricular hypertrophy. Left  ventricular diastolic parameters are indeterminate. Right Ventricle: The right ventricular size is normal. No increase in right ventricular wall thickness. Right ventricular systolic function is normal. There is normal pulmonary artery systolic pressure. The tricuspid regurgitant velocity is 2.50 m/s, and  with an assumed right atrial pressure of 5 mmHg, the estimated right ventricular systolic pressure is 30.0 mmHg. Left Atrium: Left atrial size was normal in size. Right Atrium: Right atrial size was normal in size. Pericardium: There is no evidence of pericardial effusion. Mitral Valve: The mitral valve is normal in structure. Mild mitral annular calcification. No evidence of mitral valve regurgitation. No evidence of mitral valve stenosis. Tricuspid Valve: The tricuspid valve is normal in structure. Tricuspid valve regurgitation is mild . No evidence of tricuspid stenosis. Aortic Valve: The aortic valve is normal in structure. Aortic valve regurgitation is not visualized. No aortic stenosis is present. Pulmonic Valve: The pulmonic valve was normal in structure. Pulmonic valve regurgitation is not visualized. No evidence of pulmonic stenosis. Aorta: The aortic root is normal in size and structure. Venous: The inferior vena cava is normal in size  with greater than 50% respiratory variability, suggesting right atrial pressure of 3 mmHg. IAS/Shunts: No atrial level shunt detected by color flow Doppler. Additional Comments: 3D was performed not requiring image post processing on an independent workstation and was indeterminate.  LEFT VENTRICLE PLAX 2D LVIDd:         4.80 cm   Diastology LVIDs:         3.20 cm   LV e' medial:  8.85 cm/s LV PW:         1.00 cm   LV e' lateral: 13.23 cm/s LV IVS:        1.20 cm LVOT diam:     2.10 cm LV SV:         67 LV SV Index:   33 LVOT Area:     3.46 cm  RIGHT VENTRICLE             IVC RV S prime:     10.75 cm/s  IVC diam: 2.10 cm TAPSE (M-mode): 1.6 cm LEFT ATRIUM              Index        RIGHT ATRIUM           Index LA diam:        4.00 cm 1.98 cm/m   RA Area:     16.50 cm LA Vol (A2C):   48.5 ml 24.01 ml/m  RA Volume:   35.60 ml  17.62 ml/m LA Vol (A4C):   34.2 ml 16.93 ml/m LA Biplane Vol: 41.4 ml 20.49 ml/m  AORTIC VALVE LVOT Vmax:   127.50 cm/s LVOT Vmean:  81.100 cm/s LVOT VTI:    0.192 m  AORTA Ao Root diam: 3.00 cm TRICUSPID VALVE TR Peak grad:   25.0 mmHg TR Vmax:        250.00 cm/s  SHUNTS Systemic VTI:  0.19 m Systemic Diam: 2.10 cm Evalene Lunger MD Electronically signed by Evalene Lunger MD Signature Date/Time: 06/24/2024/11:12:09 AM    Final     Scheduled Meds:  amiodarone  400 mg Oral BID   Followed by   NOREEN ON 07/05/2024] amiodarone  200 mg Oral Daily   apixaban  5 mg Oral BID   ciprofloxacin  500 mg Oral BID   insulin aspart  0-6 Units Subcutaneous TID AC & HS   insulin glargine-yfgn  10 Units Subcutaneous BID   metoprolol  tartrate  37.5 mg Oral BID   metroNIDAZOLE  500 mg Oral Q12H   pantoprazole  40 mg Oral BID AC   sodium chloride  flush  5 mL Intracatheter Q8H   Continuous Infusions:  amiodarone 30 mg/hr (06/25/24 1041)     LOS: 4 days  MDM: Patient is high risk for one or more organ failure.  They necessitate ongoing hospitalization for continued IV therapies and subsequent lab monitoring. Total time spent interpreting labs and vitals, reviewing the medical record, coordinating care amongst consultants and care team members, directly assessing and discussing care with the patient and/or family: 55 min   Merrie Epler, DO [facility]  To contact the attending physician between 7A-7P please use Epic Chat. To contact the covering physician during after hours 7P-7A, please review Amion.  06/25/2024, 1:44 PM   *This document has been created with the assistance of dictation software. Please excuse typographical errors. *

## 2024-06-25 NOTE — Plan of Care (Signed)

## 2024-06-25 NOTE — Inpatient Diabetes Management (Signed)
 Inpatient Diabetes Program Recommendations  AACE/ADA: New Consensus Statement on Inpatient Glycemic Control   Target Ranges:  Prepandial:   less than 140 mg/dL      Peak postprandial:   less than 180 mg/dL (1-2 hours)      Critically ill patients:  140 - 180 mg/dL    Latest Reference Range & Units 06/24/24 08:16 06/24/24 12:13 06/24/24 17:13 06/24/24 19:58 06/24/24 23:43 06/25/24 04:11  Glucose-Capillary 70 - 99 mg/dL 844 (H) 736 (H) 795 (H) 209 (H) 164 (H) 140 (H)   Review of Glycemic Control  Diabetes history: No Outpatient Diabetes medications: NA Current orders for Inpatient glycemic control: Semglee 10 units BID, Novolog 0-6 units Q4H  Inpatient Diabetes Program Recommendations:    Insulin: Please consider increasing Semglee to 12 units BID and consider changing CBGs to AC&HS and Novolog correction to 0-9 units TID with meals and Novolog 0-5 units at bedtime.  Discharge Recommendations: Other recommendations: FreeStyle Libre 3 Plus reader [phone]) and FreeStyle Libre 3 Plus sensors [phone]) Long acting recommendations: Insulin Glargine (LANTUS) Solostar Pen Dose to be determined  Short acting recommendations:  Correction coverage ONLY Insulin aspart (NOVOLOG) FlexPen  Sensitive Scale.   Hypoglycemia treatment recommendations: Baqsimi 3mg  Supply/Referral recommendations: Glucometer Test strips Lancet device Lancets Pen needles - standard   Use Adult Diabetes Insulin Treatment Post Discharge order set.   Thanks, Earnie Gainer, RN, MSN, CDCES Diabetes Coordinator Inpatient Diabetes Program [phone] (Team Pager from 8am to 5pm)

## 2024-06-26 ENCOUNTER — Other Ambulatory Visit: Payer: Self-pay

## 2024-06-26 DIAGNOSIS — E669 Obesity, unspecified: Secondary | ICD-10-CM | POA: Diagnosis not present

## 2024-06-26 DIAGNOSIS — K5720 Diverticulitis of large intestine with perforation and abscess without bleeding: Secondary | ICD-10-CM | POA: Diagnosis not present

## 2024-06-26 DIAGNOSIS — N179 Acute kidney failure, unspecified: Secondary | ICD-10-CM | POA: Diagnosis not present

## 2024-06-26 DIAGNOSIS — E1110 Type 2 diabetes mellitus with ketoacidosis without coma: Secondary | ICD-10-CM | POA: Diagnosis not present

## 2024-06-26 LAB — CBC
HCT: 35.4 % — ABNORMAL LOW (ref 36.0–46.0)
Hemoglobin: 11.3 g/dL — ABNORMAL LOW (ref 12.0–15.0)
MCH: 27.7 pg (ref 26.0–34.0)
MCHC: 31.9 g/dL (ref 30.0–36.0)
MCV: 86.8 fL (ref 80.0–100.0)
Platelets: 356 K/uL (ref 150–400)
RBC: 4.08 MIL/uL (ref 3.87–5.11)
RDW: 13.9 % (ref 11.5–15.5)
WBC: 10.6 K/uL — ABNORMAL HIGH (ref 4.0–10.5)
nRBC: 0 % (ref 0.0–0.2)

## 2024-06-26 LAB — BASIC METABOLIC PANEL WITH GFR
Anion gap: 11 (ref 5–15)
BUN: 15 mg/dL (ref 8–23)
CO2: 27 mmol/L (ref 22–32)
Calcium: 8.5 mg/dL — ABNORMAL LOW (ref 8.9–10.3)
Chloride: 93 mmol/L — ABNORMAL LOW (ref 98–111)
Creatinine, Ser: 0.85 mg/dL (ref 0.44–1.00)
GFR, Estimated: >60 mL/min (ref >60)
Glucose, Bld: 139 mg/dL — ABNORMAL HIGH (ref 70–99)
Potassium: 3.6 mmol/L (ref 3.5–5.1)
Sodium: 131 mmol/L — ABNORMAL LOW (ref 135–145)

## 2024-06-26 LAB — CULTURE, BLOOD (ROUTINE X 2)
Culture: NO GROWTH
Culture: NO GROWTH

## 2024-06-26 LAB — GLUCOSE, CAPILLARY
Glucose-Capillary: 77 mg/dL (ref 70–99)
Glucose-Capillary: 169 mg/dL — ABNORMAL HIGH (ref 70–99)
Glucose-Capillary: 159 mg/dL — ABNORMAL HIGH (ref 70–99)

## 2024-06-26 MED ORDER — BLOOD GLUCOSE MONITOR SYSTEM W/DEVICE KIT
1.0000 | PACK | 0 refills | Status: AC
  Filled 2024-06-26: qty 1, 1d supply, fill #0

## 2024-06-26 MED ORDER — AMIODARONE HCL 200 MG PO TABS: 200.0000 mg | ORAL_TABLET | Freq: Every day | ORAL | Status: DC

## 2024-06-26 MED ORDER — BAQSIMI TWO PACK 3 MG/DOSE NA POWD
3.0000 mg | NASAL | 0 refills | Status: AC | PRN
  Filled 2024-06-26: qty 2, 2d supply, fill #0

## 2024-06-26 MED ORDER — AMIODARONE HCL 200 MG PO TABS: 200.0000 mg | ORAL_TABLET | Freq: Two times a day (BID) | ORAL | Status: DC

## 2024-06-26 MED ORDER — BLOOD GLUCOSE TEST VI STRP
1.0000 | ORAL_STRIP | 0 refills | Status: AC
  Filled 2024-06-26: qty 100, 25d supply, fill #0

## 2024-06-26 MED ORDER — PANTOPRAZOLE SODIUM 40 MG PO TBEC
40.0000 mg | DELAYED_RELEASE_TABLET | Freq: Two times a day (BID) | ORAL | 0 refills | Status: AC
  Filled 2024-06-26: qty 28, 14d supply, fill #0

## 2024-06-26 MED ORDER — APIXABAN 5 MG PO TABS
5.0000 mg | ORAL_TABLET | Freq: Two times a day (BID) | ORAL | 2 refills | Status: AC
  Filled 2024-06-26: qty 60, 30d supply, fill #0
  Filled 2024-08-25: qty 60, 30d supply, fill #1

## 2024-06-26 MED ORDER — LANCET DEVICE MISC
1.0000 | 0 refills | Status: AC
  Filled 2024-06-26: qty 1, fill #0

## 2024-06-26 MED ORDER — METOPROLOL TARTRATE 37.5 MG PO TABS
37.5000 mg | ORAL_TABLET | Freq: Two times a day (BID) | ORAL | 0 refills | Status: DC
  Filled 2024-06-26: qty 60, 30d supply, fill #0

## 2024-06-26 MED ORDER — INSULIN GLARGINE 100 UNIT/ML SOLOSTAR PEN
15.0000 [IU] | PEN_INJECTOR | Freq: Every day | SUBCUTANEOUS | 0 refills | Status: AC
  Filled 2024-06-26: qty 15, 100d supply, fill #0

## 2024-06-26 MED ORDER — LANCETS MISC
1.0000 | 0 refills | Status: AC
  Filled 2024-06-26: qty 100, 25d supply, fill #0

## 2024-06-26 MED ORDER — INSULIN PEN NEEDLE 32G X 4 MM MISC
1.0000 | 0 refills | Status: AC
  Filled 2024-06-26: qty 100, 25d supply, fill #0

## 2024-06-26 MED ORDER — INSULIN ASPART 100 UNIT/ML FLEXPEN
0.0000 [IU] | PEN_INJECTOR | Freq: Three times a day (TID) | SUBCUTANEOUS | 0 refills | Status: AC
  Filled 2024-06-26: qty 9, 50d supply, fill #0

## 2024-06-26 MED ORDER — AMIODARONE HCL 200 MG PO TABS
ORAL_TABLET | ORAL | 0 refills | Status: AC
  Filled 2024-06-26: qty 32, 23d supply, fill #0

## 2024-06-26 NOTE — Plan of Care (Signed)
   Problem: Education: Goal: Ability to describe self-care measures that may prevent or decrease complications (Diabetes Survival Skills Education) will improve Outcome: Progressing Goal: Individualized Educational Video(s) Outcome: Progressing   Problem: Coping: Goal: Ability to adjust to condition or change in health will improve Outcome: Progressing

## 2024-06-26 NOTE — Progress Notes (Signed)
 Patient ID: Yolanda Gray, female   DOB: [birth year], 68 y.o.   MRN: [id number] [facility]    SUBJECTIVE: Patient states she feels reasonably well had not followed by primary physician or cardiology in the past she recently diagnosed with diabetes denies any palpitations or tachycardia presented with diverticulitis and sepsis with an abscess but feels much better now   Vitals:   06/25/24 1626 06/25/24 1916 06/26/24 0043 06/26/24 0412  BP: 127/67 (!) 144/74 130/66 125/88  Pulse: 88 (!) 56 (!) 106 92  Resp: 14 19 17 18   Temp: 97.7 F (36.5 C) 99.2 F (37.3 C) (!) 97.5 F (36.4 C) 98.6 F (37 C)  TempSrc:      SpO2: 97% 93% 95% 93%  Weight:      Height:         Intake/Output Summary (Last 24 hours) at 06/26/2024 9094 Last data filed at 06/26/2024 0600 Gross per 24 hour  Intake 20 ml  Output 10 ml  Net 10 ml      PHYSICAL EXAM  General: Well developed, well nourished, in no acute distress HEENT:  Normocephalic and atramatic Neck:  No JVD.  Lungs: Clear bilaterally to auscultation and percussion. Heart: HRRR . Normal S1 and S2 without gallops or murmurs.  Abdomen: Bowel sounds are positive, abdomen soft and non-tender  Msk:  Back normal, normal gait. Normal strength and tone for age. Extremities: No clubbing, cyanosis or edema.   Neuro: Alert and oriented X 3. Psych:  Good affect, responds appropriately   LABS: Basic Metabolic Panel: Recent Labs    06/24/24 0446 06/24/24 0450 06/25/24 0420 06/26/24 0438  NA  --    < > 134* 131*  K  --    < > 3.7 3.6  CL  --    < > 96* 93*  CO2  --    < > 26 27  GLUCOSE  --    < > 137* 139*  BUN  --    < > 16 15  CREATININE  --    < > 0.84 0.85  CALCIUM  --    < > 8.4* 8.5*  MG 2.1  --   --   --    < > = values in this interval not displayed.   Liver Function Tests: No results for input(s): AST, ALT, ALKPHOS, BILITOT, PROT, ALBUMIN in the last 72 hours. No results for input(s): LIPASE, AMYLASE in the  last 72 hours. CBC: Recent Labs    06/25/24 0420 06/26/24 0438  WBC 11.2* 10.6*  HGB 10.5* 11.3*  HCT 33.3* 35.4*  MCV 86.9 86.8  PLT 282 356   Cardiac Enzymes: No results for input(s): CKTOTAL, CKMB, CKMBINDEX, TROPONINI in the last 72 hours. BNP: Invalid input(s): POCBNP D-Dimer: No results for input(s): DDIMER in the last 72 hours. Hemoglobin A1C: No results for input(s): HGBA1C in the last 72 hours. Fasting Lipid Panel: No results for input(s): CHOL, HDL, LDLCALC, TRIG, CHOLHDL, LDLDIRECT in the last 72 hours. Thyroid Function Tests: No results for input(s): TSH, T4TOTAL, T3FREE, THYROIDAB in the last 72 hours.  Invalid input(s): FREET3 Anemia Panel: No results for input(s): VITAMINB12, FOLATE, FERRITIN, TIBC, IRON, RETICCTPCT in the last 72 hours.  No results found.   Echo preserved left ventricular function EF of 60 to 65%  TELEMETRY: Atrial fibrillation with RVR rate of 122:  ASSESSMENT AND PLAN:  Principal Problem:   DKA (diabetic ketoacidosis) (HCC) Active Problems:   Obesity (BMI 30-39.9)   Abscess  of sigmoid colon due to diverticulitis   Septic shock (HCC)   AKI (acute kidney injury)   T2DM (type 2 diabetes mellitus) (HCC) New onset atrial fibrillation Obesity Probable obstructive sleep apnea  Plan Agree with telemetry for atrial fibrillation continue IV amiodarone load transition to p.o. continue metoprolol  also for rate management as well as Eliquis for anticoagulation Recommend weight loss exercise portion control Consider TEE cardioversion versus 1 month of anticoagulation and cardioversion if she does not spontaneously convert Continue diabetes management and control A1c goal less than 7 Sleep study for evaluation of possible obstructive sleep apnea CPAP if indicated Broad-spectrum antibiotic therapy for abscess diverticulitis Renal insufficiency maintain adequate hydration consider follow-up with  nephrology   Cara JONETTA Lovelace, MD, 06/26/2024 9:05 AM

## 2024-06-26 NOTE — Progress Notes (Signed)
 Patient ID: Yolanda Gray, female   DOB: [birth year], 68 y.o.   MRN: [id number]     SURGICAL PROGRESS NOTE   Hospital Day(s): 5.   Interval History: Patient seen and examined, no acute events or new complaints overnight. Patient reports feeling well.  She was found sitting down in her bed looking comfortable.  She denies any abdominal pain.  She continue passing gas.  She is tolerating diet.  There is adequate, serous drain output.  Continue decreasing white cell count.  Vital signs in last 24 hours: [min-max] current  Temp:  [97.5 F (36.4 C)-99.2 F (37.3 C)] 98.6 F (37 C) (11/15 0412) Pulse Rate:  [56-106] 92 (11/15 0412) Resp:  [14-19] 18 (11/15 0412) BP: (115-144)/(66-88) 125/88 (11/15 0412) SpO2:  [93 %-97 %] 93 % (11/15 0412)     Height: 5' 3 (160 cm) Weight: 100.6 kg BMI (Calculated): 39.28   Physical Exam:  Constitutional: alert, cooperative and no distress  Respiratory: breathing non-labored at rest  Cardiovascular: regular rate and sinus rhythm  Gastrointestinal: soft, non-tender, and non-distended  Labs:     Latest Ref Rng & Units 06/26/2024    4:38 AM 06/25/2024    4:20 AM 06/24/2024    4:50 AM  CBC  WBC 4.0 - 10.5 K/uL 10.6  11.2  15.7   Hemoglobin 12.0 - 15.0 g/dL 88.6  89.4  88.9   Hematocrit 36.0 - 46.0 % 35.4  33.3  35.2   Platelets 150 - 400 K/uL 356  282  270       Latest Ref Rng & Units 06/26/2024    4:38 AM 06/25/2024    4:20 AM 06/24/2024    4:50 AM  CMP  Glucose 70 - 99 mg/dL 860  862  845   BUN 8 - 23 mg/dL 15  16  19    Creatinine 0.44 - 1.00 mg/dL 9.14  9.15  9.03   Sodium 135 - 145 mmol/L 131  134  133   Potassium 3.5 - 5.1 mmol/L 3.6  3.7  4.2   Chloride 98 - 111 mmol/L 93  96  96   CO2 22 - 32 mmol/L 27  26  27    Calcium 8.9 - 10.3 mg/dL 8.5  8.4  8.5     Imaging studies: No new pertinent imaging studies   Assessment/Plan:  Complicated diverticulitis with abscess - S/p percutaneous drainage - No acute abdomen - May transition to  oral antibiotic therapy - Tolerating soft diet - No surgical intervention indicated at this moment. -Recommend to follow-up with CT scan and sinus drain check in around 2 weeks for evaluation of healing of the abscess before removing the drain.   New onset atrial fibrillation - Currently on amiodarone, metoprolol  and anticoagulation - Follow-up cardiology recommendations   Newly diagnosed diabetes - Slowly improving - I discussed with patient about importance of controlling diabetes before discussing surgical intervention. - Diabetes delayed healing so doing partial colectomy with anastomosis is high risk on uncontrolled diabetic.  High risk for stoma creation.  Lucas Petrin, MD

## 2024-06-26 NOTE — Discharge Summary (Signed)
 DISCHARGE SUMMARY    Yolanda Gray FMW:[id number] DOB: [birth year] DOA: 06/21/2024  PCP: Pcp, No  Admit date: 06/21/2024 Discharge date: 06/26/2024   Recommendations for Outpatient Follow-up:  Follow up with PCP in 1-2 weeks to establish care, pursue chronic diabetes management Follow-up in general surgery in 2 weeks Follow-up interventional radiology in 2 weeks Follow-up with cardiology in the office for continued A-fib management  Hospital Course: Yolanda Gray is a 68 year old female with obesity, COPD not on O2, active tobacco abuse, polycythemia, does not consistently see a physician, presented to the ED complaining of right lower quadrant abdominal pain which had been intermittent for 2 months prior to arrival.  Patient was found to be septic and in DKA with a new diabetes diagnosis hemoglobin A1c 10.7%.  She was also found to have acute sigmoid diverticulitis with an abscess 5.3 x 4 cm in the right hemipelvis.  She underwent IR drain placement on 11/11 and initiated on Zosyn. Patient was improving but late in the evening on 11/11 she went into A-fib RVR.  She was given oral metoprolol  and started on argatroban drip.  Echocardiogram was ordered.  She became hypotensive after metoprolol .  She was started on digoxin, blood pressure stabilized.  She was later transitioned to p.o. amiodarone. Medications were slowly titrated until 11/15, at which time rate was consistently between 90 and 110 and cardiology cleared patient for discharge with close outpatient follow-up.    A-fib RVR - Began 11/11, likely provoked by sepsis.  But has been difficult to control. - Initially required argatroban drip.  Transitioned to Eliquis without issue - IV Amio to p.o. - Cardiology provided 30-day free sample Eliquis - Echo: LVEF 60 to 65%, no RWMA, diastolic parameters indeterminate.  No significant valvular dysfunction noted - Cardiology cleared patient for discharge as pulse has been consistently under  110.  Has close outpatient follow-up already arranged   Sepsis, severe - On arrival tachycardic, hypotensive, elevated lactic, briefly required pressors. - Treatment for acute conditions as below   Acute sigmoid diverticulitis with abscess - 5.3 x 4 cm abscess in the right hemipelvis - 11/11 IR drain placement.  Drain output has been very minimal. -Cultures with some E. Coli bacteria avoid these - WBC resolving on Cipro Flagyl.  Discharge with extended 14-day course - Planning to discharge with drain and follow-up in surgery clinic in 2 weeks.  General surgery considering sigmoidectomy with primary anastomosis in 3 months - IR providing family teaching on drain management and has provided supplies prior to DC - No plans for repeat imaging during this admission - Blood cultures remain negative - Tolerating diet  DKA Type 2 diabetes - New Diabetes diagnosis.  Hemoglobin A1c 10.7% -- Basal/Bolus and all related supplies have been provided via meds to beds prior to DC - Diabetes educator has been consulted for patient education   AKI - Has resolved with IV fluids - Monitor kidney function closely   BMI 39 Obesity class II - Outpatient follow up for lifestyle modification and risk factor management  Discharge Instructions  Discharge Instructions     Amb Referral to Nutrition and Diabetic Education   Complete by: As directed    Call MD for:  difficulty breathing, headache or visual disturbances   Complete by: As directed    Call MD for:  persistant dizziness or light-headedness   Complete by: As directed    Call MD for:  persistant nausea and vomiting   Complete by: As directed  Call MD for:  severe uncontrolled pain   Complete by: As directed    Call MD for:  temperature >100.4   Complete by: As directed    Diet Carb Modified   Complete by: As directed    Discharge instructions   Complete by: As directed    Please take your blood pressure once a day and keep a log.    For Blood Pressure Monitor home BP readings. Goal: 140/90 or less Bring all BP readings with you to your office visits Bring your home BP cuff with you to your office visits  Please also test your blood glucose 3 times a day and follow the following sliding scale. Inject 0-6 Units into the skin 3 (three) times daily with meals.  Check Blood Glucose (BG) and inject per scale:  BG <150= 0 unit;  BG 150-200= 1 unit;  BG 201-250= 2 unit;  BG 251-300= 3 unit;  BG 301-350= 4 unit;  BG 351-400= 5 unit;  BG >400= 6 unit and Call Primary Care.  See your primary care doctor in one week to review this log and make further medication changes.    Follow-up with cardiology, general surgery, interventional radiology as directed   Increase activity slowly   Complete by: As directed    Leave dressing on - Keep it clean, dry, and intact until clinic visit   Complete by: As directed       Allergies as of 06/26/2024       Reactions   Porcine (pork) Protein-containing Drug Products         Medication List     TAKE these medications    amiodarone 200 MG tablet Commonly known as: PACERONE Take 1 tablet (200 mg total) by mouth 2 (two) times daily for 9 days, THEN 1 tablet (200 mg total) daily for 14 days. Start taking on: June 26, 2024   apixaban 5 MG Tabs tablet Commonly known as: ELIQUIS Take 1 tablet (5 mg total) by mouth 2 (two) times daily.   Baqsimi Two Pack 3 MG/DOSE Powd Generic drug: Glucagon Place 3 mg into the nose as needed for up to 2 doses (Severe low blood sugar). Give 3 mg (one actuation) into a single nostril.   Blood Glucose Monitoring Suppl Devi 1 each by Does not apply route as directed. Dispense based on patient and insurance preference. Use up to four times daily as directed. (FOR ICD-10 E10.9, E11.9).   BLOOD GLUCOSE TEST STRIPS Strp 1 each by Does not apply route as directed. Dispense based on patient and insurance preference. Use up to four times  daily as directed. (FOR ICD-10 E10.9, E11.9).   ciprofloxacin 500 MG tablet Commonly known as: Cipro Take 1 tablet (500 mg total) by mouth 2 (two) times daily for 14 days.   insulin aspart 100 UNIT/ML FlexPen Commonly known as: NOVOLOG Inject 0-6 Units into the skin 3 (three) times daily with meals. Check Blood Glucose (BG) and inject per scale: BG <150= 0 unit; BG 150-200= 1 unit; BG 201-250= 2 unit; BG 251-300= 3 unit; BG 301-350= 4 unit; BG 351-400= 5 unit; BG >400= 6 unit and Call Primary Care.   insulin glargine 100 UNIT/ML Solostar Pen Commonly known as: LANTUS Inject 15 Units into the skin daily. May substitute as needed per insurance.   Lancet Device Misc 1 each by Does not apply route as directed. Dispense based on patient and insurance preference. Use up to four times daily as directed. (FOR ICD-10 E10.9,  E11.9).   Lancets Misc 1 each by Does not apply route as directed. Dispense based on patient and insurance preference. Use up to four times daily as directed. (FOR ICD-10 E10.9, E11.9).   Metoprolol  Tartrate 37.5 MG Tabs Take 1 tablet (37.5 mg total) by mouth 2 (two) times daily.   metroNIDAZOLE 500 MG tablet Commonly known as: FLAGYL Take 1 tablet (500 mg total) by mouth 3 (three) times daily for 14 days.   pantoprazole 40 MG tablet Commonly known as: PROTONIX Take 1 tablet (40 mg total) by mouth 2 (two) times daily before a meal for 14 days.   Pen Needles 31G X 5 MM Misc 1 each by Does not apply route as directed. Dispense based on patient and insurance preference. Use up to four times daily as directed. (FOR ICD-10 E10.9, E11.9).   sodium chloride  flush 0.9 % Soln injection Use 10 mLs by Intracatheter route daily.               Discharge Care Instructions  (From admission, onward)           Start     Ordered   06/26/24 0000  Leave dressing on - Keep it clean, dry, and intact until clinic visit        06/26/24 1515            Follow-up  Information     Rodolph Romano, MD Follow up.   Specialty: General Surgery Why: 2 weeks s/p drain placement for complicated diverticulitis Contact information: [address] [place] [address] [phone]                Allergies  Allergen Reactions   Porcine (Pork) Protein-Containing Drug Products     Consultations: Treatment Team:  Rodolph Romano, MD [facility], MD Ammon Blunt, MD   Procedures/Studies: ECHOCARDIOGRAM COMPLETE Result Date: 06/24/2024    ECHOCARDIOGRAM REPORT   Patient Name:   SATIA WINGER Date of Exam: 06/24/2024 Medical Rec #:  [id number]    Height:       63.0 in Accession #:    [id number]   Weight:       221.7 lb Date of Birth:  1955-09-20   BSA:          2.020 m Patient Age:    67 years     BP:           146/107 mmHg Patient Gender: F            HR:           136 bpm. Exam Location:  [facility] Procedure: 2D Echo, Cardiac Doppler, Color Doppler and Intracardiac            Opacification Agent (Both Spectral and Color Flow Doppler were            utilized during procedure). Indications:     Atrial fibrillation  History:         Patient has no prior history of Echocardiogram examinations.                  COPD; Risk Factors:Diabetes.  Sonographer:     Philomena Daring Referring Phys:  [id number] CARALYN HUDSON Diagnosing Phys: Evalene Lunger MD  Sonographer Comments: Technically difficult study due to poor echo windows. IMPRESSIONS  1. Left ventricular ejection fraction, by estimation, is 60 to 65%. The left ventricle has normal function. The left ventricle has no regional wall motion abnormalities. Left ventricular diastolic parameters are indeterminate.  2. Right ventricular systolic function is normal. The right ventricular size is normal. There is normal pulmonary artery systolic pressure. The estimated right ventricular systolic pressure is 30.0 mmHg.  3. The mitral valve is normal in structure. No evidence of mitral  valve regurgitation. No evidence of mitral stenosis.  4. The aortic valve is normal in structure. Aortic valve regurgitation is not visualized. No aortic stenosis is present.  5. The inferior vena cava is normal in size with greater than 50% respiratory variability, suggesting right atrial pressure of 3 mmHg. FINDINGS  Left Ventricle: Left ventricular ejection fraction, by estimation, is 60 to 65%. The left ventricle has normal function. The left ventricle has no regional wall motion abnormalities. Definity contrast agent was given IV to delineate the left ventricular  endocardial borders. Strain was performed and the global longitudinal strain is indeterminate. The left ventricular internal cavity size was normal in size. There is no left ventricular hypertrophy. Left ventricular diastolic parameters are indeterminate. Right Ventricle: The right ventricular size is normal. No increase in right ventricular wall thickness. Right ventricular systolic function is normal. There is normal pulmonary artery systolic pressure. The tricuspid regurgitant velocity is 2.50 m/s, and  with an assumed right atrial pressure of 5 mmHg, the estimated right ventricular systolic pressure is 30.0 mmHg. Left Atrium: Left atrial size was normal in size. Right Atrium: Right atrial size was normal in size. Pericardium: There is no evidence of pericardial effusion. Mitral Valve: The mitral valve is normal in structure. Mild mitral annular calcification. No evidence of mitral valve regurgitation. No evidence of mitral valve stenosis. Tricuspid Valve: The tricuspid valve is normal in structure. Tricuspid valve regurgitation is mild . No evidence of tricuspid stenosis. Aortic Valve: The aortic valve is normal in structure. Aortic valve regurgitation is not visualized. No aortic stenosis is present. Pulmonic Valve: The pulmonic valve was normal in structure. Pulmonic valve regurgitation is not visualized. No evidence of pulmonic stenosis. Aorta:  The aortic root is normal in size and structure. Venous: The inferior vena cava is normal in size with greater than 50% respiratory variability, suggesting right atrial pressure of 3 mmHg. IAS/Shunts: No atrial level shunt detected by color flow Doppler. Additional Comments: 3D was performed not requiring image post processing on an independent workstation and was indeterminate.  LEFT VENTRICLE PLAX 2D LVIDd:         4.80 cm   Diastology LVIDs:         3.20 cm   LV e' medial:  8.85 cm/s LV PW:         1.00 cm   LV e' lateral: 13.23 cm/s LV IVS:        1.20 cm LVOT diam:     2.10 cm LV SV:         67 LV SV Index:   33 LVOT Area:     3.46 cm  RIGHT VENTRICLE             IVC RV S prime:     10.75 cm/s  IVC diam: 2.10 cm TAPSE (M-mode): 1.6 cm LEFT ATRIUM             Index        RIGHT ATRIUM           Index LA diam:        4.00 cm 1.98 cm/m   RA Area:     16.50 cm LA Vol (A2C):   48.5 ml 24.01 ml/m  RA Volume:  35.60 ml  17.62 ml/m LA Vol (A4C):   34.2 ml 16.93 ml/m LA Biplane Vol: 41.4 ml 20.49 ml/m  AORTIC VALVE LVOT Vmax:   127.50 cm/s LVOT Vmean:  81.100 cm/s LVOT VTI:    0.192 m  AORTA Ao Root diam: 3.00 cm TRICUSPID VALVE TR Peak grad:   25.0 mmHg TR Vmax:        250.00 cm/s  SHUNTS Systemic VTI:  0.19 m Systemic Diam: 2.10 cm Evalene Lunger MD Electronically signed by Evalene Lunger MD Signature Date/Time: 06/24/2024/11:12:09 AM    Final    CT GUIDED PERITONEAL/RETROPERITONEAL FLUID DRAIN BY PERC CATH Result Date: 06/22/2024 INDICATION: Right lower quadrant diverticular abscess EXAM: CT DRAINAGE OF THE RIGHT LOWER QUADRANT ABSCESS MEDICATIONS: The patient is currently admitted to the hospital and receiving intravenous antibiotics. The antibiotics were administered within an appropriate time frame prior to the initiation of the procedure. ANESTHESIA/SEDATION: Moderate (conscious) sedation was employed during this procedure. A total of Versed  2 mg and Fentanyl  50 mcg was administered intravenously by  the radiology nurse. Total intra-service moderate Sedation Time: 12 minutes. The patient's level of consciousness and vital signs were monitored continuously by radiology nursing throughout the procedure under my direct supervision. COMPLICATIONS: None immediate. PROCEDURE: Informed written consent was obtained from the patient after a thorough discussion of the procedural risks, benefits and alternatives. All questions were addressed. Maximal Sterile Barrier Technique was utilized including caps, mask, sterile gowns, sterile gloves, sterile drape, hand hygiene and skin antiseptic. A timeout was performed prior to the initiation of the procedure. Previous imaging reviewed. Patient positioned supine. Noncontrast localization CT performed. The right lower quadrant diverticular abscess was localized and marked. Under sterile conditions local anesthesia, the 18 gauge 15 cm access needle was advanced from a right lower quadrant anterior oblique approach into the abscess. Needle position confirmed with CT. Syringe aspiration yielded purulent fluid. Sample sent for culture. Guidewire inserted followed by tract dilatation to insert a 10 French drain. Drain catheter position confirmed with CT. Syringe five cc blood tinged purulent fluid. Catheter secured with a silk suture and sterile dressing. Suction bulb applied. No immediate complication. Patient tolerated the procedure well. IMPRESSION: Successful CT-guided right lower quadrant diverticular abscess drain placement. Electronically Signed   By: CHRISTELLA.  Shick M.D.   On: 06/22/2024 13:20   DG Chest Port 1 View Result Date: 06/21/2024 CLINICAL DATA:  Fever. EXAM: PORTABLE CHEST 1 VIEW COMPARISON:  Chest radiograph dated 09/22/2021. FINDINGS: There is cardiomegaly with central vascular congestion. No focal consolidation, pleural effusion, pneumothorax. No acute osseous pathology. IMPRESSION: Cardiomegaly with central vascular congestion. No focal consolidation. Electronically  Signed   By: Vanetta Chou M.D.   On: 06/21/2024 13:28   CT ABDOMEN PELVIS W CONTRAST Result Date: 06/21/2024 CLINICAL DATA:  Right lower quadrant abdominal pain. EXAM: CT ABDOMEN AND PELVIS WITH CONTRAST TECHNIQUE: Multidetector CT imaging of the abdomen and pelvis was performed using the standard protocol following bolus administration of intravenous contrast. RADIATION DOSE REDUCTION: This exam was performed according to the departmental dose-optimization program which includes automated exposure control, adjustment of the mA and/or kV according to patient size and/or use of iterative reconstruction technique. CONTRAST:  80mL OMNIPAQUE IOHEXOL 300 MG/ML  SOLN COMPARISON:  None Available. FINDINGS: Lower chest: No acute abnormality. No intra-abdominal free air.  Small free fluid in the pelvis. Hepatobiliary: Fatty appearing liver. No biliary ductal dilatation. The gallbladder is unremarkable. Pancreas: Unremarkable. No pancreatic ductal dilatation or surrounding inflammatory changes. Spleen: Normal in size without  focal abnormality. Adrenals/Urinary Tract: The right adrenal glands unremarkable. There is a 1.5 cm indeterminate left adrenal nodule. There is no hydronephrosis on either side. The visualized ureters and urinary bladder appear unremarkable. Stomach/Bowel: There is severe sigmoid diverticulosis. There is active inflammatory changes consistent with acute diverticulitis. There is a 5.3 x 4.0 cm complex collection in the right hemipelvis medial to the right ovary containing fluid and air consistent with diverticular abscess. There is a linear tract extending from the distal sigmoid colon to a segment of the sigmoid colon more proximally (series 2 images 56-65) consistent with a fistula. Mildly dilated bowel loop in the left hemiabdomen measures 3.5 cm, likely a reactive ileus. Developing obstruction is less likely but not excluded. The appendix is not visualized with certainty. No inflammatory  changes identified in the right lower quadrant. Vascular/Lymphatic: Moderate aortoiliac atherosclerotic disease. The IVC is unremarkable. No portal venous gas. There is no adenopathy. Reproductive: The uterus is anteverted. Uterine fibroids noted. No suspicious adnexal masses. Other: None Musculoskeletal: Degenerative changes of the spine. No acute osseous pathology. IMPRESSION: 1. Acute sigmoid diverticulitis with a 5.3 x 4.0 cm diverticular abscess in the right hemipelvis. 2. Sigmoid-sigmoid fistula. 3. Mildly dilated bowel loop in the left hemiabdomen, likely a reactive ileus. Developing obstruction is less likely but not excluded. 4. Fatty liver. 5. Uterine fibroids. 6.  Aortic Atherosclerosis (ICD10-I70.0). Electronically Signed   By: Vanetta Chou M.D.   On: 06/21/2024 13:12      Discharge Exam: Vitals:   06/26/24 0923 06/26/24 1243  BP: 135/65 128/86  Pulse: (!) 109 82  Resp: 18 18  Temp: 98.2 F (36.8 C) (!) 97.4 F (36.3 C)  SpO2: 97% 96%   Vitals:   06/26/24 0043 06/26/24 0412 06/26/24 0923 06/26/24 1243  BP: 130/66 125/88 135/65 128/86  Pulse: (!) 106 92 (!) 109 82  Resp: 17 18 18 18   Temp: (!) 97.5 F (36.4 C) 98.6 F (37 C) 98.2 F (36.8 C) (!) 97.4 F (36.3 C)  TempSrc:   Oral Oral  SpO2: 95% 93% 97% 96%  Weight:      Height:        Constitutional:  Normal appearance. Non toxic-appearing.  HENT: Head Normocephalic and atraumatic.  Mucous membranes are moist.  Eyes:  Extraocular intact. Conjunctivae normal.  Cardiovascular: Rapid irregular rhythm Pulmonary: Non labored, symmetric rise of chest wall. Abdomen: nontender to palpation, surgical drain in place with light yellow serous fluid. Skin: warm and dry. not jaundiced.  Neurological: No focal deficit present. alert. Oriented.  Psychiatric: Mood and Affect congruent.    The results of significant diagnostics from this hospitalization (including imaging, microbiology, ancillary and laboratory) are listed  below for reference.     Microbiology: Recent Results (from the past 240 hours)  Resp panel by RT-PCR (RSV, Flu A&B, Covid) Anterior Nasal Swab     Status: None   Collection Time: 06/21/24 10:18 AM   Specimen: Anterior Nasal Swab  Result Value Ref Range Status   SARS Coronavirus 2 by RT PCR NEGATIVE NEGATIVE Final    Comment: (NOTE) SARS-CoV-2 target nucleic acids are NOT DETECTED.  The SARS-CoV-2 RNA is generally detectable in upper respiratory specimens during the acute phase of infection. The lowest concentration of SARS-CoV-2 viral copies this assay can detect is 138 copies/mL. A negative result does not preclude SARS-Cov-2 infection and should not be used as the sole basis for treatment or other patient management decisions. A negative result may occur with  improper specimen  collection/handling, submission of specimen other than nasopharyngeal swab, presence of viral mutation(s) within the areas targeted by this assay, and inadequate number of viral copies(<138 copies/mL). A negative result must be combined with clinical observations, patient history, and epidemiological information. The expected result is Negative.  Fact Sheet for Patients:  bloggercourse.com  Fact Sheet for Healthcare Providers:  seriousbroker.it  This test is no t yet approved or cleared by the [place]  FDA and  has been authorized for detection and/or diagnosis of SARS-CoV-2 by FDA under an Emergency Use Authorization (EUA). This EUA will remain  in effect (meaning this test can be used) for the duration of the COVID-19 declaration under Section 564(b)(1) of the Act, 21 U.S.C.section 360bbb-3(b)(1), unless the authorization is terminated  or revoked sooner.       Influenza A by PCR NEGATIVE NEGATIVE Final   Influenza B by PCR NEGATIVE NEGATIVE Final    Comment: (NOTE) The Xpert Xpress SARS-CoV-2/FLU/RSV plus assay is intended as an aid in  the diagnosis of influenza from Nasopharyngeal swab specimens and should not be used as a sole basis for treatment. Nasal washings and aspirates are unacceptable for Xpert Xpress SARS-CoV-2/FLU/RSV testing.  Fact Sheet for Patients: bloggercourse.com  Fact Sheet for Healthcare Providers: seriousbroker.it  This test is not yet approved or cleared by the [place]  FDA and has been authorized for detection and/or diagnosis of SARS-CoV-2 by FDA under an Emergency Use Authorization (EUA). This EUA will remain in effect (meaning this test can be used) for the duration of the COVID-19 declaration under Section 564(b)(1) of the Act, 21 U.S.C. section 360bbb-3(b)(1), unless the authorization is terminated or revoked.     Resp Syncytial Virus by PCR NEGATIVE NEGATIVE Final    Comment: (NOTE) Fact Sheet for Patients: bloggercourse.com  Fact Sheet for Healthcare Providers: seriousbroker.it  This test is not yet approved or cleared by the [place]  FDA and has been authorized for detection and/or diagnosis of SARS-CoV-2 by FDA under an Emergency Use Authorization (EUA). This EUA will remain in effect (meaning this test can be used) for the duration of the COVID-19 declaration under Section 564(b)(1) of the Act, 21 U.S.C. section 360bbb-3(b)(1), unless the authorization is terminated or revoked.  Performed at [facility], [address] [place], [place] [address]   Culture, blood (Routine X 2) w Reflex to ID Panel     Status: None   Collection Time: 06/21/24  2:00 PM   Specimen: BLOOD  Result Value Ref Range Status   Specimen Description BLOOD BLOOD RIGHT HAND  Final   Special Requests   Final    BOTTLES DRAWN AEROBIC AND ANAEROBIC Blood Culture results may not be optimal due to an inadequate volume of blood received in culture bottles   Culture   Final    NO GROWTH 5  DAYS Performed at [facility], [address] [place], [place] [address]    Report Status 06/26/2024 FINAL  Final  Culture, blood (Routine X 2) w Reflex to ID Panel     Status: None   Collection Time: 06/21/24  2:00 PM   Specimen: BLOOD  Result Value Ref Range Status   Specimen Description BLOOD RIGHT ANTECUBITAL  Final   Special Requests   Final    BOTTLES DRAWN AEROBIC AND ANAEROBIC Blood Culture results may not be optimal due to an inadequate volume of blood received in culture bottles   Culture   Final    NO GROWTH 5 DAYS Performed at [facility]  [facility], [address], [place], [place] [address]    Report Status 06/26/2024 FINAL  Final  Aerobic/Anaerobic Culture w Gram Stain (surgical/deep wound)     Status: None   Collection Time: 06/22/24 12:52 PM   Specimen: Abdomen; Abscess  Result Value Ref Range Status   Specimen Description   Final    ABDOMEN Performed at [facility], [address], [place], [place] [address]    Special Requests   Final    NONE Performed at [facility] [facility], [address] [place], [place] [address]    Gram Stain   Final    RARE WBC PRESENT, PREDOMINANTLY PMN FEW GRAM POSITIVE COCCI FEW GRAM NEGATIVE RODS    Culture   Final    Two isolates with different morphologies were identified as the same organism.The most resistant organism was reported. MODERATE ESCHERICHIA COLI ABUNDANT BACTEROIDES FRAGILIS BETA LACTAMASE NEGATIVE Performed at [facility], [address] [place], [place] [address]    Report Status 06/25/2024 FINAL  Final   Organism ID, Bacteria ESCHERICHIA COLI  Final      Susceptibility   Escherichia coli - MIC*    AMPICILLIN >=32 RESISTANT Resistant     CEFAZOLIN (NON-URINE) 8 RESISTANT Resistant     CEFEPIME <=0.12 SENSITIVE Sensitive     ERTAPENEM <=0.12 SENSITIVE Sensitive     CEFTRIAXONE <=0.25 SENSITIVE Sensitive     CIPROFLOXACIN <=0.06 SENSITIVE Sensitive     GENTAMICIN >=16  RESISTANT Resistant     MEROPENEM <=0.25 SENSITIVE Sensitive     TRIMETH/SULFA >=320 RESISTANT Resistant     AMPICILLIN/SULBACTAM 16 INTERMEDIATE Intermediate     PIP/TAZO Value in next row Sensitive      <=4 SENSITIVEThis is a modified FDA-approved test that has been validated and its performance characteristics determined by the reporting laboratory.  This laboratory is certified under the Clinical Laboratory Improvement Amendments CLIA as qualified to perform high complexity clinical laboratory testing.    * MODERATE ESCHERICHIA COLI     Labs: BNP (last 3 results) No results for input(s): BNP in the last 8760 hours. Basic Metabolic Panel: Recent Labs  Lab 06/22/24 0338 06/23/24 0137 06/24/24 0446 06/24/24 0450 06/25/24 0420 06/26/24 0438  NA 132* 135  --  133* 134* 131*  K 4.2 4.1  --  4.2 3.7 3.6  CL 98 98  --  96* 96* 93*  CO2 23 26  --  27 26 27   GLUCOSE 170* 173*  --  154* 137* 139*  BUN 22 17  --  19 16 15   CREATININE 0.97 0.87  --  0.96 0.84 0.85  CALCIUM 8.2* 8.5*  --  8.5* 8.4* 8.5*  MG  --   --  2.1  --   --   --    Liver Function Tests: Recent Labs  Lab 06/21/24 1017 06/22/24 0338 06/22/24 2006  AST 40 23 24  ALT 19 16 17   ALKPHOS 85 53 81  BILITOT 1.1 0.9 0.5  PROT 9.1* 7.0 7.1  ALBUMIN 3.5 2.9* 3.3*   Recent Labs  Lab 06/21/24 1017  LIPASE 21   No results for input(s): AMMONIA in the last 168 hours. CBC: Recent Labs  Lab 06/22/24 0338 06/23/24 0137 06/24/24 0450 06/25/24 0420 06/26/24 0438  WBC 10.1 14.3* 15.7* 11.2* 10.6*  HGB 10.5* 11.2* 11.0* 10.5* 11.3*  HCT 33.2* 35.6* 35.2* 33.3* 35.4*  MCV 86.9 88.3 88.9 86.9 86.8  PLT 221 267 270 282 356   Cardiac  Enzymes: No results for input(s): CKTOTAL, CKMB, CKMBINDEX, TROPONINI in the last 168 hours. BNP: Invalid input(s): POCBNP CBG: Recent Labs  Lab 06/25/24 1624 06/25/24 2024 06/26/24 0430 06/26/24 0925 06/26/24 1245  GLUCAP 164* 210* 77 169* 159*   D-Dimer No  results for input(s): DDIMER in the last 72 hours. Hgb A1c No results for input(s): HGBA1C in the last 72 hours. Lipid Profile No results for input(s): CHOL, HDL, LDLCALC, TRIG, CHOLHDL, LDLDIRECT in the last 72 hours. Thyroid function studies No results for input(s): TSH, T4TOTAL, T3FREE, THYROIDAB in the last 72 hours.  Invalid input(s): FREET3 Anemia work up No results for input(s): VITAMINB12, FOLATE, FERRITIN, TIBC, IRON, RETICCTPCT in the last 72 hours. Urinalysis    Component Value Date/Time   COLORURINE AMBER (A) 06/21/2024 1702   COLORURINE AMBER (A) 06/21/2024 1702   APPEARANCEUR CLOUDY (A) 06/21/2024 1702   APPEARANCEUR CLOUDY (A) 06/21/2024 1702   LABSPEC >1.046 (H) 06/21/2024 1702   LABSPEC >1.046 (H) 06/21/2024 1702   PHURINE 5.0 06/21/2024 1702   PHURINE 5.0 06/21/2024 1702   GLUCOSEU >=500 (A) 06/21/2024 1702   GLUCOSEU >=500 (A) 06/21/2024 1702   HGBUR NEGATIVE 06/21/2024 1702   HGBUR NEGATIVE 06/21/2024 1702   BILIRUBINUR NEGATIVE 06/21/2024 1702   BILIRUBINUR NEGATIVE 06/21/2024 1702   KETONESUR NEGATIVE 06/21/2024 1702   KETONESUR NEGATIVE 06/21/2024 1702   PROTEINUR >=300 (A) 06/21/2024 1702   PROTEINUR >=300 (A) 06/21/2024 1702   NITRITE NEGATIVE 06/21/2024 1702   NITRITE NEGATIVE 06/21/2024 1702   LEUKOCYTESUR MODERATE (A) 06/21/2024 1702   LEUKOCYTESUR SMALL (A) 06/21/2024 1702   Sepsis Labs Recent Labs  Lab 06/23/24 0137 06/24/24 0450 06/25/24 0420 06/26/24 0438  WBC 14.3* 15.7* 11.2* 10.6*   Microbiology Recent Results (from the past 240 hours)  Resp panel by RT-PCR (RSV, Flu A&B, Covid) Anterior Nasal Swab     Status: None   Collection Time: 06/21/24 10:18 AM   Specimen: Anterior Nasal Swab  Result Value Ref Range Status   SARS Coronavirus 2 by RT PCR NEGATIVE NEGATIVE Final    Comment: (NOTE) SARS-CoV-2 target nucleic acids are NOT DETECTED.  The SARS-CoV-2 RNA is generally detectable in upper  respiratory specimens during the acute phase of infection. The lowest concentration of SARS-CoV-2 viral copies this assay can detect is 138 copies/mL. A negative result does not preclude SARS-Cov-2 infection and should not be used as the sole basis for treatment or other patient management decisions. A negative result may occur with  improper specimen collection/handling, submission of specimen other than nasopharyngeal swab, presence of viral mutation(s) within the areas targeted by this assay, and inadequate number of viral copies(<138 copies/mL). A negative result must be combined with clinical observations, patient history, and epidemiological information. The expected result is Negative.  Fact Sheet for Patients:  bloggercourse.com  Fact Sheet for Healthcare Providers:  seriousbroker.it  This test is no t yet approved or cleared by the [place]  FDA and  has been authorized for detection and/or diagnosis of SARS-CoV-2 by FDA under an Emergency Use Authorization (EUA). This EUA will remain  in effect (meaning this test can be used) for the duration of the COVID-19 declaration under Section 564(b)(1) of the Act, 21 U.S.C.section 360bbb-3(b)(1), unless the authorization is terminated  or revoked sooner.       Influenza A by PCR NEGATIVE NEGATIVE Final   Influenza B by PCR NEGATIVE NEGATIVE Final    Comment: (NOTE) The Xpert Xpress SARS-CoV-2/FLU/RSV plus assay is intended as an aid in  the diagnosis of influenza from Nasopharyngeal swab specimens and should not be used as a sole basis for treatment. Nasal washings and aspirates are unacceptable for Xpert Xpress SARS-CoV-2/FLU/RSV testing.  Fact Sheet for Patients: bloggercourse.com  Fact Sheet for Healthcare Providers: seriousbroker.it  This test is not yet approved or cleared by the [place]  FDA and has been  authorized for detection and/or diagnosis of SARS-CoV-2 by FDA under an Emergency Use Authorization (EUA). This EUA will remain in effect (meaning this test can be used) for the duration of the COVID-19 declaration under Section 564(b)(1) of the Act, 21 U.S.C. section 360bbb-3(b)(1), unless the authorization is terminated or revoked.     Resp Syncytial Virus by PCR NEGATIVE NEGATIVE Final    Comment: (NOTE) Fact Sheet for Patients: bloggercourse.com  Fact Sheet for Healthcare Providers: seriousbroker.it  This test is not yet approved or cleared by the [place]  FDA and has been authorized for detection and/or diagnosis of SARS-CoV-2 by FDA under an Emergency Use Authorization (EUA). This EUA will remain in effect (meaning this test can be used) for the duration of the COVID-19 declaration under Section 564(b)(1) of the Act, 21 U.S.C. section 360bbb-3(b)(1), unless the authorization is terminated or revoked.  Performed at [facility], [address] [place], [place] [address]   Culture, blood (Routine X 2) w Reflex to ID Panel     Status: None   Collection Time: 06/21/24  2:00 PM   Specimen: BLOOD  Result Value Ref Range Status   Specimen Description BLOOD BLOOD RIGHT HAND  Final   Special Requests   Final    BOTTLES DRAWN AEROBIC AND ANAEROBIC Blood Culture results may not be optimal due to an inadequate volume of blood received in culture bottles   Culture   Final    NO GROWTH 5 DAYS Performed at [facility], [address] [place], [place] [address]    Report Status 06/26/2024 FINAL  Final  Culture, blood (Routine X 2) w Reflex to ID Panel     Status: None   Collection Time: 06/21/24  2:00 PM   Specimen: BLOOD  Result Value Ref Range Status   Specimen Description BLOOD RIGHT ANTECUBITAL  Final   Special Requests   Final    BOTTLES DRAWN AEROBIC AND ANAEROBIC Blood Culture results may not be  optimal due to an inadequate volume of blood received in culture bottles   Culture   Final    NO GROWTH 5 DAYS Performed at [facility], [address] [place], [place] [address]    Report Status 06/26/2024 FINAL  Final  Aerobic/Anaerobic Culture w Gram Stain (surgical/deep wound)     Status: None   Collection Time: 06/22/24 12:52 PM   Specimen: Abdomen; Abscess  Result Value Ref Range Status   Specimen Description   Final    ABDOMEN Performed at [facility], [address], [place], [place] [address]    Special Requests   Final    NONE Performed at [facility], [address] [place], [place] [address]    Gram Stain   Final    RARE WBC PRESENT, PREDOMINANTLY PMN FEW GRAM POSITIVE COCCI FEW GRAM NEGATIVE RODS    Culture   Final    Two isolates with different morphologies were identified as the same organism.The most resistant organism was reported. MODERATE ESCHERICHIA COLI ABUNDANT BACTEROIDES FRAGILIS BETA LACTAMASE NEGATIVE Performed at [facility], [address], [place], [place] [address]  Report Status 06/25/2024 FINAL  Final   Organism ID, Bacteria ESCHERICHIA COLI  Final      Susceptibility   Escherichia coli - MIC*    AMPICILLIN >=32 RESISTANT Resistant     CEFAZOLIN (NON-URINE) 8 RESISTANT Resistant     CEFEPIME <=0.12 SENSITIVE Sensitive     ERTAPENEM <=0.12 SENSITIVE Sensitive     CEFTRIAXONE <=0.25 SENSITIVE Sensitive     CIPROFLOXACIN <=0.06 SENSITIVE Sensitive     GENTAMICIN >=16 RESISTANT Resistant     MEROPENEM <=0.25 SENSITIVE Sensitive     TRIMETH/SULFA >=320 RESISTANT Resistant     AMPICILLIN/SULBACTAM 16 INTERMEDIATE Intermediate     PIP/TAZO Value in next row Sensitive      <=4 SENSITIVEThis is a modified FDA-approved test that has been validated and its performance characteristics determined by the reporting laboratory.  This laboratory is certified under the Clinical Laboratory Improvement Amendments  CLIA as qualified to perform high complexity clinical laboratory testing.    * MODERATE ESCHERICHIA COLI     Time coordinating discharge: 32 min   SIGNED: Samarrah Tranchina, DO [facility] 06/26/2024, 3:15 PM Pager   If 7PM-7AM, please contact night-coverage

## 2024-06-28 ENCOUNTER — Telehealth: Payer: Self-pay | Admitting: *Deleted

## 2024-06-28 NOTE — Transitions of Care (Post Inpatient/ED Visit) (Signed)
 06/28/2024  Name: Yolanda Gray MRN: [id number] DOB: July 26, 1956  Today's TOC FU Call Status: Today's TOC FU Call Status:: Successful TOC FU Call Completed TOC FU Call Complete Date: 06/28/24  Patient's Name and Date of Birth confirmed. Name, DOB  Transition Care Management Follow-up Telephone Call Date of Discharge: 06/26/24 Discharge Facility: [facility]) Type of Discharge: Inpatient Admission Primary Inpatient Discharge Diagnosis:: New onset DM II with DKA; abdominal pain: diverticular abscess with drain placement How have you been since you were released from the hospital?: Better (I am much better and have been busy this morning getting all  the doctor appointments scheduled- I am scheduled to establish new care with a PCP at [facility] on 07/02/24) Any questions or concerns?: No  Full TOC call/ assessments deferred accordingly per patient report of now has PCP appointment to establish care 07/02/24 with PCP Dr. Camie Boston at [facility]: non- [facility] practice  Items Reviewed: Did you receive and understand the discharge instructions provided?: Yes (briefly reviewed with patient who verbalizes good understanding of same) Medications obtained,verified, and reconciled?: No Medications Not Reviewed Reasons:: Other: (Patient verified has PCP appointment with [facility] PCP scheduled for 07/02/24- deferred medication review accordingly) Any new allergies since your discharge?: No Dietary orders reviewed?: No Do you have support at home?: Yes People in Home [RPT]: spouse  Medications Reviewed Today: Medications Reviewed Today     Reviewed by Meila Berke M, RN (Registered Nurse) on 06/28/24 at 1102  Med List Status: <None>   Medication Order Taking? Sig Documenting Provider Last Dose Status Informant  amiodarone (PACERONE) 200 MG tablet [id number]  Take 1 tablet (200 mg total) by mouth 2 (two) times daily for 9 days, THEN 1 tablet (200  mg total) daily for 14 days. Dezii, Alexandra, DO  Active   apixaban (ELIQUIS) 5 MG TABS tablet [id number]  Take 1 tablet (5 mg total) by mouth 2 (two) times daily. Dezii, Alexandra, DO  Active   Blood Glucose Monitoring Suppl (BLOOD GLUCOSE MONITOR SYSTEM) w/Device KIT [id number]  1 each by Does not apply route as directed. Dispense based on patient and insurance preference. Use up to four times daily as directed. (FOR ICD-10 E10.9, E11.9). Dezii, Alexandra, DO  Active   ciprofloxacin (CIPRO) 500 MG tablet [id number]  Take 1 tablet (500 mg total) by mouth 2 (two) times daily for 14 days. Barrientos Trevorton, [place], PA-C  Active   Glucagon (BAQSIMI TWO PACK) 3 MG/DOSE POWD [id number]  Place 3 mg into the nose as needed for up to 2 doses (Severe low blood sugar). Give 3 mg (one actuation) into a single nostril. Dezii, Alexandra, DO  Active   Glucose Blood (BLOOD GLUCOSE TEST STRIPS) STRP [id number]  1 each by Does not apply route as directed. Dispense based on patient and insurance preference. Use up to four times daily as directed. (FOR ICD-10 E10.9, E11.9). Dezii, Alexandra, DO  Active   insulin aspart (NOVOLOG) 100 UNIT/ML FlexPen [id number]  Inject 0-6 Units into the skin 3 (three) times daily with meals. Check Blood Glucose (BG) and inject per scale: BG <150= 0 unit; BG 150-200= 1 unit; BG 201-250= 2 unit; BG 251-300= 3 unit; BG 301-350= 4 unit; BG 351-400= 5 unit; BG >400= 6 unit and Call Primary Care. Dezii, Alexandra, DO  Active   insulin glargine (LANTUS) 100 UNIT/ML Solostar Pen [id number]  Inject 15 Units into the skin daily. May substitute as needed per insurance. Dezii, Alexandra, DO  Active  Insulin Pen Needle 32G X 4 MM MISC [id number]  1 each by Does not apply route as directed. Dispense based on patient and insurance preference. Use up to four times daily as directed. (FOR ICD-10 E10.9, E11.9). Dezii, Alexandra, DO  Active   Lancet Device MISC [id number]  1 each by Does not apply route as directed.  Dispense based on patient and insurance preference. Use up to four times daily as directed. (FOR ICD-10 E10.9, E11.9). Dezii, Alexandra, DO  Active   Lancets MISC [id number]  1 each by Does not apply route as directed. Dispense based on patient and insurance preference. Use up to four times daily as directed. (FOR ICD-10 E10.9, E11.9). Dezii, Alexandra, DO  Active   Metoprolol  Tartrate 37.5 MG TABS [id number]  Take 1 tablet (37.5 mg total) by mouth 2 (two) times daily. Dezii, Alexandra, DO  Active   metroNIDAZOLE (FLAGYL) 500 MG tablet [id number]  Take 1 tablet (500 mg total) by mouth 3 (three) times daily for 14 days. [address] [place], PA-C  Active   pantoprazole (PROTONIX) 40 MG tablet [id number]  Take 1 tablet (40 mg total) by mouth 2 (two) times daily before a meal for 14 days. Dezii, Alexandra, DO  Active   sodium chloride  flush 0.9 % SOLN injection [id number]  Use 10 mLs by Intracatheter route daily. Spencer Glennon HERO, PA-C  Active            Home Care and Equipment/Supplies: Were Home Health Services Ordered?: No Any new equipment or medical supplies ordered?: No  Functional Questionnaire:    Follow up appointments reviewed: PCP Follow-up appointment confirmed?: Yes Date of PCP follow-up appointment?: 07/02/24 Follow-up Provider: New PCP: at [facility] Do you need transportation to your follow-up appointment?: No Do you understand care options if your condition(s) worsen?: Yes-patient verbalized understanding  SDOH Interventions Today    Flowsheet Row Most Recent Value  SDOH Interventions   Food Insecurity Interventions Patient Declined  Housing Interventions Patient Declined  Transportation Interventions Intervention Not Indicated  [Reports husband to provide transportation post-hospital discharge 06/26/24]  Utilities Interventions Patient Declined    Pls call/ message for questions,  Mercer Peifer Mckinney Zamaya Rapaport, RN, BSN, Media Planner   Transitions of Care  [facility] [phone]: direct office

## 2024-07-05 ENCOUNTER — Other Ambulatory Visit: Payer: Self-pay | Admitting: General Surgery

## 2024-07-05 DIAGNOSIS — K5720 Diverticulitis of large intestine with perforation and abscess without bleeding: Secondary | ICD-10-CM

## 2024-07-07 ENCOUNTER — Inpatient Hospital Stay: Admission: RE | Admit: 2024-07-07 | Discharge: 2024-07-07 | Attending: Radiology

## 2024-07-07 ENCOUNTER — Ambulatory Visit
Admission: RE | Admit: 2024-07-07 | Discharge: 2024-07-07 | Disposition: A | Discharge status: Home / Self Care | Source: Ambulatory Visit | Attending: General Surgery | Admitting: General Surgery

## 2024-07-07 DIAGNOSIS — K5720 Diverticulitis of large intestine with perforation and abscess without bleeding: Secondary | ICD-10-CM

## 2024-07-07 MED ORDER — IOPAMIDOL (ISOVUE-300) INJECTION 61%
10.0000 mL | Freq: Once | INTRAVENOUS | Status: AC | PRN
  Administered 2024-07-07: 7 mL

## 2024-07-07 MED ORDER — IOPAMIDOL (ISOVUE-300) INJECTION 61%
100.0000 mL | Freq: Once | INTRAVENOUS | Status: AC | PRN
  Administered 2024-07-07: 100 mL via INTRAVENOUS

## 2024-07-07 NOTE — Progress Notes (Signed)
 Referring Physician(s): Cintron-Diaz,Edgardo  Chief Complaint: The patient is seen in follow up today s/p RLQ diverticular abscess drain placed in IR 06/22/24.  History of present illness:  Follows with Dr [name] [facility] She denies pain; fever; chills OP is minimal ~5 cc daily x last few days Flushes 5 cc daily OP is a clear serous color Still taking Cipro  and Flagyl  daily--- has approx 4-5 days left of antibiotics  Pt is here today for CT and drain injection/drain management    Past Medical History:  Diagnosis Date   COPD (chronic obstructive pulmonary disease) (HCC)    Edentulous    Morbid obesity with BMI of 40.0-44.9, adult (HCC)    Obesity (BMI 30-39.9)    Polycythemia    Thrombocytopenia    Tobacco abuse    Tobacco abuse     Past Surgical History:  Procedure Laterality Date   CATARACT EXTRACTION W/PHACO Left 10/09/2023   Procedure: CATARACT EXTRACTION PHACO AND INTRAOCULAR LENS PLACEMENT (IOC) LEFT MALYUGIN OMIDRIA  13.44 01:09.5;  Surgeon: Enola Feliciano Hugger, MD;  Location: [facility];  Service: Ophthalmology;  Laterality: Left;   CATARACT EXTRACTION W/PHACO Right 10/30/2023   Procedure: PHACOEMULSIFICATION, CATARACT, WITH IOL INSERTION 6.65, 00:53.8;  Surgeon: Enola Feliciano Hugger, MD;  Location: [facility];  Service: Ophthalmology;  Laterality: Right;   CESAREAN SECTION     TONSILLECTOMY      Allergies: Porcine (pork) protein-containing drug products  Medications: Prior to Admission medications   Medication Sig Start Date End Date Taking? Authorizing Provider  amiodarone  (PACERONE ) 200 MG tablet Take 1 tablet (200 mg total) by mouth 2 (two) times daily for 9 days, THEN 1 tablet (200 mg total) daily for 14 days. 06/26/24 07/19/24  Dezii, Alexandra, DO  apixaban  (ELIQUIS ) 5 MG TABS tablet Take 1 tablet (5 mg total) by mouth 2 (two) times daily. 06/26/24 09/24/24  Dezii, Alexandra, DO  Blood Glucose Monitoring Suppl (BLOOD  GLUCOSE MONITOR SYSTEM) w/Device KIT 1 each by Does not apply route as directed. Dispense based on patient and insurance preference. Use up to four times daily as directed. (FOR ICD-10 E10.9, E11.9). 06/26/24   Dezii, Alexandra, DO  ciprofloxacin  (CIPRO ) 500 MG tablet Take 1 tablet (500 mg total) by mouth 2 (two) times daily for 14 days. 06/25/24 07/09/24  Barrientos Solis, Adyazbeth, PA-C  Glucagon  (BAQSIMI  TWO PACK) 3 MG/DOSE POWD Place 3 mg into the nose as needed for up to 2 doses (Severe low blood sugar). Give 3 mg (one actuation) into a single nostril. 06/26/24   Dezii, Alexandra, DO  Glucose Blood (BLOOD GLUCOSE TEST STRIPS) STRP 1 each by Does not apply route as directed. Dispense based on patient and insurance preference. Use up to four times daily as directed. (FOR ICD-10 E10.9, E11.9). 06/26/24   Dezii, Alexandra, DO  insulin  aspart (NOVOLOG ) 100 UNIT/ML FlexPen Inject 0-6 Units into the skin 3 (three) times daily with meals. Check Blood Glucose (BG) and inject per scale: BG <150= 0 unit; BG 150-200= 1 unit; BG 201-250= 2 unit; BG 251-300= 3 unit; BG 301-350= 4 unit; BG 351-400= 5 unit; BG >400= 6 unit and Call Primary Care. 06/26/24   Dezii, Alexandra, DO  insulin  glargine (LANTUS ) 100 UNIT/ML Solostar Pen Inject 15 Units into the skin daily. May substitute as needed per insurance. 06/26/24   Dezii, Alexandra, DO  Insulin  Pen Needle 32G X 4 MM MISC 1 each by Does not apply route as directed. Dispense based on patient and insurance preference. Use up  to four times daily as directed. (FOR ICD-10 E10.9, E11.9). 06/26/24   Dezii, Alexandra, DO  Lancet Device MISC 1 each by Does not apply route as directed. Dispense based on patient and insurance preference. Use up to four times daily as directed. (FOR ICD-10 E10.9, E11.9). 06/26/24   Dezii, Alexandra, DO  Lancets MISC 1 each by Does not apply route as directed. Dispense based on patient and insurance preference. Use up to four times daily as  directed. (FOR ICD-10 E10.9, E11.9). 06/26/24   Dezii, Alexandra, DO  Metoprolol  Tartrate 37.5 MG TABS Take 1 tablet (37.5 mg total) by mouth 2 (two) times daily. 06/26/24 07/26/24  Dezii, Alexandra, DO  metroNIDAZOLE  (FLAGYL ) 500 MG tablet Take 1 tablet (500 mg total) by mouth 3 (three) times daily for 14 days. 06/25/24 07/09/24  Barrientos Solis, Adyazbeth, PA-C  pantoprazole  (PROTONIX ) 40 MG tablet Take 1 tablet (40 mg total) by mouth 2 (two) times daily before a meal for 14 days. 06/26/24 07/10/24  Dezii, Alexandra, DO  sodium chloride  flush 0.9 % SOLN injection Use 10 mLs by Intracatheter route daily. 06/25/24   McInnis, Glennon HERO, PA-C     Family History  Problem Relation Age of Onset   Asthma Mother    Heart attack Mother    Heart attack Father     Social History   Socioeconomic History   Marital status: Married    Spouse name: Not on file   Number of children: Not on file   Years of education: Not on file   Highest education level: Not on file  Occupational History   Not on file  Tobacco Use   Smoking status: Former    Current packs/day: 0.00    Types: Cigarettes    Quit date: 2022    Years since quitting: 3.9   Smokeless tobacco: Never  Vaping Use   Vaping status: Never Used  Substance and Sexual Activity   Alcohol use: Not Currently   Drug use: Never   Sexual activity: Not on file  Other Topics Concern   Not on file  Social History Narrative   Not on file   Social Drivers of Health   Financial Resource Strain: Medium Risk (07/02/2024)   Received from [facility]   Overall Financial Resource Strain (CARDIA)    Difficulty of Paying Living Expenses: Somewhat hard  Food Insecurity: Food Insecurity Present (07/02/2024)   Received from [facility]   Hunger Vital Sign    Within the past 12 months, you worried that your food would run out before you got the money to buy more.: Sometimes true    Within the past 12 months, the  food you bought just didn't last and you didn't have money to get more.: Sometimes true  Transportation Needs: No Transportation Needs (07/02/2024)   Received from [facility] - Transportation    In the past 12 months, has lack of transportation kept you from medical appointments or from getting medications?: No    Lack of Transportation (Non-Medical): No  Physical Activity: Not on file  Stress: Not on file  Social Connections: Unknown (06/22/2024)   Social Connection and Isolation Panel    Frequency of Communication with Friends and Family: More than three times a week    Frequency of Social Gatherings with Friends and Family: More than three times a week    Attends Religious Services: More than 4 times per year    Active Member  of Clubs or Organizations: Patient declined    Attends Banker Meetings: Patient declined    Marital Status: Not on file     Vital Signs: There were no vitals taken for this visit.  Physical Exam Abdominal:     Palpations: Abdomen is soft.     Tenderness: There is no abdominal tenderness.  Skin:    General: Skin is warm and dry.     Comments: Site is clean and dry NT No redness No sign of infection  JP bulb has less that 10 cc in it-- color is clear serous  Drain removed in its entirety. No complication Dressing placed   Neurological:     Mental Status: She is alert.     Imaging: No results found.  Labs:  CBC: Recent Labs    06/23/24 0137 06/24/24 0450 06/25/24 0420 06/26/24 0438  WBC 14.3* 15.7* 11.2* 10.6*  HGB 11.2* 11.0* 10.5* 11.3*  HCT 35.6* 35.2* 33.3* 35.4*  PLT 267 270 282 356    COAGS: Recent Labs    06/23/24 1859 06/23/24 2239 06/24/24 0450 06/25/24 0420  APTT 92* 87* 75* 76*    BMP: Recent Labs    06/23/24 0137 06/24/24 0450 06/25/24 0420 06/26/24 0438  NA 135 133* 134* 131*  K 4.1 4.2 3.7 3.6  CL 98 96* 96* 93*  CO2 26 27 26 27   GLUCOSE 173* 154* 137* 139*   BUN 17 19 16 15   CALCIUM 8.5* 8.5* 8.4* 8.5*  CREATININE 0.87 0.96 0.84 0.85  GFRNONAA >60 >60 >60 >60    LIVER FUNCTION TESTS: Recent Labs    06/21/24 1017 06/22/24 0338 06/22/24 2006  BILITOT 1.1 0.9 0.5  AST 40 23 24  ALT 19 16 17   ALKPHOS 85 53 81  PROT 9.1* 7.0 7.1  ALBUMIN 3.5 2.9* 3.3*    Assessment:  Diverticular abscess CT today showing resolved collection- may have a very minute collection that is adjacent to where drain catheter is placed.  This very small collection does not appear to be connected to collection with catheter. Drain injection does NOT show fistula Discussed with Dr Jenna: Removed drain today Did aspirate as I removed drain-- 4 cc serous fluid aspirated. Pt to follow with Dr Cesar Coe- no appt scheduled at this time.  He will receive this note; finish antibiotics. Pt knows to watch for any pain; fever--- in which case she is to go to ED. Pt has good understanding and is agreeable to plan   Signed: Sharlet DELENA Candle, PA-C 07/07/2024, 9:26 AM   Please refer to Dr. Jenna attestation of this note for management and plan.

## 2024-08-10 ENCOUNTER — Encounter: Attending: Family Medicine | Admitting: Dietician

## 2024-08-10 ENCOUNTER — Encounter: Payer: Self-pay | Admitting: Dietician

## 2024-08-10 DIAGNOSIS — E1100 Type 2 diabetes mellitus with hyperosmolarity without nonketotic hyperglycemic-hyperosmolar coma (NKHHC): Secondary | ICD-10-CM | POA: Diagnosis not present

## 2024-08-10 DIAGNOSIS — Z713 Dietary counseling and surveillance: Secondary | ICD-10-CM | POA: Diagnosis present

## 2024-08-10 DIAGNOSIS — E1169 Type 2 diabetes mellitus with other specified complication: Secondary | ICD-10-CM

## 2024-08-10 NOTE — Patient Instructions (Addendum)
 Avoid foods that can cause inflammation/pain with diverticulitis like raw tomatoes, pickles, cucumbers, squash (with seeds), corn, popcorn, strawberries, raspberries, blackberries, nuts, seeds, raw lettuce.  Choose soft cooked vegetables in place of raw to be gentle on your GI tract.  Check your blood sugar each morning before eating or drinking (fasting). Look for numbers under 130 mg/dL Check your blood sugar 2 hours after you begin eating a meal. Look for numbers under 180 mg/dL at all times.  Your goal A1c is below 7.0%  Choose fat-free greek yogurt when you have yogurt.  When reading labels, look for the amount of food to give you 30-35g of carbs per meal! Have a portion size 1 cup (fist size) of your starches (potatoes, corn, peas, pasta, rice, cereal, oatmeal, grits).

## 2024-08-10 NOTE — Progress Notes (Signed)
 Diabetes Self-Management Education  Visit Type: First/Initial  Appt. Start Time: 0930 Appt. End Time: 1030  08/10/2024  Ms. Yolanda Gray, identified by name and date of birth, is a 68 y.o. female with a diagnosis of Diabetes: Type 2.   ASSESSMENT Pt reports being found to be in DKA when hospitalized for abscess of sigmoid colon from diveticulitis in early November. Pt states they were placed on MDI insulin  therapy with sliding scale, long acting daily glargine injections @15u , and CGM in ED. Pt states currently taking Lantus  @8u , no meal time insulin , and using finger stick SMBG 2-3 times daily (fasting, before lunch, before bed). Pt reports weight loss of ~30 lbs in 6-7 weeks since hospitalization, states it is due to being concerned about getting pain from diverticulitis, hesitant about what they eat. Pt reports reading labels for carbohydrate content on all packaged foods, trying to choose lowest carb options. Pt reports no structured exercise but is active during the day walking their dog, and doing chores around the house   Diabetes Self-Management Education - 08/10/24 0942       Visit Information   Visit Type First/Initial      Initial Visit   Diabetes Type Type 2    Date Diagnosed 06/2024    Are you currently following a meal plan? No    Are you taking your medications as prescribed? Yes      Health Coping   How would you rate your overall health? Good      Psychosocial Assessment   Patient Belief/Attitude about Diabetes Motivated to manage diabetes    What is the hardest part about your diabetes right now, causing you the most concern, or is the most worrisome to you about your diabetes?   Making healty food and beverage choices    Self-care barriers None    Self-management support Doctor's office;Family    Other persons present Patient    Patient Concerns Nutrition/Meal planning;Glycemic Control    Special Needs None    Preferred Learning Style Hands on;Auditory     Learning Readiness Change in progress    How often do you need to have someone help you when you read instructions, pamphlets, or other written materials from your doctor or pharmacy? 1 - Never    What is the last grade level you completed in school? 12th grade      Pre-Education Assessment   Patient understands the diabetes disease and treatment process. Needs Instruction    Patient understands incorporating nutritional management into lifestyle. Needs Instruction    Patient undertands incorporating physical activity into lifestyle. Needs Instruction    Patient understands using medications safely. Needs Instruction    Patient understands monitoring blood glucose, interpreting and using results Needs Instruction    Patient understands prevention, detection, and treatment of acute complications. Needs Instruction    Patient understands prevention, detection, and treatment of chronic complications. Needs Instruction    Patient understands how to develop strategies to address psychosocial issues. Needs Instruction    Patient understands how to develop strategies to promote health/change behavior. Needs Instruction      Complications   Last HgB A1C per patient/outside source 10.7 %   06/21/2024   How often do you check your blood sugar? 3-4 times/day    Fasting Blood glucose range (mg/dL) 29-870    Postprandial Blood glucose range (mg/dL) 869-820;29-870    Have you had a dilated eye exam in the past 12 months? Yes    Have you had a  dental exam in the past 12 months? No    Are you checking your feet? Yes    How many days per week are you checking your feet? 7      Dietary Intake   Breakfast [employer] Fried egg biscuit    Snack (morning) 5-6 [employer]ers      Activity / Exercise   Activity / Exercise Type ADL's    How many days per week do you exercise? 0    How many minutes per day do you exercise? 0    Total minutes per week of exercise 0      Patient Education   Previous Diabetes  Education No    Disease Pathophysiology Factors that contribute to the development of diabetes    Healthy Eating Role of diet in the treatment of diabetes and the relationship between the three main macronutrients and blood glucose level;Reviewed blood glucose goals for pre and post meals and how to evaluate the patients' food intake on their blood glucose level.    Being Active Identified with patient nutritional and/or medication changes necessary with exercise.    Medications Reviewed patients medication for diabetes, action, purpose, timing of dose and side effects.;Taught/reviewed insulin /injectables, injection, site rotation, insulin /injectables storage and needle disposal.    Monitoring Identified appropriate SMBG and/or A1C goals.    Chronic complications Relationship between chronic complications and blood glucose control    Diabetes Stress and Support Identified and addressed patients feelings and concerns about diabetes      Individualized Goals (developed by patient)   Nutrition Follow meal plan discussed    Medications take my medication as prescribed    Monitoring  Test my blood glucose as discussed    Problem Solving Eating Pattern   Don't skip meals   Reducing Risk examine blood glucose patterns    Health Coping Ask for help with psychological, social, or emotional issues      Post-Education Assessment   Patient understands the diabetes disease and treatment process. Comprehends key points    Patient understands incorporating nutritional management into lifestyle. Needs Review    Patient undertands incorporating physical activity into lifestyle. Needs Review    Patient understands using medications safely. Demonstrates understanding / competency    Patient understands monitoring blood glucose, interpreting and using results Comprehends key points    Patient understands prevention, detection, and treatment of acute complications. Comprehends key points    Patient understands  prevention, detection, and treatment of chronic complications. Comprehends key points    Patient understands how to develop strategies to address psychosocial issues. Comprehends key points    Patient understands how to develop strategies to promote health/change behavior. Needs Review      Outcomes   Expected Outcomes Demonstrated interest in learning. Expect positive outcomes    Future DMSE PRN    Program Status Not Completed          Individualized Plan for Diabetes Self-Management Training:   Learning Objective:  Patient will have a greater understanding of diabetes self-management. Patient education plan is to attend individual and/or group sessions per assessed needs and concerns.   Plan:   Patient Instructions  Avoid foods that can cause inflammation/pain with diverticulitis like raw tomatoes, pickles, cucumbers, squash (with seeds), corn, popcorn, strawberries, raspberries, blackberries, nuts, seeds, raw lettuce.  Choose soft cooked vegetables in place of raw to be gentle on your GI tract.  Check your blood sugar each morning before eating or drinking (fasting). Look for numbers under 130 mg/dL  Check your blood sugar 2 hours after you begin eating a meal. Look for numbers under 180 mg/dL at all times.  Your goal A1c is below 7.0%  Choose fat-free greek yogurt when you have yogurt.  When reading labels, look for the amount of food to give you 30-35g of carbs per meal! Have a portion size 1 cup (fist size) of your starches (potatoes, corn, peas, pasta, rice, cereal, oatmeal, grits).   Expected Outcomes:  Demonstrated interest in learning. Expect positive outcomes  Education material provided: Food label handouts and My Plate  If problems or questions, patient to contact team via:  Phone and Email  Future DSME appointment: PRN.

## 2024-08-13 NOTE — H&P (View-Only) (Signed)
 " History of Present Illness Yolanda Gray is a 69 year old female with complicated sigmoid diverticulitis, status post percutaneous drainage of pericolic abscess, presenting for surgical follow-up.  She was admitted on June 21, 2024 with complicated diverticulitis of the sigmoid colon and a pericolic abscess, managed with intravenous antibiotics and percutaneous drainage. There was no evidence of generalized peritonitis, and infection remained localized.  A CT scan on July 07, 2024 showed resolution of the abscess. Contrast injection through the drain revealed no visceral leak, and the drain was removed. Imaging noted a small food collection adjacent to the prior abscess site.  I personally evaluated this images.  Since discharge, she has experienced intermittent abdominal pain localized to the previous drain site, described as similar to a pulled muscle. Last week, the pain was more diffuse and severe but has since improved and is now limited to a smaller area. She denies fever or other systemic symptoms.  She uses ibuprofen intermittently for pain, which she finds more effective than acetaminophen . She expresses concern about NSAID use because she was told it could be bad for her kidneys, but she denies any known history of kidney dysfunction or current renal symptoms. No prior colonoscopy has been performed.      PAST MEDICAL HISTORY:  Past Medical History:  Diagnosis Date   Abdominal aortic atherosclerosis () 06/2024   Incidental finding on CT abd.   Abscess of sigmoid colon due to diverticulitis 06/21/2024   Acute respiratory failure with hypoxia (CMS/HHS-HCC) 09/22/2021   AKI (acute kidney injury) 06/21/2024   Bell's palsy 06/2024   COPD exacerbation (CMS/HHS-HCC) 09/22/2021   DKA (diabetic ketoacidosis) (CMS/HHS-HCC) 06/21/2024   Fatty liver 06/2024   Incidental finding on CT abd   Obesity (BMI 30-39.9), unspecified 09/22/2021   Polycythemia 09/22/2021   Septic  shock (CMS/HHS-HCC) 06/21/2024   T2DM (type 2 diabetes mellitus) (CMS/HHS-HCC) 06/22/2024   Thrombocytopenia () 09/22/2021   Tobacco abuse 09/22/2021   Uterine fibroid 06/2024   Incidental finding on CT abd        PAST SURGICAL HISTORY:   Past Surgical History:  Procedure Laterality Date   CESAREAN SECTION  1989   CATARACT EXTRACTION Bilateral 2025         MEDICATIONS:  Outpatient Encounter Medications as of 08/13/2024  Medication Sig Dispense Refill   insulin  GLARGINE (LANTUS  SOLOSTAR) pen injector (concentration 100 units/mL) Inject 8 Units subcutaneously once daily     ACCU-CHEK GUIDE GLUCOSE METER Misc as directed     ACCU-CHEK SOFTCLIX LANCETS lancets 4 (four) times daily     AMIOdarone  (PACERONE ) 200 MG tablet Take 1 tablet (200 mg total) by mouth 2 (two) times daily for 9 days, THEN 1 tablet (200 mg total) daily for 14 days.     apixaban  (ELIQUIS ) 5 mg tablet Take 1 tablet (5 mg total) by mouth 2 (two) times daily for 90 days (Patient not taking: Reported on 08/13/2024) 60 tablet 11   dabigatran (PRADAXA) 150 mg capsule Take 1 capsule (150 mg total) by mouth 2 (two) times daily (Patient not taking: Reported on 08/13/2024) 60 capsule 11   NANO 2ND GEN PEN NEEDLE 32 gauge x 5/32 Ndle 3 (three) times a day (Patient not taking: Reported on 08/13/2024)     NOVOLOG  FLEXPEN U-100 INSULIN  pen injector (concentration 100 units/mL) Inject 1-6 Units subcutaneously 3 (three) times daily with meals Inject 0-6 Units into the skin 3 (three) times daily with meals. Check Blood Glucose (BG) and inject per scale: BG <200= 0 unit; BG  201-250= 1 unit; BG 251-300= 2 unit; BG 301-350= 3 unit; BG 351-400= 4 unit; BG >400= 5 unit and Call Primary Care. (Patient not taking: Reported on 08/13/2024)     sodium chloride  0.9%, PF, injection Take 10 mLs via catheter once daily (Patient not taking: Reported on 08/13/2024)     No facility-administered encounter medications on file as of 08/13/2024.      ALLERGIES:   Pork/porcine containing products   SOCIAL HISTORY:  Social History   Socioeconomic History   Marital status: Married    Spouse name: Leida Luton  Tobacco Use   Smoking status: Former    Current packs/day: 2.00    Types: Cigarettes   Tobacco comments:    Stopped smoking 2022  Vaping Use   Vaping status: Never Used  Substance and Sexual Activity   Alcohol use: No   Drug use: No   Sexual activity: Yes    Partners: Male    Birth control/protection: None   Social Drivers of Corporate Investment Banker Strain: Low Risk  (08/13/2024)   Overall Financial Resource Strain (CARDIA)    Difficulty of Paying Living Expenses: Not hard at all  Recent Concern: Financial Resource Strain - Medium Risk (07/13/2024)   Overall Financial Resource Strain (CARDIA)    Difficulty of Paying Living Expenses: Somewhat hard  Food Insecurity: No Food Insecurity (08/13/2024)   Hunger Vital Sign    Worried About Running Out of Food in the Last Year: Never true    Ran Out of Food in the Last Year: Never true  Recent Concern: Food Insecurity - Food Insecurity Present (07/13/2024)   Hunger Vital Sign    Worried About Running Out of Food in the Last Year: Sometimes true    Ran Out of Food in the Last Year: Sometimes true  Transportation Needs: No Transportation Needs (08/13/2024)   PRAPARE - Administrator, Civil Service (Medical): No    Lack of Transportation (Non-Medical): No  Recent Concern: Transportation Needs - Unmet Transportation Needs (07/13/2024)   PRAPARE - Administrator, Civil Service (Medical): Yes    Lack of Transportation (Non-Medical): Yes    FAMILY HISTORY:  Family History  Problem Relation Name Age of Onset   Diabetes type II Mother     Myocardial Infarction (Heart attack) Mother     Diabetes type II Brother       GENERAL REVIEW OF SYSTEMS:   General ROS: negative for - chills, fatigue, fever, weight gain or weight  loss Allergy and Immunology ROS: negative for - hives  Hematological and Lymphatic ROS: negative for - bleeding problems or bruising, negative for palpable nodes Endocrine ROS: negative for - heat or cold intolerance, hair changes Respiratory ROS: negative for - cough, shortness of breath or wheezing Cardiovascular ROS: no chest pain or palpitations GI ROS: negative for nausea, vomiting, positive abdominal pain Musculoskeletal ROS: negative for - joint swelling or muscle pain Neurological ROS: negative for - confusion, syncope Dermatological ROS: negative for pruritus and rash  PHYSICAL EXAM:  Vitals:   08/13/24 1025  BP: 113/76  Pulse: 97  .  Ht:160 cm (5' 3) Wt:97.5 kg (215 lb) ADJ:Anib surface area is 2.08 meters squared. Body mass index is 38.09 kg/m.SABRA   GENERAL: Alert, active, oriented x3  HEENT: Pupils equal reactive to light. Extraocular movements are intact. Sclera clear. Palpebral conjunctiva normal red color.Pharynx clear.  NECK: Supple with no palpable mass and no adenopathy.  LUNGS: Sound clear with  no rales rhonchi or wheezes.  HEART: Regular rhythm S1 and S2 without murmur.  ABDOMEN: Soft and depressible, nontender with no palpable mass, no hepatomegaly.   EXTREMITIES: Well-developed well-nourished symmetrical with no dependent edema.  NEUROLOGICAL: Awake alert oriented, facial expression symmetrical, moving all extremities.   Results Radiology Abdomen and pelvis CT (07/07/2024): Resolution of pericolic abscess; no evidence of visceral injury on contrast injection through drain; small food collection adjacent to area    Assessment & Plan Complicated diverticulitis of the colon with prior perforation and pericolic abscess She is in a post-acute phase after percutaneous drainage and antibiotics. Elective minimally invasive resection is indicated to prevent recurrence and further complications, as future episodes may increase the risk of permanent colostomy. The  absence of active infection supports a laparoscopic approach. The risk of requiring a temporary colostomy is low but was discussed in detail. A preoperative colonoscopy is necessary to exclude malignancy and assess for additional colonic pathology. Short-term ibuprofen is appropriate for mild abdominal pain given her normal renal function and limited duration of use. A colonoscopy is ordered to evaluate for malignancy and additional colonic pathology prior to surgery. An elective minimally invasive resection of the affected colon segment is planned following colonoscopy. Perioperative pain management includes short-term ibuprofen as needed for mild abdominal pain. The potential need for a temporary colostomy if intraoperative findings preclude safe primary anastomosis was reviewed, emphasizing the low risk in the current non-infected setting. Coordination with gastroenterology and surgical colleagues is underway to expedite colonoscopy scheduling.   Diverticulitis of large intestine with perforation without bleeding [K57.20]          Patient verbalized understanding, all questions were answered, and were agreeable with the plan outlined above.   I spent a total of 60 minutes in both face-to-face and non-face-to-face activities, excluding procedures performed, for this visit on the date of this encounter.   [name], MD  Electronically signed by [name], MD "

## 2024-08-18 ENCOUNTER — Encounter: Payer: Self-pay | Admitting: Surgery

## 2024-08-19 ENCOUNTER — Other Ambulatory Visit: Payer: Self-pay

## 2024-08-20 ENCOUNTER — Ambulatory Visit: Payer: Self-pay | Admitting: General Surgery

## 2024-08-23 ENCOUNTER — Other Ambulatory Visit: Payer: Self-pay

## 2024-08-23 ENCOUNTER — Encounter
Admission: RE | Admit: 2024-08-23 | Discharge: 2024-08-23 | Disposition: A | Discharge status: Home / Self Care | Source: Ambulatory Visit | Attending: General Surgery | Admitting: General Surgery

## 2024-08-23 DIAGNOSIS — E119 Type 2 diabetes mellitus without complications: Secondary | ICD-10-CM

## 2024-08-23 DIAGNOSIS — Z01810 Encounter for preprocedural cardiovascular examination: Secondary | ICD-10-CM

## 2024-08-23 DIAGNOSIS — Z01812 Encounter for preprocedural laboratory examination: Secondary | ICD-10-CM

## 2024-08-23 DIAGNOSIS — K5720 Diverticulitis of large intestine with perforation and abscess without bleeding: Secondary | ICD-10-CM

## 2024-08-23 HISTORY — DX: Diverticulitis of large intestine with perforation and abscess without bleeding: K57.20

## 2024-08-23 NOTE — Patient Instructions (Addendum)
 Your procedure is scheduled on: 08/30/24 - Monday Report to the Registration Desk on the 1st floor of the [facility]. To find out your arrival time, please call [phone] between 1PM - 3PM on: 08/27/24 - Friday If your arrival time is 6:00 am, do not arrive before that time as the [facility] entrance doors do not open until 6:00 am.  REMEMBER: Instructions that are not followed completely may result in serious medical risk, up to and including death; or upon the discretion of your surgeon and anesthesiologist your surgery may need to be rescheduled.  Take oral antibiotics the day before Surgery as prescribed  neomycin 500 mg tablet  Indications: Diverticulitis of large intestine with perforation without bleeding Take 2 tablets at 2 pm, 3 pm and 10 pm the day before surgery.          metroNIDAZOLE  (FLAGYL ) 500 MG tablet  Indications: Diverticulitis of large intestine with perforation without bleeding Take 2 tablets at 2 pm, 3 pm and 10 pm the day before surgery.         ibuprofen (MOTRIN) 600 MG tablet  Take 1 tablet (600 mg total) by mouth every 6 (six) hours as needed for Pain for up to 5 days        Take the bowel prep the day before surgery as instructed  One week prior to surgery: Stop Anti-inflammatories (NSAIDS) such as Advil, Aleve , Ibuprofen, Motrin, Naproxen , Naprosyn  and Aspirin based products such as Excedrin, Goody's Powder, BC Powder. You may take Tylenol  if needed for pain up until the day of surgery.  Stop ANY OVER THE COUNTER supplements until after surgery.  insulin  glargine (LANTUS ) inject none on the morning of surgery.  ON THE DAY OF SURGERY ONLY TAKE THESE MEDICATIONS WITH SIPS OF WATER:  none   No Alcohol for 24 hours before or after surgery.  No Smoking including e-cigarettes for 24 hours before surgery.  No chewable tobacco products for at least 6 hours before surgery.  No nicotine patches on the day of surgery.  Do not use any recreational drugs  for at least a week (preferably 2 weeks) before your surgery.  Please be advised that the combination of cocaine and anesthesia may have negative outcomes, up to and including death. If you test positive for cocaine, your surgery will be cancelled.  On the morning of surgery brush your teeth with toothpaste and water, you may rinse your mouth with mouthwash if you wish. Do not swallow any toothpaste or mouthwash.  Use CHG Soap or wipes as directed on instruction sheet.  Do not wear jewelry, make-up, hairpins, clips or nail polish.  For welded (permanent) jewelry: bracelets, anklets, waist bands, etc.  Please have this removed prior to surgery.  If it is not removed, there is a chance that hospital personnel will need to cut it off on the day of surgery.  Do not wear lotions, powders, or perfumes.   Do not shave body hair from the neck down 48 hours before surgery.  Contact lenses, hearing aids and dentures may not be worn into surgery.  Do not bring valuables to the hospital. [facility] is not responsible for any missing/lost belongings or valuables.   Notify your doctor if there is any change in your medical condition (cold, fever, infection).  Wear comfortable clothing (specific to your surgery type) to the hospital.  After surgery, you can help prevent lung complications by doing breathing exercises.  Take deep breaths and cough every 1-2  hours. Your doctor may order a device called an Incentive Spirometer to help you take deep breaths. If you are being admitted to the hospital overnight, leave your suitcase in the car. After surgery it may be brought to your room.  In case of increased patient census, it may be necessary for you, the patient, to continue your postoperative care in the Same Day Surgery department.  If you are being discharged the day of surgery, you will not be allowed to drive home. You will need a responsible individual to drive you home and stay with you for 24  hours after surgery.   If you are taking public transportation, you will need to have a responsible individual with you.  Please call the Pre-admissions Testing Dept. at [phone] if you have any questions about these instructions.  Surgery Visitation Policy:  Patients having surgery or a procedure may have two visitors.  Children under the age of 23 must have an adult with them who is not the patient.  Inpatient Visitation:    Visiting hours are 7 a.m. to 8 p.m. Up to four visitors are allowed at one time in a patient room. The visitors may rotate out with other people during the day.  One visitor age 1 or older may stay with the patient overnight and must be in the room by 8 p.m.   Merchandiser, Retail to address health-related social needs:  [email or web address]                                                                                                            Preparing for Surgery with CHLORHEXIDINE GLUCONATE (CHG) Soap  Chlorhexidine Gluconate (CHG) Soap  o An antiseptic cleaner that kills germs and bonds with the skin to continue killing germs even after washing  o Used for showering the night before surgery and morning of surgery  Before surgery, you can play an important role by reducing the number of germs on your skin.  CHG (Chlorhexidine gluconate) soap is an antiseptic cleanser which kills germs and bonds with the skin to continue killing germs even after washing.  Please do not use if you have an allergy to CHG or antibacterial soaps. If your skin becomes reddened/irritated stop using the CHG.  1. Shower the NIGHT BEFORE SURGERY with CHG soap.  2. If you choose to wash your hair, wash your hair first as usual with your normal shampoo.  3. After shampooing, rinse your hair and body thoroughly to remove the shampoo.  4. Use CHG as you would any other liquid soap. You can apply CHG directly to the skin and wash gently with a clean  washcloth.  5. Apply the CHG soap to your body only from the neck down. Do not use on open wounds or open sores. Avoid contact with your eyes, ears, mouth, and genitals (private parts). Wash face and genitals (private parts) with your normal soap.  6. Wash thoroughly, paying special attention to the area where your surgery will be performed.  7. Thoroughly rinse your  body with warm water.  8. Do not shower/wash with your normal soap after using and rinsing off the CHG soap.  9. Do not use lotions, oils, etc., after showering with CHG.  10. Pat yourself dry with a clean towel.  11. Wear clean pajamas to bed the night before surgery.  12. Place clean sheets on your bed the night of your shower and do not sleep with pets.  13. Do not apply any deodorants/lotions/powders.  14. Please wear clean clothes to the hospital.  15. Remember to brush your teeth with your regular toothpaste.

## 2024-08-24 ENCOUNTER — Other Ambulatory Visit: Payer: Self-pay

## 2024-08-25 ENCOUNTER — Encounter
Admission: RE | Disposition: A | Discharge status: Home / Self Care | Payer: Self-pay | Source: Home / Self Care | Attending: Surgery

## 2024-08-25 ENCOUNTER — Ambulatory Visit
Admission: RE | Admit: 2024-08-25 | Discharge: 2024-08-25 | Disposition: A | Discharge status: Home / Self Care | Attending: Surgery | Admitting: Surgery

## 2024-08-25 ENCOUNTER — Ambulatory Visit: Admitting: Anesthesiology

## 2024-08-25 ENCOUNTER — Other Ambulatory Visit: Payer: Self-pay

## 2024-08-25 DIAGNOSIS — Z87891 Personal history of nicotine dependence: Secondary | ICD-10-CM | POA: Insufficient documentation

## 2024-08-25 DIAGNOSIS — D125 Benign neoplasm of sigmoid colon: Secondary | ICD-10-CM | POA: Diagnosis not present

## 2024-08-25 DIAGNOSIS — K5730 Diverticulosis of large intestine without perforation or abscess without bleeding: Secondary | ICD-10-CM | POA: Diagnosis not present

## 2024-08-25 DIAGNOSIS — I4891 Unspecified atrial fibrillation: Secondary | ICD-10-CM | POA: Insufficient documentation

## 2024-08-25 DIAGNOSIS — K620 Anal polyp: Secondary | ICD-10-CM | POA: Insufficient documentation

## 2024-08-25 DIAGNOSIS — K621 Rectal polyp: Secondary | ICD-10-CM | POA: Insufficient documentation

## 2024-08-25 DIAGNOSIS — Z09 Encounter for follow-up examination after completed treatment for conditions other than malignant neoplasm: Secondary | ICD-10-CM | POA: Diagnosis present

## 2024-08-25 DIAGNOSIS — Z8719 Personal history of other diseases of the digestive system: Secondary | ICD-10-CM | POA: Insufficient documentation

## 2024-08-25 DIAGNOSIS — E109 Type 1 diabetes mellitus without complications: Secondary | ICD-10-CM | POA: Insufficient documentation

## 2024-08-25 DIAGNOSIS — Z794 Long term (current) use of insulin: Secondary | ICD-10-CM | POA: Insufficient documentation

## 2024-08-25 DIAGNOSIS — Z6835 Body mass index (BMI) 35.0-35.9, adult: Secondary | ICD-10-CM | POA: Insufficient documentation

## 2024-08-25 DIAGNOSIS — D122 Benign neoplasm of ascending colon: Secondary | ICD-10-CM | POA: Insufficient documentation

## 2024-08-25 DIAGNOSIS — E66813 Obesity, class 3: Secondary | ICD-10-CM | POA: Insufficient documentation

## 2024-08-25 DIAGNOSIS — Q458 Other specified congenital malformations of digestive system: Secondary | ICD-10-CM | POA: Diagnosis not present

## 2024-08-25 DIAGNOSIS — D175 Benign lipomatous neoplasm of intra-abdominal organs: Secondary | ICD-10-CM | POA: Insufficient documentation

## 2024-08-25 DIAGNOSIS — J449 Chronic obstructive pulmonary disease, unspecified: Secondary | ICD-10-CM | POA: Insufficient documentation

## 2024-08-25 DIAGNOSIS — D124 Benign neoplasm of descending colon: Secondary | ICD-10-CM | POA: Insufficient documentation

## 2024-08-25 HISTORY — DX: Type 2 diabetes mellitus without complications: E11.9

## 2024-08-25 HISTORY — PX: COLONOSCOPY: SHX5424

## 2024-08-25 HISTORY — PX: POLYPECTOMY: SHX149

## 2024-08-25 LAB — GLUCOSE, CAPILLARY: Glucose-Capillary: 144 mg/dL — ABNORMAL HIGH (ref 70–99)

## 2024-08-25 MED ORDER — GLYCOPYRROLATE 0.2 MG/ML IJ SOLN
INTRAMUSCULAR | Status: DC | PRN
  Administered 2024-08-25: .2 mg via INTRAVENOUS

## 2024-08-25 MED ORDER — VASOPRESSIN 20 UNIT/ML IV SOLN
INTRAVENOUS | Status: DC | PRN
  Administered 2024-08-25 (×2): 1 [IU] via INTRAVENOUS

## 2024-08-25 MED ORDER — PROPOFOL 10 MG/ML IV BOLUS
INTRAVENOUS | Status: DC | PRN
  Administered 2024-08-25 (×2): 20 mg via INTRAVENOUS
  Administered 2024-08-25: 10 mg via INTRAVENOUS
  Administered 2024-08-25: 50 mg via INTRAVENOUS

## 2024-08-25 MED ORDER — SODIUM CHLORIDE 0.9 % IV SOLN
INTRAVENOUS | Status: DC
  Administered 2024-08-25: 20 mL/h via INTRAVENOUS

## 2024-08-25 MED ORDER — LIDOCAINE HCL (CARDIAC) PF 100 MG/5ML IV SOSY
PREFILLED_SYRINGE | INTRAVENOUS | Status: DC | PRN
  Administered 2024-08-25: 100 mg via INTRAVENOUS

## 2024-08-25 MED ORDER — VASOPRESSIN 20 UNIT/ML IV SOLN
INTRAVENOUS | Status: AC
  Filled 2024-08-25: qty 1

## 2024-08-25 MED ORDER — PHENYLEPHRINE HCL (PRESSORS) 10 MG/ML IV SOLN
INTRAVENOUS | Status: DC | PRN
  Administered 2024-08-25 (×4): 160 ug via INTRAVENOUS

## 2024-08-25 MED ORDER — PROPOFOL 500 MG/50ML IV EMUL
INTRAVENOUS | Status: DC | PRN
  Administered 2024-08-25: 165 ug/kg/min via INTRAVENOUS

## 2024-08-25 MED ORDER — PROPOFOL 10 MG/ML IV BOLUS
INTRAVENOUS | Status: AC
  Filled 2024-08-25: qty 60

## 2024-08-25 MED ORDER — EPHEDRINE SULFATE-NACL 50-0.9 MG/10ML-% IV SOSY
PREFILLED_SYRINGE | INTRAVENOUS | Status: DC | PRN
  Administered 2024-08-25 (×2): 10 mg via INTRAVENOUS
  Administered 2024-08-25: 5 mg via INTRAVENOUS

## 2024-08-25 MED ORDER — ALBUTEROL SULFATE HFA 108 (90 BASE) MCG/ACT IN AERS
INHALATION_SPRAY | RESPIRATORY_TRACT | Status: DC | PRN
  Administered 2024-08-25 (×2): 2 via RESPIRATORY_TRACT

## 2024-08-25 NOTE — Anesthesia Procedure Notes (Signed)
 Procedure Name: General with mask airway Date/Time: 08/25/2024 7:31 AM  Performed by: Ledora Duncan, CRNAPre-anesthesia Checklist: Patient identified, Emergency Drugs available, Suction available and Patient being monitored Patient Re-evaluated:Patient Re-evaluated prior to induction Oxygen Delivery Method: Simple face mask Induction Type: IV induction Placement Confirmation: positive ETCO2 and breath sounds checked- equal and bilateral Dental Injury: Teeth and Oropharynx as per pre-operative assessment

## 2024-08-25 NOTE — Transfer of Care (Signed)
 Immediate Anesthesia Transfer of Care Note  Patient: Yolanda Gray  Procedure(s) Performed: COLONOSCOPY POLYPECTOMY, INTESTINE  Patient Location: Endoscopy Unit  Anesthesia Type:General  Level of Consciousness: drowsy and patient cooperative  Airway & Oxygen Therapy: Patient Spontanous Breathing and Patient connected to face mask oxygen  Post-op Assessment: Report given to RN and Post -op Vital signs reviewed and stable  Post vital signs: Reviewed and stable  Last Vitals:  Vitals Value Taken Time  BP    Temp    Pulse 82 08/25/24 08:44  Resp 17 08/25/24 08:44  SpO2 98 % 08/25/24 08:44  Vitals shown include unfiled device data.  Last Pain:  Vitals:   08/25/24 9361  TempSrc: Temporal  PainSc: 0-No pain         Complications: No notable events documented.

## 2024-08-25 NOTE — Op Note (Addendum)
 [facility] Gastroenterology Patient Name: Yolanda Gray Procedure Date: 08/25/2024 6:46 AM MRN: [id number] Account #: [id number] Date of Birth: 12/25/1955 Admit Type: Outpatient Age: 69 Room: [facility] ENDO ROOM 1 Gender: Female Note Status: Finalized Instrument Name: Colon Scope [id number] [id number] Procedure:             Colonoscopy Indications:           Follow-up of diverticulitis Providers:             Henriette Pierre MD, MD Referring MD:          Henriette Pierre MD, MD (Referring MD), Lauraine LOIS Leak                         (Referring MD) Medicines:             Propofol  per Anesthesia Complications:         No immediate complications. Estimated blood loss:                         Minimal. Procedure:             Pre-Anesthesia Assessment:                        - After reviewing the risks and benefits, the patient                         was deemed in satisfactory condition to undergo the                         procedure in an ambulatory setting.                        After obtaining informed consent, the colonoscope was                         passed under direct vision. Throughout the procedure,                         the patient's blood pressure, pulse, and oxygen                         saturations were monitored continuously. The                         Colonoscope was introduced through the anus and                         advanced to the the cecum, identified by the ileocecal                         valve. The colonoscopy was technically difficult and                         complex due to a tortuous colon. Successful completion                         of the procedure was aided by applying abdominal  pressure. The patient tolerated the procedure well.                         The quality of the bowel preparation was fair. Findings:      The perianal and digital rectal examinations were normal.      Five semi-pedunculated polyps were found in the  sigmoid colon,       descending colon and ascending colon. The polyps were 2 to 5 mm in size.       These polyps were removed with a cold snare. Resection and retrieval       were complete. Estimated blood loss was minimal.      Seven sessile polyps were found in the distal rectum, descending colon       and ascending colon. The polyps were 2 to 3 mm in size. These were       biopsied with a cold forceps for histology. Estimated blood loss was       minimal.      There was a medium-sized lipoma, 7 mm in diameter, in the ascending       colon.      Multiple medium-mouthed diverticula were found in the sigmoid colon. Impression:            - Preparation of the colon was fair.                        - Five 2 to 5 mm polyps in the sigmoid colon, in the                         descending colon and in the ascending colon, removed                         with a cold snare. Resected and retrieved.                        - Seven 2 to 3 mm polyps in the distal rectum, in the                         descending colon and in the ascending colon. Biopsied.                        - Medium-sized lipoma in the ascending colon.                        - Diverticulosis in the sigmoid colon. Recommendation:        - Discharge patient to home.                        - Resume previous diet.                        - Written discharge instructions were provided to the                         patient.                        - Await pathology results. Procedure Code(s):     --- Professional ---  54614, Colonoscopy, flexible; with removal of                         tumor(s), polyp(s), or other lesion(s) by snare                         technique                        45380, 59, Colonoscopy, flexible; with biopsy, single                         or multiple Diagnosis Code(s):     --- Professional ---                        D12.5, Benign neoplasm of sigmoid colon                        D12.8,  Benign neoplasm of rectum                        D12.4, Benign neoplasm of descending colon                        D12.2, Benign neoplasm of ascending colon                        D17.5, Benign lipomatous neoplasm of intra-abdominal                         organs                        K57.32, Diverticulitis of large intestine without                         perforation or abscess without bleeding                        K57.30, Diverticulosis of large intestine without                         perforation or abscess without bleeding CPT copyright 2022 American Medical Association. All rights reserved. The codes documented in this report are preliminary and upon coder review may  be revised to meet current compliance requirements. Dr. Henriette Sevin, MD Henriette Pierre MD, MD 08/25/2024 8:48:47 AM This report has been signed electronically. Number of Addenda: 0 Note Initiated On: 08/25/2024 6:46 AM Scope Withdrawal Time: 0 hours 46 minutes 36 seconds  Total Procedure Duration: 1 hour 19 minutes 10 seconds  Estimated Blood Loss:  Estimated blood loss was minimal.      [facility]

## 2024-08-25 NOTE — Interval H&P Note (Signed)
 History and Physical Interval Note:  08/25/2024 7:23 AM  Yolanda Gray  has presented today for surgery, with the diagnosis of K57.20 diverticulitis.  The various methods of treatment have been discussed with the patient and family. After consideration of risks, benefits and other options for treatment, the patient has consented to  Procedures: COLONOSCOPY (N/A) as a surgical intervention.  The patient's history has been reviewed, patient examined, no change in status, stable for surgery.  I have reviewed the patient's chart and labs.  Questions were answered to the patient's satisfaction.    Pt also noted to refuse any blood transfusions since she does not want any blood from COVID vaccine recipients.  I explained there is no way to screen for that and confirmed even if her life depended on the blood transfusion, she still will refuse.  She stated she will still refuse at that time.  Reassured her this procedure very unlikely to require transfusion.  Will still proceed.   Asani Mcburney Tye

## 2024-08-25 NOTE — Anesthesia Postprocedure Evaluation (Signed)
"   Anesthesia Post Note  Patient: Yolanda Gray  Procedure(s) Performed: COLONOSCOPY POLYPECTOMY, INTESTINE  Anesthesia Type: General Level of consciousness: awake Pain management: pain level controlled Vital Signs Assessment: post-procedure vital signs reviewed and stable Respiratory status: spontaneous breathing Cardiovascular status: stable Anesthetic complications: no   No notable events documented.   Last Vitals:  Vitals:   08/25/24 0900 08/25/24 0901  BP: 128/62   Pulse: 89 87  Resp: (!) 25 20  Temp:    SpO2: 96% 98%    Last Pain:  Vitals:   08/25/24 0854  TempSrc: Temporal  PainSc: 0-No pain                 VAN STAVEREN,Shaylene Paganelli      "

## 2024-08-25 NOTE — Anesthesia Preprocedure Evaluation (Signed)
 "                                  Anesthesia Evaluation  Patient identified by MRN, date of birth, ID band Patient awake    Reviewed: Allergy & Precautions, NPO status , Patient's Chart, lab work & pertinent test results  Airway Mallampati: II  TM Distance: >3 FB Neck ROM: Full    Dental  (+) Upper Dentures, Lower Dentures   Pulmonary neg pulmonary ROS, COPD,  COPD inhaler, Patient abstained from smoking., former smoker   Pulmonary exam normal  + decreased breath sounds      Cardiovascular Exercise Tolerance: Good negative cardio ROS Normal cardiovascular exam+ dysrhythmias Atrial Fibrillation  Rhythm:Regular Rate:Normal     Neuro/Psych negative neurological ROS  negative psych ROS   GI/Hepatic negative GI ROS, Neg liver ROS,,,  Endo/Other  negative endocrine ROSdiabetes, Well Controlled, Type 1, Insulin  Dependent  Class 3 obesity  Renal/GU negative Renal ROS  negative genitourinary   Musculoskeletal   Abdominal  (+) + obese  Peds  Hematology negative hematology ROS (+)   Anesthesia Other Findings Past Medical History: No date: COPD (chronic obstructive pulmonary disease) (HCC) No date: Diabetes mellitus without complication (HCC) No date: Diverticulitis of large intestine with perforation without  bleeding No date: Edentulous No date: Morbid obesity with BMI of 40.0-44.9, adult (HCC) No date: Obesity (BMI 30-39.9) No date: Polycythemia No date: Thrombocytopenia No date: Tobacco abuse No date: Tobacco abuse  Past Surgical History: 10/09/2023: CATARACT EXTRACTION W/PHACO; Left     Comment:  Procedure: CATARACT EXTRACTION PHACO AND INTRAOCULAR               LENS PLACEMENT (IOC) LEFT MALYUGIN OMIDRIA  13.44 01:09.5;              Surgeon: Enola Feliciano Hugger, MD;  Location: [facility];  Service: Ophthalmology;  Laterality: Left; 10/30/2023: CATARACT EXTRACTION W/PHACO; Right     Comment:  Procedure:  PHACOEMULSIFICATION, CATARACT, WITH IOL               INSERTION 6.65, 00:53.8;  Surgeon: Enola Feliciano Hugger, MD;  Location: [facility];  Service:               Ophthalmology;  Laterality: Right; No date: CESAREAN SECTION No date: finger laceration No date: TONSILLECTOMY  BMI    Body Mass Index: 35.66 kg/m      Reproductive/Obstetrics negative OB ROS                              Anesthesia Physical Anesthesia Plan  ASA: 3  Anesthesia Plan: General   Post-op Pain Management:    Induction: Intravenous  PONV Risk Score and Plan: Propofol  infusion and TIVA  Airway Management Planned: Natural Airway and Nasal Cannula  Additional Equipment:   Intra-op Plan:   Post-operative Plan:   Informed Consent: I have reviewed the patients History and Physical, chart, labs and discussed the procedure including the risks, benefits and alternatives for the proposed anesthesia with the patient or authorized representative who has indicated his/her understanding and acceptance.     Dental Advisory Given  Plan Discussed with: CRNA  Anesthesia Plan Comments:  Anesthesia Quick Evaluation  "

## 2024-08-26 ENCOUNTER — Encounter: Payer: Self-pay | Admitting: Surgery

## 2024-08-26 LAB — SURGICAL PATHOLOGY

## 2024-08-26 NOTE — Progress Notes (Signed)
 " Chief Complaint  Patient presents with   clearance    Subjective  Yolanda Gray is a 69 y.o. female who presents for clearance HPI History of Present Illness Yolanda Gray is a 69 year old female with atrial fibrillation who presents for pre-surgical evaluation and medication management.  She is preparing for an upcoming surgery scheduled for Monday. Currently, she is not on a blood thinner in anticipation of the surgery. She has a history of atrial fibrillation, which increases her risk for stroke, especially in the context of surgery and anesthesia. She is concerned about the potential for developing a blood clot or stroke post-surgery if her blood thinner regimen is not properly managed.  She was previously on amiodarone  and Eliquis , but did not receive a prescription refill for amiodarone . She has not experienced any side effects from amiodarone . She is also prescribed metoprolol  to manage episodes of rapid heart rate associated with atrial fibrillation, which she experienced in the past with heart rates in the 120s to 130s.  She recently underwent a colonoscopy, which revealed multiple polyps but no other significant findings. She mentions logistical issues with her visit today, including a misunderstanding about her appointment time, which caused inconvenience.  Review of Systems  Patient Active Problem List  Diagnosis   Abscess of sigmoid colon due to diverticulitis   Obesity (BMI 30-39.9), unspecified   T2DM (type 2 diabetes mellitus) (CMS/HHS-HCC)   Tobacco abuse   Abdominal aortic atherosclerosis ()    Outpatient Medications Prior to Visit  Medication Sig Dispense Refill   ACCU-CHEK GUIDE GLUCOSE METER Misc as directed     ACCU-CHEK SOFTCLIX LANCETS lancets 4 (four) times daily     apixaban  (ELIQUIS ) 5 mg tablet Take 1 tablet (5 mg total) by mouth every 12 (twelve) hours (Patient not taking: Reported on 08/26/2024) 180 tablet 3   dabigatran (PRADAXA) 150 mg capsule Take 1  capsule (150 mg total) by mouth 2 (two) times daily (Patient not taking: Reported on 08/26/2024) 60 capsule 11   ibuprofen (MOTRIN) 600 MG tablet Take 600 mg by mouth every 6 (six) hours as needed (Patient not taking: Reported on 08/26/2024)     insulin  GLARGINE (LANTUS  SOLOSTAR) pen injector (concentration 100 units/mL) Inject 8 Units subcutaneously once daily (Patient not taking: Reported on 08/26/2024)     metroNIDAZOLE  (FLAGYL ) 500 MG tablet Take 2 tablets at 2 pm, 3 pm and 10 pm the day before surgery. (Patient not taking: Reported on 08/26/2024) 6 tablet 0   NANO 2ND GEN PEN NEEDLE 32 gauge x 5/32 Ndle 3 (three) times a day (Patient not taking: Reported on 08/26/2024)     neomycin 500 mg tablet Take 2 tablets at 2 pm, 3 pm and 10 pm the day before surgery. (Patient not taking: Reported on 08/26/2024) 6 tablet 0   NOVOLOG  FLEXPEN U-100 INSULIN  pen injector (concentration 100 units/mL) Inject 1-6 Units subcutaneously 3 (three) times daily with meals Inject 0-6 Units into the skin 3 (three) times daily with meals. Check Blood Glucose (BG) and inject per scale: BG <200= 0 unit; BG 201-250= 1 unit; BG 251-300= 2 unit; BG 301-350= 3 unit; BG 351-400= 4 unit; BG >400= 5 unit and Call Primary Care. (Patient not taking: Reported on 08/26/2024)     sodium chloride  0.9%, PF, injection Take 10 mLs via catheter once daily (Patient not taking: Reported on 08/26/2024)     AMIOdarone  (PACERONE ) 200 MG tablet Take 1 tablet (200 mg total) by mouth 2 (two) times daily for 9  days, THEN 1 tablet (200 mg total) daily for 14 days. (Patient not taking: Reported on 08/26/2024)     No facility-administered medications prior to visit.      Objective  Vitals:   08/26/24 1145  BP: 102/70  Pulse: 94  SpO2: 95%  Weight: 93.4 kg (206 lb)  Height: 160 cm (5' 3)  PainSc: 0-No pain   Body mass index is 36.49 kg/m.  Home Vitals:     Physical Exam Physical Exam   Constitutional: alert, in NAD, and communicates  well Eye exam: pupils equal and reactive, extraocular eye movements intact. Neck: supple, no thyroid enlargement or cervical adenopathy, and no bruits heard Respiratory: clear to auscultation, without rales or wheezes  Cardiovascular: regular rate and rhythm and without murmurs, rubs or gallops Lower extremities: no lower extremity edema Skin ankles/feet: warm, good capillary refill and no ulcerations or lesions noted Neurological: sensorimotor grossly intact and normal muscle tone  Results RADIOLOGY Cardiac ultrasound: Normal cardiac function, no myocardial weakening    ECHO 06/24/2024:  1. Left ventricular ejection fraction, by estimation, is 60 to 65%. The left ventricle has normal function. The left ventricle has no regional wall motion abnormalities. Left ventricular diastolic parameters are indeterminate.   2. Right ventricular systolic function is normal. The right ventricular size is normal. There is normal pulmonary artery systolic pressure. The estimated right ventricular systolic pressure is 30.0 mmHg.   3. The mitral valve is normal in structure. No evidence of mitral valve regurgitation. No evidence of mitral stenosis.   4. The aortic valve is normal in structure. Aortic valve regurgitation is not visualized. No aortic stenosis is present.   5. The inferior vena cava is normal in size with greater than 50% respiratory variability, suggesting right atrial pressure of 3 mmHg.     Assessment/Plan:   Assessment & Plan Preoperative cardiovascular evaluation Concerns about managing anticoagulation therapy due to atrial fibrillation and risk of stroke or blood clot post-surgery. Surgery necessary to prevent worsening infection and complications. - Will coordinate with surgeon regarding anticoagulation management post-surgery. - Will ensure amiodarone  and metoprolol  are started post-surgery as per surgeon's instructions. - Scheduled follow-up appointment in four weeks to assess  postoperative cardiovascular status.  Paroxysmal atrial fibrillation Currently in normal sinus rhythm. Amiodarone  prescribed to maintain normal rhythm and prevent recurrence. Metoprolol  prescribed to manage heart rate during episodes of rapid atrial fibrillation. Importance of maintaining anticoagulation therapy post-surgery to prevent stroke emphasized. - Refilled amiodarone  prescription to maintain normal sinus rhythm. - Prescribed low dose metoprolol  to manage heart rate during episodes of rapid atrial fibrillation. - Instructed to start amiodarone  and metoprolol  post-surgery as per surgeon's instructions. - Scheduled follow-up appointment in four weeks to monitor atrial fibrillation management. - Patient is in normal sinus rhythm today  Diagnoses and all orders for this visit:  PAF (paroxysmal atrial fibrillation) (CMS/HHS-HCC) -     ECG 12-lead  Abdominal aortic atherosclerosis ()  Type 2 diabetes mellitus without complication, with long-term current use of insulin  (CMS/HHS-HCC)  Tobacco abuse  Preop cardiovascular exam  Other orders -     AMIOdarone  (PACERONE ) 200 MG tablet; Take 1 tablet (200 mg total) by mouth once daily for 90 days Take 1 tablet (200 mg total) by mouth 2 (two) times daily for 9 days, THEN 1 tablet (200 mg total) daily for 14 days. -     metoprolol  SUCCinate (TOPROL -XL) 25 MG XL tablet; Take 1 tablet (25 mg total) by mouth at bedtime    Return  in about 4 weeks (around 09/23/2024).     Future Appointments     Date/Time Provider Department Center Visit Type   09/08/2024 10:30 AM Elaine Ping [facility]LL FOLLOW UP   09/14/2024 8:15 AM Cintron Hayes Lucas Schanz, MD [facility] C POST-OP   09/23/2024 11:15 AM Custovic, Annalee, DO [facility] C FOLLOW UP   10/04/2024 11:00 AM Gauger, Lauraine Collar, NP [facility] OFFICE VISIT   10/12/2024 11:45 AM (Arrive by 11:30 AM) Custovic, Sabina, DO [facility] FOLLOW UP       There are no Patient Instructions on file for this visit.  An after visit summary was provided for the patient either in written format (printed) or through [facility].  This note has been created using automated tools and reviewed for accuracy by [facility] CUSTOVIC.  "

## 2024-08-27 ENCOUNTER — Encounter
Admission: RE | Admit: 2024-08-27 | Discharge: 2024-08-27 | Disposition: A | Discharge status: Home / Self Care | Source: Ambulatory Visit | Attending: General Surgery | Admitting: General Surgery

## 2024-08-27 DIAGNOSIS — Z01812 Encounter for preprocedural laboratory examination: Secondary | ICD-10-CM

## 2024-08-27 DIAGNOSIS — Z01818 Encounter for other preprocedural examination: Secondary | ICD-10-CM | POA: Insufficient documentation

## 2024-08-27 DIAGNOSIS — Z01810 Encounter for preprocedural cardiovascular examination: Secondary | ICD-10-CM

## 2024-08-27 LAB — CBC
HCT: 34.5 % — ABNORMAL LOW (ref 36.0–46.0)
Hemoglobin: 10.7 g/dL — ABNORMAL LOW (ref 12.0–15.0)
MCH: 26.7 pg (ref 26.0–34.0)
MCHC: 31 g/dL (ref 30.0–36.0)
MCV: 86 fL (ref 80.0–100.0)
Platelets: 305 K/uL (ref 150–400)
RBC: 4.01 MIL/uL (ref 3.87–5.11)
RDW: 14.5 % (ref 11.5–15.5)
WBC: 9.1 K/uL (ref 4.0–10.5)
nRBC: 0 % (ref 0.0–0.2)

## 2024-08-27 LAB — TYPE AND SCREEN
ABO/RH(D): A POS
Antibody Screen: NEGATIVE

## 2024-08-27 NOTE — Consult Note (Signed)
 WOC Nurse requested for preoperative stoma site marking, obtained verbal consent from  patient.  Discussed surgical procedure and stoma creation with patient and spouse.  Explained role of the WOC nurse team. Provided the patient with educational booklet and provided samples of pouching options.  Answered patient questions. Patient emphasized she did not want an ostomy, explained to her that the black maker sites were only a suggestion to the surgeon, if an ostomy is needed.  Examined patient sitting and standing in order to place the marking in the patient's visual field, away from any creases or abdominal contour issues and within the rectus muscle.  Attempted to mark below the patient's belt line, large abdominal girth.   Marked for colostomy in the LLQ  __5_ cm to the left of the umbilicus and __2_cm above/below the umbilicus.  Marked for ileostomy in the RLQ  __5_cm to the right of the umbilicus and  _2__ cm above/below the umbilicus.  Patient's abdomen cleansed with CHG wipes at site markings, allowed to air dry prior to marking.Covered mark with thin film transparent dressing to preserve mark until date of surgery.. Patient was given ostomy kit box to take home. Surgical procedure will take place on Monday.  Sherrilyn Hals MSN RN CWOCN [facility]  [phone] (Available from 7-3 pm Mon-Friday)

## 2024-08-30 ENCOUNTER — Inpatient Hospital Stay: Admitting: Anesthesiology

## 2024-08-30 ENCOUNTER — Encounter: Payer: Self-pay | Admitting: General Surgery

## 2024-08-30 ENCOUNTER — Inpatient Hospital Stay
Admission: RE | Admit: 2024-08-30 | Discharge: 2024-09-03 | DRG: 330 | Disposition: A | Discharge status: Home / Self Care | Attending: General Surgery | Admitting: General Surgery

## 2024-08-30 ENCOUNTER — Other Ambulatory Visit: Payer: Self-pay

## 2024-08-30 ENCOUNTER — Encounter
Admission: RE | Disposition: A | Discharge status: Home / Self Care | Payer: Self-pay | Source: Home / Self Care | Attending: General Surgery

## 2024-08-30 ENCOUNTER — Inpatient Hospital Stay: Payer: Self-pay | Admitting: Urgent Care

## 2024-08-30 DIAGNOSIS — K5720 Diverticulitis of large intestine with perforation and abscess without bleeding: Principal | ICD-10-CM | POA: Diagnosis present

## 2024-08-30 DIAGNOSIS — Z59868 Other specified financial insecurity: Secondary | ICD-10-CM

## 2024-08-30 DIAGNOSIS — Z720 Tobacco use: Secondary | ICD-10-CM | POA: Diagnosis present

## 2024-08-30 DIAGNOSIS — Z79899 Other long term (current) drug therapy: Secondary | ICD-10-CM | POA: Diagnosis not present

## 2024-08-30 DIAGNOSIS — D751 Secondary polycythemia: Secondary | ICD-10-CM | POA: Diagnosis present

## 2024-08-30 DIAGNOSIS — Z5948 Other specified lack of adequate food: Secondary | ICD-10-CM

## 2024-08-30 DIAGNOSIS — E669 Obesity, unspecified: Secondary | ICD-10-CM | POA: Diagnosis present

## 2024-08-30 DIAGNOSIS — Z8249 Family history of ischemic heart disease and other diseases of the circulatory system: Secondary | ICD-10-CM | POA: Diagnosis not present

## 2024-08-30 DIAGNOSIS — E119 Type 2 diabetes mellitus without complications: Secondary | ICD-10-CM | POA: Diagnosis present

## 2024-08-30 DIAGNOSIS — Z87891 Personal history of nicotine dependence: Secondary | ICD-10-CM

## 2024-08-30 DIAGNOSIS — M25512 Pain in left shoulder: Secondary | ICD-10-CM | POA: Diagnosis not present

## 2024-08-30 DIAGNOSIS — K5780 Diverticulitis of intestine, part unspecified, with perforation and abscess without bleeding: Secondary | ICD-10-CM | POA: Diagnosis present

## 2024-08-30 DIAGNOSIS — Z9841 Cataract extraction status, right eye: Secondary | ICD-10-CM

## 2024-08-30 DIAGNOSIS — Z5989 Other problems related to housing and economic circumstances: Secondary | ICD-10-CM

## 2024-08-30 DIAGNOSIS — N179 Acute kidney failure, unspecified: Secondary | ICD-10-CM | POA: Diagnosis not present

## 2024-08-30 DIAGNOSIS — K660 Peritoneal adhesions (postprocedural) (postinfection): Secondary | ICD-10-CM | POA: Diagnosis present

## 2024-08-30 DIAGNOSIS — Z01818 Encounter for other preprocedural examination: Secondary | ICD-10-CM

## 2024-08-30 DIAGNOSIS — J449 Chronic obstructive pulmonary disease, unspecified: Secondary | ICD-10-CM | POA: Diagnosis present

## 2024-08-30 DIAGNOSIS — Z825 Family history of asthma and other chronic lower respiratory diseases: Secondary | ICD-10-CM

## 2024-08-30 DIAGNOSIS — Z794 Long term (current) use of insulin: Secondary | ICD-10-CM

## 2024-08-30 DIAGNOSIS — Z59819 Housing instability, housed unspecified: Secondary | ICD-10-CM | POA: Diagnosis not present

## 2024-08-30 DIAGNOSIS — Z9842 Cataract extraction status, left eye: Secondary | ICD-10-CM | POA: Diagnosis not present

## 2024-08-30 DIAGNOSIS — R10A1 Flank pain, right side: Secondary | ICD-10-CM | POA: Diagnosis not present

## 2024-08-30 DIAGNOSIS — Z5941 Food insecurity: Secondary | ICD-10-CM | POA: Diagnosis not present

## 2024-08-30 DIAGNOSIS — Z7901 Long term (current) use of anticoagulants: Secondary | ICD-10-CM | POA: Diagnosis not present

## 2024-08-30 DIAGNOSIS — Z5331 Laparoscopic surgical procedure converted to open procedure: Secondary | ICD-10-CM

## 2024-08-30 DIAGNOSIS — Z5982 Transportation insecurity: Secondary | ICD-10-CM | POA: Diagnosis not present

## 2024-08-30 DIAGNOSIS — I700 Atherosclerosis of aorta: Secondary | ICD-10-CM | POA: Diagnosis present

## 2024-08-30 DIAGNOSIS — K5792 Diverticulitis of intestine, part unspecified, without perforation or abscess without bleeding: Secondary | ICD-10-CM

## 2024-08-30 DIAGNOSIS — Z6837 Body mass index (BMI) 37.0-37.9, adult: Secondary | ICD-10-CM | POA: Diagnosis not present

## 2024-08-30 DIAGNOSIS — I10 Essential (primary) hypertension: Secondary | ICD-10-CM | POA: Diagnosis present

## 2024-08-30 DIAGNOSIS — R5383 Other fatigue: Secondary | ICD-10-CM | POA: Diagnosis present

## 2024-08-30 DIAGNOSIS — I480 Paroxysmal atrial fibrillation: Secondary | ICD-10-CM | POA: Diagnosis present

## 2024-08-30 DIAGNOSIS — Z833 Family history of diabetes mellitus: Secondary | ICD-10-CM | POA: Diagnosis not present

## 2024-08-30 DIAGNOSIS — I959 Hypotension, unspecified: Secondary | ICD-10-CM | POA: Diagnosis not present

## 2024-08-30 DIAGNOSIS — Z961 Presence of intraocular lens: Secondary | ICD-10-CM | POA: Diagnosis present

## 2024-08-30 HISTORY — PX: CYSTOSCOPY WITH INDOCYANINE GREEN IMAGING (ICG): SHX7549

## 2024-08-30 LAB — GLUCOSE, CAPILLARY
Glucose-Capillary: 154 mg/dL — ABNORMAL HIGH (ref 70–99)
Glucose-Capillary: 204 mg/dL — ABNORMAL HIGH (ref 70–99)
Glucose-Capillary: 190 mg/dL — ABNORMAL HIGH (ref 70–99)
Glucose-Capillary: 176 mg/dL — ABNORMAL HIGH (ref 70–99)

## 2024-08-30 LAB — CREATININE, SERUM
Creatinine, Ser: 0.92 mg/dL (ref 0.44–1.00)
GFR, Estimated: >60 mL/min

## 2024-08-30 LAB — ABO/RH: ABO/RH(D): A POS

## 2024-08-30 MED ORDER — FONDAPARINUX SODIUM 2.5 MG/0.5ML ~~LOC~~ SOLN
2.5000 mg | SUBCUTANEOUS | Status: DC
  Administered 2024-08-30 – 2024-09-02 (×4): 2.5 mg via SUBCUTANEOUS
  Filled 2024-08-30 (×4): qty 0.5

## 2024-08-30 MED ORDER — FENTANYL CITRATE (PF) 100 MCG/2ML IJ SOLN
INTRAMUSCULAR | Status: AC
  Filled 2024-08-30: qty 2

## 2024-08-30 MED ORDER — STERILE WATER FOR INJECTION IJ SOLN
INTRAMUSCULAR | Status: DC | PRN
  Administered 2024-08-30: 10 mL via INTRAVENOUS

## 2024-08-30 MED ORDER — ONDANSETRON HCL 4 MG/2ML IJ SOLN
INTRAMUSCULAR | Status: DC | PRN
  Administered 2024-08-30: 4 mg via INTRAVENOUS

## 2024-08-30 MED ORDER — ORAL CARE MOUTH RINSE: 15.0000 mL | Freq: Once | OROMUCOSAL | Status: AC

## 2024-08-30 MED ORDER — AMIODARONE HCL 200 MG PO TABS
200.0000 mg | ORAL_TABLET | Freq: Two times a day (BID) | ORAL | Status: DC
  Administered 2024-08-30 – 2024-08-31 (×3): 200 mg via ORAL
  Filled 2024-08-30 (×4): qty 1

## 2024-08-30 MED ORDER — METOPROLOL SUCCINATE ER 25 MG PO TB24
25.0000 mg | ORAL_TABLET | Freq: Every day | ORAL | Status: DC
  Administered 2024-08-30: 25 mg via ORAL
  Filled 2024-08-30: qty 1

## 2024-08-30 MED ORDER — LIDOCAINE HCL (CARDIAC) PF 100 MG/5ML IV SOSY
PREFILLED_SYRINGE | INTRAVENOUS | Status: DC | PRN
  Administered 2024-08-30: 80 mg via INTRAVENOUS

## 2024-08-30 MED ORDER — IRRISEPT - 450ML BOTTLE WITH 0.05% CHG IN STERILE WATER, USP 99.95% OPTIME
TOPICAL | Status: DC | PRN
  Administered 2024-08-30: 450 mL

## 2024-08-30 MED ORDER — HYDROMORPHONE HCL 1 MG/ML IJ SOLN
INTRAMUSCULAR | Status: AC
  Filled 2024-08-30: qty 1

## 2024-08-30 MED ORDER — CELECOXIB 200 MG PO CAPS
ORAL_CAPSULE | ORAL | Status: AC
  Filled 2024-08-30: qty 1

## 2024-08-30 MED ORDER — GABAPENTIN 300 MG PO CAPS
300.0000 mg | ORAL_CAPSULE | ORAL | Status: AC
  Administered 2024-08-30: 300 mg via ORAL

## 2024-08-30 MED ORDER — SODIUM CHLORIDE 0.9 % IV SOLN
INTRAVENOUS | Status: AC
  Filled 2024-08-30: qty 2

## 2024-08-30 MED ORDER — ALVIMOPAN 12 MG PO CAPS
ORAL_CAPSULE | ORAL | Status: AC
  Filled 2024-08-30: qty 1

## 2024-08-30 MED ORDER — FONDAPARINUX SODIUM 2.5 MG/0.5ML ~~LOC~~ SOLN
2.5000 mg | Freq: Once | SUBCUTANEOUS | Status: AC
  Administered 2024-08-30: 2.5 mg via SUBCUTANEOUS
  Filled 2024-08-30: qty 0.5

## 2024-08-30 MED ORDER — METOPROLOL TARTRATE 37.5 MG PO TABS: 37.5000 mg | ORAL_TABLET | Freq: Two times a day (BID) | ORAL | Status: DC

## 2024-08-30 MED ORDER — INDOCYANINE GREEN 25 MG IJ SOLR
INTRAMUSCULAR | Status: AC
  Filled 2024-08-30: qty 10

## 2024-08-30 MED ORDER — INSULIN ASPART 100 UNIT/ML IJ SOLN
0.0000 [IU] | Freq: Three times a day (TID) | INTRAMUSCULAR | Status: DC
  Administered 2024-08-30: 5 [IU] via SUBCUTANEOUS
  Administered 2024-08-30 – 2024-08-31 (×3): 3 [IU] via SUBCUTANEOUS
  Administered 2024-08-31: 2 [IU] via SUBCUTANEOUS
  Administered 2024-09-01: 3 [IU] via SUBCUTANEOUS
  Administered 2024-09-01: 5 [IU] via SUBCUTANEOUS
  Administered 2024-09-01 – 2024-09-02 (×3): 2 [IU] via SUBCUTANEOUS
  Administered 2024-09-02 – 2024-09-03 (×2): 3 [IU] via SUBCUTANEOUS
  Filled 2024-08-30: qty 3
  Filled 2024-08-30: qty 1
  Filled 2024-08-30: qty 2
  Filled 2024-08-30: qty 5
  Filled 2024-08-30: qty 2
  Filled 2024-08-30 (×2): qty 3
  Filled 2024-08-30: qty 2
  Filled 2024-08-30 (×2): qty 3
  Filled 2024-08-30: qty 2

## 2024-08-30 MED ORDER — INSULIN ASPART 100 UNIT/ML IJ SOLN
INTRAMUSCULAR | Status: AC
  Filled 2024-08-30: qty 5

## 2024-08-30 MED ORDER — OXYCODONE HCL 5 MG/5ML PO SOLN: 5.0000 mg | Freq: Once | ORAL | Status: DC | PRN

## 2024-08-30 MED ORDER — FENTANYL CITRATE (PF) 100 MCG/2ML IJ SOLN
INTRAMUSCULAR | Status: DC | PRN
  Administered 2024-08-30 (×2): 50 ug via INTRAVENOUS

## 2024-08-30 MED ORDER — PHENYLEPHRINE 80 MCG/ML (10ML) SYRINGE FOR IV PUSH (FOR BLOOD PRESSURE SUPPORT)
PREFILLED_SYRINGE | INTRAVENOUS | Status: DC | PRN
  Administered 2024-08-30 (×4): 80 ug via INTRAVENOUS
  Administered 2024-08-30: 160 ug via INTRAVENOUS

## 2024-08-30 MED ORDER — CHLORHEXIDINE GLUCONATE 0.12 % MT SOLN
OROMUCOSAL | Status: AC
  Filled 2024-08-30: qty 15

## 2024-08-30 MED ORDER — ONDANSETRON HCL 4 MG/2ML IJ SOLN: 4.0000 mg | Freq: Once | INTRAMUSCULAR | Status: DC | PRN

## 2024-08-30 MED ORDER — SODIUM CHLORIDE 0.9 % IV SOLN: INTRAVENOUS | Status: DC

## 2024-08-30 MED ORDER — CELECOXIB 200 MG PO CAPS
200.0000 mg | ORAL_CAPSULE | Freq: Two times a day (BID) | ORAL | Status: DC
  Administered 2024-08-30 – 2024-09-03 (×8): 200 mg via ORAL
  Filled 2024-08-30 (×8): qty 1

## 2024-08-30 MED ORDER — ACETAMINOPHEN 500 MG PO TABS
ORAL_TABLET | ORAL | Status: AC
  Filled 2024-08-30: qty 2

## 2024-08-30 MED ORDER — ACETAMINOPHEN 500 MG PO TABS
1000.0000 mg | ORAL_TABLET | ORAL | Status: AC
  Administered 2024-08-30: 1000 mg via ORAL

## 2024-08-30 MED ORDER — CHLORHEXIDINE GLUCONATE 0.12 % MT SOLN
15.0000 mL | Freq: Once | OROMUCOSAL | Status: AC
  Administered 2024-08-30: 15 mL via OROMUCOSAL

## 2024-08-30 MED ORDER — PROPOFOL 10 MG/ML IV BOLUS
INTRAVENOUS | Status: AC
  Filled 2024-08-30: qty 40

## 2024-08-30 MED ORDER — OXYCODONE HCL 5 MG PO TABS: 5.0000 mg | ORAL_TABLET | Freq: Once | ORAL | Status: DC | PRN

## 2024-08-30 MED ORDER — SODIUM CHLORIDE 0.9 % IV SOLN
2.0000 g | INTRAVENOUS | Status: AC
  Administered 2024-08-30: 2 g via INTRAVENOUS

## 2024-08-30 MED ORDER — KETAMINE HCL 50 MG/5ML IJ SOSY
PREFILLED_SYRINGE | INTRAMUSCULAR | Status: AC
  Filled 2024-08-30: qty 5

## 2024-08-30 MED ORDER — SUGAMMADEX SODIUM 200 MG/2ML IV SOLN
INTRAVENOUS | Status: DC | PRN
  Administered 2024-08-30: 200 mg via INTRAVENOUS
  Administered 2024-08-30: 100 mg via INTRAVENOUS

## 2024-08-30 MED ORDER — PROPOFOL 10 MG/ML IV BOLUS
INTRAVENOUS | Status: DC | PRN
  Administered 2024-08-30: 50 mg via INTRAVENOUS
  Administered 2024-08-30: 150 mg via INTRAVENOUS

## 2024-08-30 MED ORDER — MIDAZOLAM HCL (PF) 2 MG/2ML IJ SOLN
INTRAMUSCULAR | Status: DC | PRN
  Administered 2024-08-30: 2 mg via INTRAVENOUS

## 2024-08-30 MED ORDER — MORPHINE SULFATE (PF) 4 MG/ML IV SOLN: 4.0000 mg | INTRAVENOUS | Status: DC | PRN

## 2024-08-30 MED ORDER — FENTANYL CITRATE (PF) 100 MCG/2ML IJ SOLN
25.0000 ug | INTRAMUSCULAR | Status: DC | PRN
  Administered 2024-08-30 (×2): 25 ug via INTRAVENOUS

## 2024-08-30 MED ORDER — LIDOCAINE HCL (PF) 2 % IJ SOLN
INTRAMUSCULAR | Status: AC
  Filled 2024-08-30: qty 5

## 2024-08-30 MED ORDER — GABAPENTIN 300 MG PO CAPS
ORAL_CAPSULE | ORAL | Status: AC
  Filled 2024-08-30: qty 1

## 2024-08-30 MED ORDER — DEXAMETHASONE SOD PHOSPHATE PF 10 MG/ML IJ SOLN
INTRAMUSCULAR | Status: DC | PRN
  Administered 2024-08-30: 4 mg via INTRAVENOUS

## 2024-08-30 MED ORDER — BUPIVACAINE-EPINEPHRINE (PF) 0.25% -1:200000 IJ SOLN
INTRAMUSCULAR | Status: AC
  Filled 2024-08-30: qty 60

## 2024-08-30 MED ORDER — GABAPENTIN 300 MG PO CAPS
300.0000 mg | ORAL_CAPSULE | Freq: Two times a day (BID) | ORAL | Status: DC
  Administered 2024-08-30 – 2024-09-03 (×8): 300 mg via ORAL
  Filled 2024-08-30 (×8): qty 1

## 2024-08-30 MED ORDER — ONDANSETRON HCL 4 MG/2ML IJ SOLN: 4.0000 mg | Freq: Four times a day (QID) | INTRAMUSCULAR | Status: DC | PRN

## 2024-08-30 MED ORDER — BUPIVACAINE LIPOSOME 1.3 % IJ SUSP: 20.0000 mL | Freq: Once | INTRAMUSCULAR | Status: DC

## 2024-08-30 MED ORDER — HYDROMORPHONE HCL 1 MG/ML IJ SOLN
INTRAMUSCULAR | Status: DC | PRN
  Administered 2024-08-30: 1 mg via INTRAVENOUS

## 2024-08-30 MED ORDER — CELECOXIB 200 MG PO CAPS
200.0000 mg | ORAL_CAPSULE | ORAL | Status: AC
  Administered 2024-08-30: 200 mg via ORAL

## 2024-08-30 MED ORDER — MIDAZOLAM HCL 2 MG/2ML IJ SOLN
INTRAMUSCULAR | Status: AC
  Filled 2024-08-30: qty 2

## 2024-08-30 MED ORDER — ONDANSETRON HCL 4 MG/2ML IJ SOLN
INTRAMUSCULAR | Status: AC
  Filled 2024-08-30: qty 2

## 2024-08-30 MED ORDER — ROCURONIUM BROMIDE 100 MG/10ML IV SOLN
INTRAVENOUS | Status: DC | PRN
  Administered 2024-08-30: 10 mg via INTRAVENOUS
  Administered 2024-08-30: 50 mg via INTRAVENOUS
  Administered 2024-08-30 (×2): 20 mg via INTRAVENOUS
  Administered 2024-08-30 (×2): 10 mg via INTRAVENOUS

## 2024-08-30 MED ORDER — DEXAMETHASONE SOD PHOSPHATE PF 10 MG/ML IJ SOLN
INTRAMUSCULAR | Status: AC
  Filled 2024-08-30: qty 1

## 2024-08-30 MED ORDER — PANTOPRAZOLE SODIUM 40 MG IV SOLR
40.0000 mg | Freq: Every day | INTRAVENOUS | Status: DC
  Administered 2024-08-30 – 2024-09-02 (×4): 40 mg via INTRAVENOUS
  Filled 2024-08-30 (×4): qty 10

## 2024-08-30 MED ORDER — HYDROCODONE-ACETAMINOPHEN 5-325 MG PO TABS
1.0000 | ORAL_TABLET | ORAL | Status: DC | PRN
  Administered 2024-08-30 – 2024-08-31 (×2): 2 via ORAL
  Filled 2024-08-30 (×2): qty 2

## 2024-08-30 MED ORDER — LACTATED RINGERS IV SOLN: INTRAVENOUS | Status: DC | PRN

## 2024-08-30 MED ORDER — ROCURONIUM BROMIDE 10 MG/ML (PF) SYRINGE
PREFILLED_SYRINGE | INTRAVENOUS | Status: AC
  Filled 2024-08-30: qty 10

## 2024-08-30 MED ORDER — SODIUM CHLORIDE 0.9 % IR SOLN
Status: DC | PRN
  Administered 2024-08-30: 3000 mL

## 2024-08-30 MED ORDER — KETAMINE HCL 50 MG/5ML IJ SOSY
PREFILLED_SYRINGE | INTRAMUSCULAR | Status: DC | PRN
  Administered 2024-08-30: 20 mg via INTRAVENOUS

## 2024-08-30 MED ORDER — ONDANSETRON 4 MG PO TBDP: 4.0000 mg | ORAL_TABLET | Freq: Four times a day (QID) | ORAL | Status: DC | PRN

## 2024-08-30 MED ORDER — PHENYLEPHRINE 80 MCG/ML (10ML) SYRINGE FOR IV PUSH (FOR BLOOD PRESSURE SUPPORT)
PREFILLED_SYRINGE | INTRAVENOUS | Status: AC
  Filled 2024-08-30: qty 10

## 2024-08-30 MED ORDER — ALVIMOPAN 12 MG PO CAPS
12.0000 mg | ORAL_CAPSULE | Freq: Two times a day (BID) | ORAL | Status: DC
  Administered 2024-08-31: 12 mg via ORAL
  Filled 2024-08-30: qty 1

## 2024-08-30 MED ORDER — BUPIVACAINE-EPINEPHRINE (PF) 0.25% -1:200000 IJ SOLN
INTRAMUSCULAR | Status: DC | PRN
  Administered 2024-08-30: 60 mL

## 2024-08-30 MED ORDER — ALVIMOPAN 12 MG PO CAPS
12.0000 mg | ORAL_CAPSULE | ORAL | Status: AC
  Administered 2024-08-30: 12 mg via ORAL

## 2024-08-30 NOTE — Op Note (Signed)
 Preoperative diagnosis: History of complicated diverticulitis  Postoperative diagnosis: History of complicated diverticulitis   Procedure: Attempted Robotic assisted laparoscopic converted to open sigmoid colectomy. Appendectomy   Anesthesia: GETA   Surgeon: [name], MD  Assistant: Fidela Fret, PA-C    Wound Classification: Contaminated   Specimen: Sigmoid colon, Appendix   Complications: None   Estimated Blood Loss: 100 mL  Indications: Patient is a 69 y.o. female with history of perforated diverticulitis with abscess back in November treated with percutaneous drainage and antibiotic therapy.   FIndings: 1.  Persistent active phlegmon of the right lower quadrant 2.  Appendix was completely adhered to the diseased sigmoid colon.  Appendectomy indicated.   Description of procedure: The patient was placed on the operating table in the lithotomy position, both arms tucked. General anesthesia was induced. A time-out was completed verifying correct patient, procedure, site, positioning, and implant(s) and/or special equipment prior to beginning this procedure. The abdomen was prepped and draped in the usual sterile fashion.    A Veress needle was inserted on Palmer's point.  Abdominal cavity was insufflated to 15 mmHg. Patient tolerated insufflation well.  An 8 mm port was inserted in an Optiview fashion in the right upper quadrant.  Two additional 8 mm ports and one 12 mm port were inserted under direct visualization along the right side of the abdominal wall. 5 mm assistant port was then placed on the right subcostal area.  No injuries from trocar placements were noted. The table was placed in the Trendelenburg position.  Xi robotic platform was then brought to the operative field and docked at an angle from the left lower quadrant.  Tip up grasper and fenestrated bipolar were placed in the left arm ports.  Scissors were placed in right arm port.   A very  difficult time-consuming lysis of adhesions was needed to be done to be able to identify the sigmoid colon.  Bunch of small bowel and cecum was at the here to the right lower quadrant and pelvic abdominal wall.  A large chronic phlegmonous process was opened and suctioned.  After cleaning the phlegmonous cavity, trying to identify what was proximal sigmoid and rectum was so hard that I decided to convert the surgery to open partial colectomy.  Robot was undocked.  A vertical midline incision was made from supraumbilical area to just above the pubis. This was deepened through the subcutaneous tissues and hemostasis was achieved with electrocautery. The linea alba was identified and incised and the peritoneal cavity entered. Patient was repositioned in a slight Trendelenburg position.  A very hard, chronically diseased distal rectum was identified.  Again a very difficult and time-consuming dissection of the sigmoid colon with Metzenbaum scissors and finger fracture was done.  Using electrocautery the colon was freed from its peritoneal attachments along the line of Toldt proximally from the splenic flexure and distally to the rectosigmoid junction. Both ureters were identified and protected.  Attention was then turned to mobilizing the splenic flexure. The peritoneum covering the splenic flexure was dissected using electrocautery. The splenocolic ligament and the pancreaticocolic ligaments were divided. The distal transverse colon was dissected from the stomach by freeing the greater omentum.  Points of transection were selected proximally and distally at the rectum. The bowel was divided with the Contour stapler. The peritoneum on the medial aspect of mesentery was then scored with electrocautery and the vessels were serially ligated and divided with LigaSure. The location of the left ureter was confirmed prior to dividing the  IMA. Dissection continued to the sigmoid artery which was divided and ligated with 2-0  silk. The rectum was mobilized and the loose tissues in the presacral space were sharply dissected with electrocautery distal to the level of the sacral promontory.  The specimen was removed and sent to pathology. Hemostasis was checked in the operative field. The two ends of the bowel were checked and found to be viable, with excellent blood supply.  Attention was turned to performing a coloproctostomy with the EEA stapler. The staple line from the distal colon was sharply excised and edges were grasped with Allis clamps. The diameter of EEA to be used was measured with EEA sizers. A purse-string suture was placed around the distal colon using 0 Prolene. The anvil was inserted into the distal colon and the purse-string was tied down. The EEA stapler was passed via the anus. The spike was deployed through the rectum at the anterior face and the EEA stapler was mated with the anvil. The stapler was tightened down and fired creating and end-to-side anastomosis. Once the stapler along with the anvil had been removed, two intact donut anastomoses were visualized. A leak test was performed and no leaks were found.  The anastomosis was checked and found to be intact and widely patent.  The abdominal cavity was then copiously irrigated and hemostasis was checked.  A 15 French drain was left in the pelvis and right lower quadrant due to the active phlegmonous process. I decided to proceed with a loop ileostomy due to the contamination and difficulty of the low pelvic anastomosis. Attention was turned to the cecum, in order to identify the terminal ileum. An appropriate segment of distal ileum with adequate mesenteric length was selected. Next, a Kelly clamp was used to carefully dissect a small hole in the associated mesentery just below the apex of the loop, in order to thread a Penrose drain to hold the loop. Marking stitches were placed in order to identify the proximal and distal ends of the ostomy.  A circular  disc of skin and subcutaneous tissue was excised at the right lower quadrant. Hemostasis was achieved, and the underlying fascia was incised in order to allow two fingerbreadths of space. With the guidance of the penrose, and care not to disturb the correct orientation of the ends of the ileostomy, the loop was delivered through the ostomy site.   Using a clean closure genic and after checking that the mesentery was not under tension, and that hemostasis was achieved, the midline abdominal incision was closed. First, the fascia was reapproximated with 0 running PDS, and skin closed with skin staples.  The wound was protected with towels to prevent contamination.  The Penrose drain was exchanged for a plastic rod to support the ostomy. A four-fifths circumferential incision was made toward the proximal aspect of the ileal loop, leaving at least one centimeter of space from the incision to the skin edge. After removing the marking stitches, the cut edges were everted to create a spigot shape of the ostomy. The bowel edges were sutured to the surrounding skin by taking full-thickness bites of the edges of the ileum, then to a seromuscular bite at the base of the ostomy, and then to the dermis of the skin. This was carried out around the complete circumference of the ostomy.  The ostomy appliance was placed, and the midline wound was dressed with a sterile bandage. The patient was awoken from anesthesia and taken to the recovery room in stable condition.  All instruments, needles, and towels had been accounted for.  Patient tolerated the procedure well and transferred to recovery unit.

## 2024-08-30 NOTE — Anesthesia Postprocedure Evaluation (Signed)
"   Anesthesia Post Note  Patient: Yolanda Gray  Procedure(s) Performed: COLECTOMY, PARTIAL, ROBOT-ASSISTED, LAPAROSCOPIC (Abdomen) CYSTOSCOPY WITH INDOCYANINE GREEN  IMAGING (ICG)  Anesthesia Type: General Anesthetic complications: no   No notable events documented.   Last Vitals:  Vitals:   08/30/24 1335 08/30/24 1340  BP:    Pulse: 69 69  Resp: 15 17  Temp:    SpO2: 98% 99%    Last Pain:  Vitals:   08/30/24 0614  TempSrc: Temporal  PainSc: 0-No pain                 VAN STAVEREN,Ervine Witucki      "

## 2024-08-30 NOTE — Op Note (Signed)
 Date of procedure: 08/30/24  Preoperative diagnosis:  Diverticulitis Request for ureteral ICG  Postoperative diagnosis:  Same  Procedure: Cystoscopy, bilateral ureteral instillation of ICG  Surgeon: [name], MD  Anesthesia: General  Complications: None  Intraoperative findings:  Normal cystoscopy, uncomplicated bilateral ureteral ICG  EBL: None for urology portion  Specimens: None for urology portion  Drains: 16 French Foley  Indication: Yolanda Gray is a 69 y.o. patient with diverticulitis and abscess, here today for partial robotic colectomy with general surgery and ureteral ICG was requested to aid in intraoperative identification.  After reviewing the management options for treatment, they elected to proceed with the above surgical procedure(s). We have discussed the potential benefits and risks of the procedure, side effects of the proposed treatment, the likelihood of the patient achieving the goals of the procedure, and any potential problems that might occur during the procedure or recuperation. Informed consent has been obtained.  Description of procedure:  The patient was taken to the operating room and general anesthesia was induced. SCDs were placed for DVT prophylaxis.. The patient was placed in the dorsal lithotomy position, prepped and draped in the usual sterile fashion, and preoperative antibiotics were administered. A preoperative time-out was performed.   A 21 French rigid cystoscope was used to intubate the urethra and thorough cystoscopy was performed.  The bladder was grossly normal, and the ureteral orifices orthotopic bilaterally.  A 5 French access catheter was used to intubate the left ureteral orifice and advanced to the mid ureter, 10 mL ICG was gently instilled.  I turned my attention to the right ureteral orifice and an identical procedure was performed.  A 16 French Foley was placed with return of green-colored fluid, and 10 mL were placed  in the balloon.  Disposition: Stable to PACU  Plan: Foley can be removed at general surgery discretion  [name], MD

## 2024-08-30 NOTE — Transfer of Care (Signed)
 Immediate Anesthesia Transfer of Care Note  Patient: Yolanda Gray  Procedure(s) Performed: COLECTOMY, PARTIAL, ROBOT-ASSISTED, LAPAROSCOPIC (Abdomen) CYSTOSCOPY WITH INDOCYANINE GREEN  IMAGING (ICG)  Patient Location: PACU  Anesthesia Type:General  Level of Consciousness: sedated  Airway & Oxygen Therapy: Patient Spontanous Breathing  Post-op Assessment: Report given to RN  Post vital signs: Reviewed and stable  Last Vitals:  Vitals Value Taken Time  BP 116/45 08/30/24 13:30  Temp    Pulse 70 08/30/24 13:33  Resp 16 08/30/24 13:33  SpO2 99 % 08/30/24 13:33  Vitals shown include unfiled device data.  Last Pain:  Vitals:   08/30/24 0614  TempSrc: Temporal  PainSc: 0-No pain         Complications: No notable events documented.

## 2024-08-30 NOTE — Anesthesia Preprocedure Evaluation (Signed)
 "                                  Anesthesia Evaluation  Patient identified by MRN, date of birth, ID band Patient awake    Reviewed: Allergy & Precautions, NPO status , Patient's Chart, lab work & pertinent test results  Airway Mallampati: II  TM Distance: >3 FB Neck ROM: full    Dental  (+) Edentulous Upper, Edentulous Lower   Pulmonary neg pulmonary ROS, COPD,  COPD inhaler, Patient abstained from smoking., former smoker   Pulmonary exam normal  + decreased breath sounds      Cardiovascular Exercise Tolerance: Good negative cardio ROS Normal cardiovascular exam+ dysrhythmias Atrial Fibrillation  Rhythm:Regular Rate:Normal     Neuro/Psych negative neurological ROS  negative psych ROS   GI/Hepatic negative GI ROS, Neg liver ROS,,,  Endo/Other  negative endocrine ROSdiabetes, Well Controlled, Type 1, Insulin  Dependent  Class 3 obesity  Renal/GU   negative genitourinary   Musculoskeletal negative musculoskeletal ROS (+)    Abdominal  (+) + obese  Peds negative pediatric ROS (+)  Hematology negative hematology ROS (+)   Anesthesia Other Findings Past Medical History: No date: COPD (chronic obstructive pulmonary disease) (HCC) No date: Diabetes mellitus without complication (HCC) No date: Diverticulitis of large intestine with perforation without  bleeding No date: Edentulous No date: Morbid obesity with BMI of 40.0-44.9, adult (HCC) No date: Obesity (BMI 30-39.9) No date: Polycythemia No date: Thrombocytopenia No date: Tobacco abuse No date: Tobacco abuse  Past Surgical History: 10/09/2023: CATARACT EXTRACTION W/PHACO; Left     Comment:  Procedure: CATARACT EXTRACTION PHACO AND INTRAOCULAR               LENS PLACEMENT (IOC) LEFT MALYUGIN OMIDRIA  13.44 01:09.5;              Surgeon: Enola Feliciano Hugger, MD;  Location: [facility]               SURGERY CNTR;  Service: Ophthalmology;  Laterality: Left; 10/30/2023: CATARACT EXTRACTION W/PHACO;  Right     Comment:  Procedure: PHACOEMULSIFICATION, CATARACT, WITH IOL               INSERTION 6.65, 00:53.8;  Surgeon: Enola Feliciano Hugger, MD;  Location: [facility];  Service:               Ophthalmology;  Laterality: Right; No date: CESAREAN SECTION 08/25/2024: COLONOSCOPY; N/A     Comment:  Procedure: COLONOSCOPY;  Surgeon: Tye Millet, DO;                Location: [facility] ENDOSCOPY;  Service: General;  Laterality:              N/A; No date: finger laceration 08/25/2024: POLYPECTOMY     Comment:  Procedure: POLYPECTOMY, INTESTINE;  Surgeon: Tye Millet, DO;  Location: [facility] ENDOSCOPY;  Service: General;; No date: TONSILLECTOMY     Reproductive/Obstetrics negative OB ROS                              Anesthesia Physical Anesthesia Plan  ASA: 3  Anesthesia Plan: General   Post-op Pain Management:    Induction: Intravenous  PONV Risk Score and  Plan: Ondansetron , Dexamethasone , Midazolam  and Treatment may vary due to age or medical condition  Airway Management Planned: Oral ETT  Additional Equipment:   Intra-op Plan:   Post-operative Plan: Extubation in OR  Informed Consent: I have reviewed the patients History and Physical, chart, labs and discussed the procedure including the risks, benefits and alternatives for the proposed anesthesia with the patient or authorized representative who has indicated his/her understanding and acceptance.     Dental Advisory Given  Plan Discussed with: CRNA  Anesthesia Plan Comments:          Anesthesia Quick Evaluation  "

## 2024-08-30 NOTE — H&P (Signed)
" ° °  08/30/24 7:05 AM   Yolanda Gray 1956/08/04 [id number]  CC: Diverticulitis  HPI: 69 year old female with diverticulitis here for partial robotic colectomy with general surgery, urology requested for ureteral ICG to aid in intraoperative ureteral identification.   PMH: Past Medical History:  Diagnosis Date   COPD (chronic obstructive pulmonary disease) (HCC)    Diabetes mellitus without complication (HCC)    Diverticulitis of large intestine with perforation without bleeding    Edentulous    Morbid obesity with BMI of 40.0-44.9, adult (HCC)    Obesity (BMI 30-39.9)    Polycythemia    Thrombocytopenia    Tobacco abuse    Tobacco abuse     Surgical History: Past Surgical History:  Procedure Laterality Date   CATARACT EXTRACTION W/PHACO Left 10/09/2023   Procedure: CATARACT EXTRACTION PHACO AND INTRAOCULAR LENS PLACEMENT (IOC) LEFT MALYUGIN OMIDRIA  13.44 01:09.5;  Surgeon: Enola Feliciano Hugger, MD;  Location: [facility] SURGERY [facility]TR;  Service: Ophthalmology;  Laterality: Left;   CATARACT EXTRACTION W/PHACO Right 10/30/2023   Procedure: PHACOEMULSIFICATION, CATARACT, WITH IOL INSERTION 6.65, 00:53.8;  Surgeon: Enola Feliciano Hugger, MD;  Location: [facility] SURGERY CNTR;  Service: Ophthalmology;  Laterality: Right;   CESAREAN SECTION     COLONOSCOPY N/A 08/25/2024   Procedure: COLONOSCOPY;  Surgeon: Tye Millet, DO;  Location: [facility] ENDOSCOPY;  Service: General;  Laterality: N/A;   finger laceration     POLYPECTOMY  08/25/2024   Procedure: POLYPECTOMY, INTESTINE;  Surgeon: Tye Millet, DO;  Location: [facility] ENDOSCOPY;  Service: General;;   TONSILLECTOMY        Family History: Family History  Problem Relation Age of Onset   Asthma Mother    Heart attack Mother    Heart attack Father     Social History:  reports that she quit smoking about 4 years ago. Her smoking use included cigarettes. She has never used smokeless tobacco. She reports that she does not currently use  alcohol. She reports that she does not use drugs.  Physical Exam: BP (!) 155/68   Pulse 82   Temp 97.7 F (36.5 C) (Temporal)   Resp 18   SpO2 96%    Constitutional:  Alert and oriented, No acute distress. Cardiovascular: Regular rate and rhythm Respiratory: Clear to auscultation bilaterally GI: Abdomen is soft, nontender, nondistended, no abdominal masses   Pertinent Imaging: I have personally viewed and interpreted the CT from November 2025 with no hydronephrosis.  Assessment & Plan:   70 year old female with diverticulitis here today for robotic partial colectomy with general surgery, urology requested to place ureteral ICG to aid in intraoperative identification.  Risks and benefits discussed including bleeding, infection, ureteral injury, acute kidney injury, hematuria, need for potential additional procedures or ureteral injury requiring more extensive repair.  Bilateral ureteral ICG at time of general surgery procedure today   [name], MD 08/30/2024  [facility] [address], [address] [place], [place] [address] [phone]   "

## 2024-08-30 NOTE — Anesthesia Procedure Notes (Signed)
 Procedure Name: Intubation Date/Time: 08/30/2024 7:39 AM  Performed by: Veronica Alm BROCKS, CRNAPre-anesthesia Checklist: Patient identified, Emergency Drugs available, Suction available and Patient being monitored Patient Re-evaluated:Patient Re-evaluated prior to induction Oxygen Delivery Method: Circle system utilized Preoxygenation: Pre-oxygenation with 100% oxygen Induction Type: IV induction Ventilation: Mask ventilation without difficulty Laryngoscope Size: McGrath and 3 Grade View: Grade I Tube type: Oral Tube size: 7.5 mm Number of attempts: 1 Airway Equipment and Method: Stylet and Oral airway Placement Confirmation: ETT inserted through vocal cords under direct vision, positive ETCO2 and breath sounds checked- equal and bilateral Secured at: 20 cm Tube secured with: Tape Dental Injury: Teeth and Oropharynx as per pre-operative assessment

## 2024-08-30 NOTE — Interval H&P Note (Signed)
 History and Physical Interval Note:  08/30/2024 7:17 AM  Yolanda Gray  has presented today for surgery, with the diagnosis of K57.20 diverticulitis of large intestine w/ perforation w/o bleeding.  The various methods of treatment have been discussed with the patient and family. After consideration of risks, benefits and other options for treatment, the patient has consented to  Procedures: COLECTOMY, PARTIAL, ROBOT-ASSISTED, LAPAROSCOPIC (N/A) CYSTOSCOPY WITH INDOCYANINE GREEN  IMAGING (ICG) (N/A) as a surgical intervention.  The patient's history has been reviewed, patient examined, no change in status, stable for surgery.  I have reviewed the patient's chart and labs.  Questions were answered to the patient's satisfaction.     Yolanda Gray

## 2024-08-31 ENCOUNTER — Encounter: Payer: Self-pay | Admitting: General Surgery

## 2024-08-31 ENCOUNTER — Other Ambulatory Visit (HOSPITAL_COMMUNITY): Payer: Self-pay

## 2024-08-31 ENCOUNTER — Telehealth (HOSPITAL_COMMUNITY): Payer: Self-pay

## 2024-08-31 LAB — CBC
HCT: 31 % — ABNORMAL LOW (ref 36.0–46.0)
Hemoglobin: 9.3 g/dL — ABNORMAL LOW (ref 12.0–15.0)
MCH: 26.5 pg (ref 26.0–34.0)
MCHC: 30 g/dL (ref 30.0–36.0)
MCV: 88.3 fL (ref 80.0–100.0)
Platelets: 332 K/uL (ref 150–400)
RBC: 3.51 MIL/uL — ABNORMAL LOW (ref 3.87–5.11)
RDW: 14.9 % (ref 11.5–15.5)
WBC: 13.2 K/uL — ABNORMAL HIGH (ref 4.0–10.5)
nRBC: 0 % (ref 0.0–0.2)

## 2024-08-31 LAB — BASIC METABOLIC PANEL WITH GFR
Anion gap: 11 (ref 5–15)
BUN: 16 mg/dL (ref 8–23)
CO2: 24 mmol/L (ref 22–32)
Calcium: 8.4 mg/dL — ABNORMAL LOW (ref 8.9–10.3)
Chloride: 101 mmol/L (ref 98–111)
Creatinine, Ser: 1.26 mg/dL — ABNORMAL HIGH (ref 0.44–1.00)
GFR, Estimated: 47 mL/min — ABNORMAL LOW
Glucose, Bld: 124 mg/dL — ABNORMAL HIGH (ref 70–99)
Potassium: 4.4 mmol/L (ref 3.5–5.1)
Sodium: 136 mmol/L (ref 135–145)

## 2024-08-31 LAB — GLUCOSE, CAPILLARY
Glucose-Capillary: 169 mg/dL — ABNORMAL HIGH (ref 70–99)
Glucose-Capillary: 175 mg/dL — ABNORMAL HIGH (ref 70–99)
Glucose-Capillary: 135 mg/dL — ABNORMAL HIGH (ref 70–99)
Glucose-Capillary: 156 mg/dL — ABNORMAL HIGH (ref 70–99)

## 2024-08-31 MED ORDER — AMIODARONE HCL 200 MG PO TABS: 200.0000 mg | ORAL_TABLET | Freq: Every day | ORAL | Status: DC

## 2024-08-31 MED ORDER — LOPERAMIDE HCL 2 MG PO CAPS: 2.0000 mg | ORAL_CAPSULE | Freq: Three times a day (TID) | ORAL | Status: DC

## 2024-08-31 MED ORDER — SODIUM CHLORIDE 0.9 % IV BOLUS
500.0000 mL | Freq: Once | INTRAVENOUS | Status: AC
  Administered 2024-08-31: 500 mL via INTRAVENOUS

## 2024-08-31 MED ORDER — HYDROCODONE-ACETAMINOPHEN 5-325 MG PO TABS
1.0000 | ORAL_TABLET | ORAL | Status: DC | PRN
  Administered 2024-08-31 – 2024-09-03 (×5): 1 via ORAL
  Filled 2024-08-31 (×5): qty 1

## 2024-08-31 MED ORDER — AMIODARONE HCL 200 MG PO TABS
200.0000 mg | ORAL_TABLET | Freq: Two times a day (BID) | ORAL | Status: DC
  Administered 2024-08-31 – 2024-09-03 (×6): 200 mg via ORAL
  Filled 2024-08-31 (×6): qty 1

## 2024-08-31 MED ORDER — IPRATROPIUM-ALBUTEROL 0.5-2.5 (3) MG/3ML IN SOLN: 3.0000 mL | Freq: Four times a day (QID) | RESPIRATORY_TRACT | Status: DC | PRN

## 2024-08-31 MED ORDER — SODIUM CHLORIDE 0.9 % IV SOLN: INTRAVENOUS | Status: DC

## 2024-08-31 MED ORDER — LOPERAMIDE HCL 2 MG PO CAPS
2.0000 mg | ORAL_CAPSULE | Freq: Two times a day (BID) | ORAL | Status: DC
  Administered 2024-08-31: 2 mg via ORAL
  Filled 2024-08-31: qty 1

## 2024-08-31 NOTE — Telephone Encounter (Signed)
 Pharmacy Patient Advocate Encounter  Insurance verification completed.    The patient is insured through [employer]. Patient has Medicare and is not eligible for a copay card, but may be able to apply for patient assistance or Medicare RX Payment Plan (Patient Must reach out to their plan, if eligible for payment plan), if available.    Ran test claim for Eliquis 5mg  tablet and the current 30 day co-pay is $249 due to deductible.   This test claim was processed through [facility]- copay amounts may vary at other pharmacies due to pharmacy/plan contracts, or as the patient moves through the different stages of their insurance plan.

## 2024-08-31 NOTE — Progress Notes (Addendum)
 [facility]- General Surgery  SURGICAL PROGRESS NOTE  Hospital Day(s): 1.   Post op day(s): 1 Day Post-Op.   Interval History:  Patient is status post a 1 open sigmoidectomy with diverting loop ileostomy and appendectomy.  Patient overall appears comfortable this morning.  Reports generalized abdominal discomfort. Has been tolerating clear liquid diet, denies nausea or vomiting. Gastric content noted in ileostomy bag. Patient experienced flatus while in the room this morning. 24-hour recorded output in drain: 85 cc  Vital signs in last 24 hours: Min-max current  Temp:  [97.1 F (36.2 C)-97.8 F (36.6 C)] 97.5 F (36.4 C) (01/20 0718) Pulse Rate:  [67-85] 85 (01/20 0718) Resp:  [11-19] 15 (01/20 0718) BP: (92-128)/(45-80) 92/54 (01/20 0718) SpO2:  [94 %-100 %] 97 % (01/20 0718)             Intake/Output last 2 shifts:  01/19 0701 - 01/20 0700 In: 2860 [P.O.:1060; I.V.:1700; IV Piggyback:100] Out: 665 [Urine:545; Drains:85; Stool:10; Blood:25]   Physical Exam:  Constitutional: alert, cooperative and no distress  Respiratory: breathing non-labored at rest  Cardiovascular: regular rate and sinus rhythm  Gastrointestinal: soft, generalized tenderness, and non-distended, low gastric content and ileostomy with gas. Serosanguineous output in JP drain. Surgical incisions are clean and dry.  Honeycomb dressing intact on midline incision.  Labs:     Latest Ref Rng & Units 08/31/2024    4:10 AM 08/27/2024    9:56 AM 06/26/2024    4:38 AM  CBC  WBC 4.0 - 10.5 K/uL 13.2  9.1  10.6   Hemoglobin 12.0 - 15.0 g/dL 9.3  89.2  88.6   Hematocrit 36.0 - 46.0 % 31.0  34.5  35.4   Platelets 150 - 400 K/uL 332  305  356       Latest Ref Rng & Units 08/31/2024    4:10 AM 08/30/2024    4:22 PM 06/26/2024    4:38 AM  CMP  Glucose 70 - 99 mg/dL 875   860   BUN 8 - 23 mg/dL 16   15   Creatinine 9.55 - 1.00 mg/dL 8.73  9.07  9.14   Sodium 135 - 145 mmol/L 136   131   Potassium 3.5 - 5.1 mmol/L  4.4   3.6   Chloride 98 - 111 mmol/L 101   93   CO2 22 - 32 mmol/L 24   27   Calcium 8.9 - 10.3 mg/dL 8.4   8.5     Imaging studies: No new pertinent imaging studies   Assessment/Plan:  69 y.o. female with complicated diverticulitis 1 Day Post-Op s/p open sigmoidectomy with diverting loop ileostomy, appendectomy, complicated by pertinent comorbidities including diabetes, recent episode of DKA, new onset of A-fib RVR, COPD, obesity polycythemia, and history of tobacco abuse.   - No fever not tachycardic, hypotensive but asymptomatic. No signs of active GI bleeding. Hbg is stable. No blood noted in ileostomy bag. Recorded output from JP drain in the last 24 hrs was 85 cc. Will hold Metoprolol  for now and adjust pain medications. Will consult cardiology and hospitalist. Appreciate their feedback.   - Leukocytosis 13.2, likely reactive from surgery, will continue to monitor  - Tolerating clear liquid diet and experiencing flatulence, advance to full liquid diet   - Remove Foley catheter this morning  - Mildly elevated creatinine levels, started IV fluids  - Consulted wound care nurse to discuss management of ileostomy   - PT/OT eval   - Encouraged to ambulate and  pain management  -- Gilmer Cea PA-C

## 2024-08-31 NOTE — Consult Note (Signed)
 "  Consult note   Yolanda Gray:[id number] DOB: [birth year] DOA: 08/30/2024  DOS: the patient was seen and examined on 08/30/2024  PCP: [name], NP   Patient coming from: Home  I have personally briefly reviewed patient's old medical records in [facility]  Chief Complaint: Hypertension  HPI: Yolanda Gray is a pleasant 69 y.o. female with medical history significant for COPD not on home oxygen, DM, HTN, chronic smoking, paroxysmal A-fib on Eliquis , polycythemia, obesity who was admitted electively on 08/30/2024 for complicated diverticulitis with prior perforation and pericolic abscess.  Patient had a mild AKI with creatinine of 1.26, blood pressure was low in 60s one-time 62/46,.  Hospitalist service was consulted for evaluation for management of medical problems due to hypotension and AKI.  Patient denies any fever, nausea, vomiting, chills, cough, shortness of breath, palpitations, hematuria, dysuria, dizziness, headache.  Patient is postoperative day one of open sigmoidectomy with diverting loop ileostomy and appendectomy for complicated diverticulitis.  Today as mentioned above patient had episode of hypotension and AKI.  Patient was started on IV fluid 75 cc and then upgraded to 100 cc/h.  Patient had a leukocytosis at 13.2, AKI with creatinine of 1.26 baseline is 0.8.  Review of Systems:  ROS  All other systems negative except as noted in the HPI.  Past Medical History:  Diagnosis Date   COPD (chronic obstructive pulmonary disease) (HCC)    Diabetes mellitus without complication (HCC)    Diverticulitis of large intestine with perforation without bleeding    Edentulous    Morbid obesity with BMI of 40.0-44.9, adult (HCC)    Obesity (BMI 30-39.9)    Polycythemia    Thrombocytopenia    Tobacco abuse    Tobacco abuse     Past Surgical History:  Procedure Laterality Date   CATARACT EXTRACTION W/PHACO Left 10/09/2023   Procedure: CATARACT EXTRACTION PHACO AND  INTRAOCULAR LENS PLACEMENT (IOC) LEFT MALYUGIN OMIDRIA  13.44 01:09.5;  Surgeon: Enola Feliciano Hugger, MD;  Location: [facility] SUR[facility]ERY [facility];  Service: Ophthalmology;  Laterality: Left;   CATARACT EXTRACTION W/PHACO Right 10/30/2023   Procedure: PHACOEMULSIFICATION, CATARACT, WITH IOL INSERTION 6.65, 00:53.8;  Surgeon: Enola Feliciano Hugger, MD;  Location: [facility] SURGERY CNTR;  Service: Ophthalmology;  Laterality: Right;   CESAREAN SECTION     COLONOSCOPY N/A 08/25/2024   Procedure: COLONOSCOPY;  Surgeon: Tye Millet, DO;  Location: [facility] ENDOSCOPY;  Service: General;  Laterality: N/A;   CYSTOSCOPY WITH INDOCYANINE GREEN  IMAGING (ICG) N/A 08/30/2024   Procedure: CYSTOSCOPY WITH INDOCYANINE GREEN  IMAGING (ICG);  Surgeon: Francisca Redell BROCKS, MD;  Location: [facility] ORS;  Service: Urology;  Laterality: N/A;   finger laceration     POLYPECTOMY  08/25/2024   Procedure: POLYPECTOMY, INTESTINE;  Surgeon: Tye Millet, DO;  Location: [facility] ENDOSCOPY;  Service: General;;   TONSILLECTOMY       reports that she quit smoking about 4 years ago. Her smoking use included cigarettes. She has never used smokeless tobacco. She reports that she does not currently use alcohol. She reports that she does not use drugs.  Allergies[1]  Family History  Problem Relation Age of Onset   Asthma Mother    Heart attack Mother    Heart attack Father     Prior to Admission medications  Medication Sig Start Date End Date Taking? Authorizing Provider  Glucagon  (BAQSIMI  TWO PACK) 3 MG/DOSE POWD Place 3 mg into the nose as needed for up to 2 doses (Severe low blood sugar). Give 3 mg (  one actuation) into a single nostril. 06/26/24  Yes Dezii, Alexandra, DO  ibuprofen (ADVIL) 600 MG tablet Take 600 mg by mouth every 6 (six) hours as needed (pain).   Yes [provider]  insulin  glargine (LANTUS ) 100 UNIT/ML Solostar Pen Inject 15 Units into the skin daily. May substitute as needed per insurance. Patient taking differently:  Inject 8 Units into the skin in the morning. May substitute as needed per insurance. 06/26/24  Yes Dezii, Alexandra, DO  metoprolol  succinate (TOPROL -XL) 25 MG 24 hr tablet Take 25 mg by mouth at bedtime. 08/26/24 08/26/25 Yes [provider]  amiodarone  (PACERONE ) 200 MG tablet Take 1 tablet (200 mg total) by mouth 2 (two) times daily for 9 days, THEN 1 tablet (200 mg total) daily for 14 days. Patient not taking: Reported on 08/18/2024 06/26/24 07/19/24  Dezii, Alexandra, DO  apixaban  (ELIQUIS ) 5 MG TABS tablet Take 1 tablet (5 mg total) by mouth 2 (two) times daily. Patient not taking: Reported on 08/18/2024 06/26/24 09/24/24  Dezii, Alexandra, DO  Blood Glucose Monitoring Suppl (BLOOD GLUCOSE MONITOR SYSTEM) w/Device KIT 1 each by Does not apply route as directed. Dispense based on patient and insurance preference. Use up to four times daily as directed. (FOR ICD-10 E10.9, E11.9). 06/26/24   Dezii, Alexandra, DO  Glucose Blood (BLOOD GLUCOSE TEST STRIPS) STRP 1 each by Does not apply route as directed. Dispense based on patient and insurance preference. Use up to four times daily as directed. (FOR ICD-10 E10.9, E11.9). 06/26/24   Dezii, Alexandra, DO  insulin  aspart (NOVOLOG ) 100 UNIT/ML FlexPen Inject 0-6 Units into the skin 3 (three) times daily with meals. Check Blood Glucose (BG) and inject per scale: BG <150= 0 unit; BG 150-200= 1 unit; BG 201-250= 2 unit; BG 251-300= 3 unit; BG 301-350= 4 unit; BG 351-400= 5 unit; BG >400= 6 unit and Call Primary Care. 06/26/24   Dezii, Alexandra, DO  Insulin  Pen Needle 32G X 4 MM MISC 1 each by Does not apply route as directed. Dispense based on patient and insurance preference. Use up to four times daily as directed. (FOR ICD-10 E10.9, E11.9). 06/26/24   Dezii, Alexandra, DO  Lancet Device MISC 1 each by Does not apply route as directed. Dispense based on patient and insurance preference. Use up to four times daily as directed. (FOR ICD-10 E10.9, E11.9).  06/26/24   Dezii, Alexandra, DO  Lancets MISC 1 each by Does not apply route as directed. Dispense based on patient and insurance preference. Use up to four times daily as directed. (FOR ICD-10 E10.9, E11.9). 06/26/24   Dezii, Alexandra, DO  pantoprazole  (PROTONIX ) 40 MG tablet Take 1 tablet (40 mg total) by mouth 2 (two) times daily before a meal for 14 days. Patient not taking: Reported on 08/18/2024 06/26/24 07/10/24  Dezii, Alexandra, DO  sodium chloride  flush 0.9 % SOLN injection Use 10 mLs by Intracatheter route daily. 06/25/24   Spencer Glennon HERO, PA-C    Physical Exam: Vitals:   08/31/24 0930 08/31/24 0952 08/31/24 0954 08/31/24 1200  BP: (!) 97/49 (!) 62/46 (!) 90/51 (!) 102/49  Pulse: 64 88 87 83  Resp: (!) 22   20  Temp: (!) 97.4 F (36.3 C)   (!) 97.5 F (36.4 C)  TempSrc: Oral   Oral  SpO2: 94% 93%  97%    Physical Exam   Constitutional: Alert, awake, calm, comfortable HEENT: Neck supple Respiratory: Clear to auscultation B/L, no wheezing, no rales.  Cardiovascular: Regular rate  and rhythm, no murmurs / rubs / gallops. No extremity edema. 2+ pedal pulses. No carotid bruits.  Abdomen: Soft, no tenderness, Bowel sounds positive.  Ileostomy is present Musculoskeletal: no clubbing / cyanosis. Good ROM, no contractures. Normal muscle tone.  Skin: no rashes, lesions, ulcers. Neurologic: CN 2-12 grossly intact. Sensation intact, No focal deficit identified Psychiatric: Alert and oriented x 3. Normal mood.    Labs on Admission: I have personally reviewed following labs and imaging studies  CBC: Recent Labs  Lab 08/27/24 0956 08/31/24 0410  WBC 9.1 13.2*  HGB 10.7* 9.3*  HCT 34.5* 31.0*  MCV 86.0 88.3  PLT 305 332   Basic Metabolic Panel: Recent Labs  Lab 08/30/24 1622 08/31/24 0410  NA  --  136  K  --  4.4  CL  --  101  CO2  --  24  GLUCOSE  --  124*  BUN  --  16  CREATININE 0.92 1.26*  CALCIUM  --  8.4*   GFR: Estimated Creatinine Clearance: 46.5  mL/min (A) (by C-G formula based on SCr of 1.26 mg/dL (H)). Liver Function Tests: No results for input(s): AST, ALT, ALKPHOS, BILITOT, PROT, ALBUMIN in the last 168 hours. No results for input(s): LIPASE, AMYLASE in the last 168 hours. No results for input(s): AMMONIA in the last 168 hours. Coagulation Profile: No results for input(s): INR, PROTIME in the last 168 hours. Cardiac Enzymes: No results for input(s): CKTOTAL, CKMB, CKMBINDEX, TROPONINI, TROPONINIHS in the last 168 hours. BNP (last 3 results) No results for input(s): BNP in the last 8760 hours. HbA1C: No results for input(s): HGBA1C in the last 72 hours. CBG: Recent Labs  Lab 08/30/24 1332 08/30/24 1608 08/30/24 2204 08/31/24 0721 08/31/24 1100  GLUCAP 204* 190* 176* 169* 175*   Lipid Profile: No results for input(s): CHOL, HDL, LDLCALC, TRIG, CHOLHDL, LDLDIRECT in the last 72 hours. Thyroid Function Tests: No results for input(s): TSH, T4TOTAL, FREET4, T3FREE, THYROIDAB in the last 72 hours. Anemia Panel: No results for input(s): VITAMINB12, FOLATE, FERRITIN, TIBC, IRON, RETICCTPCT in the last 72 hours. Urine analysis:    Component Value Date/Time   COLORURINE AMBER (A) 06/21/2024 1702   COLORURINE AMBER (A) 06/21/2024 1702   APPEARANCEUR CLOUDY (A) 06/21/2024 1702   APPEARANCEUR CLOUDY (A) 06/21/2024 1702   LABSPEC >1.046 (H) 06/21/2024 1702   LABSPEC >1.046 (H) 06/21/2024 1702   PHURINE 5.0 06/21/2024 1702   PHURINE 5.0 06/21/2024 1702   GLUCOSEU >=500 (A) 06/21/2024 1702   GLUCOSEU >=500 (A) 06/21/2024 1702   HGBUR NEGATIVE 06/21/2024 1702   HGBUR NEGATIVE 06/21/2024 1702   BILIRUBINUR NEGATIVE 06/21/2024 1702   BILIRUBINUR NEGATIVE 06/21/2024 1702   KETONESUR NEGATIVE 06/21/2024 1702   KETONESUR NEGATIVE 06/21/2024 1702   PROTEINUR >=300 (A) 06/21/2024 1702   PROTEINUR >=300 (A) 06/21/2024 1702   NITRITE NEGATIVE 06/21/2024 1702    NITRITE NEGATIVE 06/21/2024 1702   LEUKOCYTESUR MODERATE (A) 06/21/2024 1702   LEUKOCYTESUR SMALL (A) 06/21/2024 1702    Radiological Exams on Admission: I have personally reviewed images No results found.  EKG N/A    Assessment/Plan Principal Problem:   Diverticulitis of intestine with abscess Active Problems:   COPD (chronic obstructive pulmonary disease) (HCC)   Polycythemia   Tobacco abuse   AKI (acute kidney injury)   T2DM (type 2 diabetes mellitus) (HCC)    Assessment and Plan: 69 year old female electively admitted on 08/30/2024 for complicated diverticulitis surgery.  She is status post open sigmoidectomy with diverting loop ileostomy  and appendectomy for complicated diverticulitis 08/30/24.  1.  Complicated diverticulitis s/p open sigmoidectomy with diverting loop ileostomy - POD #1 - Patient has ostomy - Plan for primary team.  2.  Paroxysmal A-fib/hypertension - Patient received 500 cc of normal saline bolus - Currently patient is on normal saline 100 cc maintenance - Eliquis  was held, can resume once no concern for bleeding. - Metoprolol  was held by cardiology and advised to resume at discharge. - Cardiology started amiodarone  200 mg twice daily for 4 more days and followed by 200 mg daily. - Continue to monitor blood pressure - Continue to monitor heart rate and telemetry  3.  AKI - Creatinine slightly worsened than baseline - Continue normal saline at 100 cc/h until tomorrow - Check BMP in the morning.  4.  History of diabetes - Continue monitor blood sugar and placed on sliding scale.  5.  History of COPD/tobacco abuse/polycythemia - Nebulization as needed - Incentive spirometry - Patient is not requiring oxygen   Thank you for the opportunity to get involved in the care of this patient.  Hospitalist team will continue to follow-up.     Kathey Simer, MD [facility] 08/31/2024, 1:54 PM        [1]  Allergies Allergen Reactions    Pork Allergy Hives and Swelling   Porcine (Pork) Protein-Containing Drug Products Hives   "

## 2024-08-31 NOTE — Consult Note (Signed)
 " [facility] CLINIC CARDIOLOGY CONSULT NOTE       Patient ID: Yolanda Gray MRN: [id number] DOB/AGE: 68-10-1955 68 y.o.  Admit date: 08/30/2024 Referring Physician Gilmer Hong, [place] Primary Physician Gauger, Lauraine Collar, NP  Primary Cardiologist Dr. Dewane Reason for Consultation Hypotension  HPI: Yolanda Gray is a 69 y.o. female  with a past medical history of paroxysmal atrial fibrillation, COPD, tobacco use, polycythemia, obesity who presented for outpatient surgery on 08/30/2024 for complicated diverticulitis with prior perforation and pericolic abscess. Hypotensive this AM. Cardiology was consulted for further evaluation.   Patient is post-op day one of open sigmoidectomy with diverting loop ileostomy and appendectomy for complicated diverticulitis. This AM developed hypotension. Labs today notable for creatinine 1.26, potassium 4.4, hemoglobin 9.3, WBC 13.2. S/p 500 cc bolus and now on IVF continuous infusion at 100 mL/hour. No repeat BP check available after IVF bolus.  At the time of my evaluation the patient is sitting upright in bedside chair with family at bedside. Reports feeling ok overall, just some abdominal discomfort. Denies any SOB, CP, palpitations, dizziness or lightheadedness.   Review of systems complete and found to be negative unless listed above    Past Medical History:  Diagnosis Date   COPD (chronic obstructive pulmonary disease) (HCC)    Diabetes mellitus without complication (HCC)    Diverticulitis of large intestine with perforation without bleeding    Edentulous    Morbid obesity with BMI of 40.0-44.9, adult (HCC)    Obesity (BMI 30-39.9)    Polycythemia    Thrombocytopenia    Tobacco abuse    Tobacco abuse     Past Surgical History:  Procedure Laterality Date   CATARACT EXTRACTION W/PHACO Left 10/09/2023   Procedure: CATARACT EXTRACTION PHACO AND INTRAOCULAR LENS PLACEMENT (IOC) LEFT MALYUGIN OMIDRIA  13.44 01:09.5;  Surgeon: Enola Feliciano Hugger, MD;  Location: [facility];  Service: Ophthalmology;  Laterality: Left;   CATARACT EXTRACTION W/PHACO Right 10/30/2023   Procedure: PHACOEMULSIFICATION, CATARACT, WITH IOL INSERTION 6.65, 00:53.8;  Surgeon: Enola Feliciano Hugger, MD;  Location: [facility];  Service: Ophthalmology;  Laterality: Right;   CESAREAN SECTION     COLONOSCOPY N/A 08/25/2024   Procedure: COLONOSCOPY;  Surgeon: Tye Millet, DO;  Location: [facility];  Service: General;  Laterality: N/A;   CYSTOSCOPY WITH INDOCYANINE GREEN  IMAGING (ICG) N/A 08/30/2024   Procedure: CYSTOSCOPY WITH INDOCYANINE GREEN  IMAGING (ICG);  Surgeon: Francisca Redell BROCKS, MD;  Location: [facility] ORS;  Service: Urology;  Laterality: N/A;   finger laceration     POLYPECTOMY  08/25/2024   Procedure: POLYPECTOMY, INTESTINE;  Surgeon: Tye Millet, DO;  Location: [facility] ENDOS[facility];  Service: General;;   TONSILLECTOMY      Medications Prior to Admission  Medication Sig Dispense Refill Last Dose/Taking   Glucagon  (BAQSIMI  TWO PACK) 3 MG/DOSE POWD Place 3 mg into the nose as needed for up to 2 doses (Severe low blood sugar). Give 3 mg (one actuation) into a single nostril. 2 each 0 Unknown   ibuprofen (ADVIL) 600 MG tablet Take 600 mg by mouth every 6 (six) hours as needed (pain).   Past Week   insulin  glargine (LANTUS ) 100 UNIT/ML Solostar Pen Inject 15 Units into the skin daily. May substitute as needed per insurance. (Patient taking differently: Inject 8 Units into the skin in the morning. May substitute as needed per insurance.) 15 mL 0 08/29/2024   metoprolol  succinate (TOPROL -XL) 25 MG 24 hr tablet Take 25 mg by mouth at bedtime.  Past Week   amiodarone  (PACERONE ) 200 MG tablet Take 1 tablet (200 mg total) by mouth 2 (two) times daily for 9 days, THEN 1 tablet (200 mg total) daily for 14 days. (Patient not taking: Reported on 08/18/2024) 32 tablet 0 Not Taking   apixaban  (ELIQUIS ) 5 MG TABS tablet Take 1 tablet (5 mg total) by mouth 2  (two) times daily. (Patient not taking: Reported on 08/18/2024) 60 tablet 2 Not Taking   Blood Glucose Monitoring Suppl (BLOOD GLUCOSE MONITOR SYSTEM) w/Device KIT 1 each by Does not apply route as directed. Dispense based on patient and insurance preference. Use up to four times daily as directed. (FOR ICD-10 E10.9, E11.9). 1 kit 0    Glucose Blood (BLOOD GLUCOSE TEST STRIPS) STRP 1 each by Does not apply route as directed. Dispense based on patient and insurance preference. Use up to four times daily as directed. (FOR ICD-10 E10.9, E11.9). 100 each 0    insulin  aspart (NOVOLOG ) 100 UNIT/ML FlexPen Inject 0-6 Units into the skin 3 (three) times daily with meals. Check Blood Glucose (BG) and inject per scale: BG <150= 0 unit; BG 150-200= 1 unit; BG 201-250= 2 unit; BG 251-300= 3 unit; BG 301-350= 4 unit; BG 351-400= 5 unit; BG >400= 6 unit and Call Primary Care. 15 mL 0 Not Taking   Insulin  Pen Needle 32G X 4 MM MISC 1 each by Does not apply route as directed. Dispense based on patient and insurance preference. Use up to four times daily as directed. (FOR ICD-10 E10.9, E11.9). 100 each 0    Lancet Device MISC 1 each by Does not apply route as directed. Dispense based on patient and insurance preference. Use up to four times daily as directed. (FOR ICD-10 E10.9, E11.9). 1 each 0    Lancets MISC 1 each by Does not apply route as directed. Dispense based on patient and insurance preference. Use up to four times daily as directed. (FOR ICD-10 E10.9, E11.9). 100 each 0    pantoprazole  (PROTONIX ) 40 MG tablet Take 1 tablet (40 mg total) by mouth 2 (two) times daily before a meal for 14 days. (Patient not taking: Reported on 08/18/2024) 28 tablet 0 Not Taking   sodium chloride  flush 0.9 % SOLN injection Use 10 mLs by Intracatheter route daily. 300 mL 0    Social History   Socioeconomic History   Marital status: Married    Spouse name: Golliday,GARLAND   Number of children: Not on file   Years of education: Not on  file   Highest education level: Not on file  Occupational History   Not on file  Tobacco Use   Smoking status: Former    Current packs/day: 0.00    Types: Cigarettes    Quit date: 2022    Years since quitting: 4.0   Smokeless tobacco: Never  Vaping Use   Vaping status: Never Used  Substance and Sexual Activity   Alcohol use: Not Currently   Drug use: Never   Sexual activity: Not on file  Other Topics Concern   Not on file  Social History Narrative   Daughter and 3 grand kids in home   Social Drivers of Health   Tobacco Use: Medium Risk (08/30/2024)   Patient History    Smoking Tobacco Use: Former    Smokeless Tobacco Use: Never    Passive Exposure: Not on file  Financial Resource Strain: High Risk (08/16/2024)   Received from [facility]   Overall Financial Resource  Strain (CARDIA)    Difficulty of Paying Living Expenses: Hard  Food Insecurity: No Food Insecurity (08/30/2024)   Epic    Worried About Programme Researcher, Broadcasting/film/video in the Last Year: Never true    Ran Out of Food in the Last Year: Never true  Recent Concern: Food Insecurity - Food Insecurity Present (08/16/2024)   Received from [facility]   Epic    Within the past 12 months, you worried that your food would run out before you got the money to buy more.: Sometimes true    Within the past 12 months, the food you bought just didn't last and you didn't have money to get more.: Never true  Transportation Needs: No Transportation Needs (08/30/2024)   Epic    Lack of Transportation (Medical): No    Lack of Transportation (Non-Medical): No  Recent Concern: Transportation Needs - Unmet Transportation Needs (07/13/2024)   Received from [facility] - Transportation    In the past 12 months, has lack of transportation kept you from medical appointments or from getting medications?: Yes    Lack of Transportation (Non-Medical): Yes  Physical Activity: Insufficiently  Active (08/16/2024)   Received from [facility]   Exercise Vital Sign    On average, how many days per week do you engage in moderate to strenuous exercise (like a brisk walk)?: 7 days    On average, how many minutes do you engage in exercise at this level?: 20 min  Stress: No Stress Concern Present (08/16/2024)   Received from [facility] - Occupational Stress Questionnaire    Feeling of Stress : Not at all  Social Connections: Moderately Integrated (08/30/2024)   Social Connection and Isolation Panel    Frequency of Communication with Friends and Family: More than three times a week    Frequency of Social Gatherings with Friends and Family: More than three times a week    Attends Religious Services: More than 4 times per year    Active Member of [employer] or Organizations: No    Attends Banker Meetings: Never    Marital Status: Married  Catering Manager Violence: Not At Risk (08/30/2024)   Epic    Fear of Current or Ex-Partner: No    Emotionally Abused: No    Physically Abused: No    Sexually Abused: No  Depression (PHQ2-9): Low Risk (08/10/2024)   Depression (PHQ2-9)    PHQ-2 Score: 0  Alcohol Screen: Not on file  Housing: Low Risk (08/30/2024)   Epic    Unable to Pay for Housing in the Last Year: No    Number of Times Moved in the Last Year: 0    Homeless in the Last Year: No  Recent Concern: Housing - High Risk (08/16/2024)   Received from [facility]   Epic    In the last 12 months, was there a time when you were not able to pay the mortgage or rent on time?: Yes    In the past 12 months, how many times have you moved where you were living?: 0    At any time in the past 12 months, were you homeless or living in a shelter (including now)?: No  Utilities: Not At Risk (08/30/2024)   Epic    Threatened with loss of utilities: No  Recent Concern: Utilities - At Risk (08/16/2024)    Received from  [facility]   Epic    In the past 12 months has the electric, gas, oil, or water  company threatened to shut off services in your home?: Yes  Health Literacy: Adequate Health Literacy (08/16/2024)   Received from [facility]   [phone] Health Literacy    How often do you need to have someone help you when you read instructions, pamphlets, or other written material from your doctor or pharmacy?: Never    Family History  Problem Relation Age of Onset   Asthma Mother    Heart attack Mother    Heart attack Father      Vitals:   08/31/24 0718 08/31/24 0930 08/31/24 0952 08/31/24 0954  BP: (!) 92/54 (!) 97/49 (!) 62/46 (!) 90/51  Pulse: 85 64 88 87  Resp: 15 (!) 22    Temp: (!) 97.5 F (36.4 C) (!) 97.4 F (36.3 C)    TempSrc: Oral Oral    SpO2: 97% 94% 93%     PHYSICAL EXAM General: Well appearing female, well nourished, in no acute distress. HEENT: Normocephalic and atraumatic. Neck: No JVD.  Lungs: Normal respiratory effort on room air. Clear bilaterally to auscultation. No wheezes, crackles, rhonchi.  Heart: HRRR. Normal S1 and S2 without gallops or murmurs.  Abdomen: Non-distended appearing.  Msk: Normal strength and tone for age. Extremities: Warm and well perfused. No clubbing, cyanosis. No edema.  Neuro: Alert and oriented X 3. Psych: Answers questions appropriately.   Labs: Basic Metabolic Panel: Recent Labs    08/30/24 1622 08/31/24 0410  NA  --  136  K  --  4.4  CL  --  101  CO2  --  24  GLUCOSE  --  124*  BUN  --  16  CREATININE 0.92 1.26*  CALCIUM  --  8.4*   Liver Function Tests: No results for input(s): AST, ALT, ALKPHOS, BILITOT, PROT, ALBUMIN in the last 72 hours. No results for input(s): LIPASE, AMYLASE in the last 72 hours. CBC: Recent Labs    08/31/24 0410  WBC 13.2*  HGB 9.3*  HCT 31.0*  MCV 88.3  PLT 332   Cardiac Enzymes: No results for input(s): CKTOTAL, CKMB,  CKMBINDEX, TROPONINIHS in the last 72 hours. BNP: No results for input(s): BNP in the last 72 hours. D-Dimer: No results for input(s): DDIMER in the last 72 hours. Hemoglobin A1C: No results for input(s): HGBA1C in the last 72 hours. Fasting Lipid Panel: No results for input(s): CHOL, HDL, LDLCALC, TRIG, CHOLHDL, LDLDIRECT in the last 72 hours. Thyroid Function Tests: No results for input(s): TSH, T4TOTAL, T3FREE, THYROIDAB in the last 72 hours.  Invalid input(s): FREET3 Anemia Panel: No results for input(s): VITAMINB12, FOLATE, FERRITIN, TIBC, IRON, RETICCTPCT in the last 72 hours.   Radiology: No results found.  ECHO 06/2024: 1. Left ventricular ejection fraction, by estimation, is 60 to 65%. The left ventricle has normal function. The left ventricle has no regional wall motion abnormalities. Left ventricular diastolic parameters are indeterminate.   2. Right ventricular systolic function is normal. The right ventricular  size is normal. There is normal pulmonary artery systolic pressure. The  estimated right ventricular systolic pressure is 30.0 mmHg.   3. The mitral valve is normal in structure. No evidence of mitral valve  regurgitation. No evidence of mitral stenosis.   4. The aortic valve is normal in structure. Aortic valve regurgitation is  not visualized. No aortic stenosis is present.   5. The inferior vena  cava is normal in size with greater than 50%  respiratory variability, suggesting right atrial pressure of 3 mmHg.   TELEMETRY (personally reviewed): not on tele  EKG 08/27/24 (personally reviewed): NSR rate 89 bpm  Data reviewed by me 08/31/2024: last 24h vitals tele labs imaging I/O ED provider note, admission H&P, general surgery notes  Principal Problem:   Diverticulitis of intestine with abscess    ASSESSMENT AND PLAN:  Yolanda Gray is a 69 y.o. female  with a past medical history of paroxysmal atrial fibrillation,  COPD, tobacco use, polycythemia, obesity who presented for outpatient surgery on 08/30/2024 for complicated diverticulitis with prior perforation and pericolic abscess. Hypotensive this AM. Cardiology was consulted for further evaluation.   # Hypotension # POD #1 open sigmoidectomy with loop ileostomy/appendectomy # Paroxysmal atrial fibrillation Patient had surgery yesterday and was recovering well until developing hypotension on morning of 08/31/24. Given 500 cc bolus and started on continuous IVF. She is asymptomatic.  -Ok with IVF from cardiology perspective as echo from 06/2024 with preserved EF and currently without any HF symptoms.  -Hold metoprolol . Consider resuming prior to DC pending BP.  -Continue amiodarone  200 mg twice daily for 4 more days followed by 200 mg daily.  -Continue holding Eliquis  until OK to resume per general surgery.    This patient's plan of care was discussed and created with Dr. Florencio and he is in agreement.  Signed: Danita Bloch, PA-C  08/31/2024, 11:11 AM [facility] Cardiology      "

## 2024-08-31 NOTE — Evaluation (Signed)
 Occupational Therapy Evaluation Patient Details Name: Yolanda Gray MRN: [id number] DOB: 10/08/1955 Today's Date: 08/31/2024   History of Present Illness   69 y.o. female with complicated diverticulitis s/p open sigmoidectomy with diverting loop ileostomy, appendectomy on 08/30/24. PMH: diabetes, recent episode of DKA, new onset of A-fib RVR, COPD, obesity polycythemia, and history of tobacco abuse.     Clinical Impressions Pt was seen for OT evaluation this date. Prior to hospital admission, pt was sitting EOB. Pt lives with spouse. Pt currently requires CGA and RW for sit to stand from elevated bed and ambulating from bed to chair. Anticipate Min a for LB dressing. Vitals monitored during session, no drop of BP noted and patient denied dizziness. RN in end of session. Pt would benefit from skilled OT to address noted impairments and functional limitations (see below for any additional details). Upon hospital discharge, recommend OT follow up.       If plan is discharge home, recommend the following:   A little help with walking and/or transfers;A little help with bathing/dressing/bathroom;Assistance with cooking/housework;Help with stairs or ramp for entrance;Assist for transportation     Functional Status Assessment   Patient has had a recent decline in their functional status and demonstrates the ability to make significant improvements in function in a reasonable and predictable amount of time.     Equipment Recommendations   BSC/3in1     Recommendations for Other Services         Precautions/Restrictions   Precautions Precautions: Fall Recall of Precautions/Restrictions: Intact Restrictions Weight Bearing Restrictions Per Provider Order: No     Mobility Bed Mobility Overal bed mobility:  (Not tested)                  Transfers Overall transfer level: Needs assistance Equipment used: Rolling walker (2 wheels)                      Balance  Overall balance assessment: Needs assistance Sitting-balance support: Feet supported Sitting balance-Leahy Scale: Good     Standing balance support: Bilateral upper extremity supported, During functional activity, Reliant on assistive device for balance                               ADL either performed or assessed with clinical judgement   ADL Overall ADL's : Needs assistance/impaired                                       General ADL Comments: Anticipate Min a for LB dressing.     Vision         Perception         Praxis         Pertinent Vitals/Pain Pain Assessment Pain Assessment: No/denies pain     Extremity/Trunk Assessment Upper Extremity Assessment Upper Extremity Assessment: Generalized weakness   Lower Extremity Assessment Lower Extremity Assessment: Generalized weakness       Communication Communication Communication: No apparent difficulties   Cognition Arousal: Alert Behavior During Therapy: WFL for tasks assessed/performed Cognition: No apparent impairments                               Following commands: Intact       Cueing  General Comments   Cueing Techniques:  Verbal cues  C/D/I   Exercises     Shoulder Instructions      Home Living Family/patient expects to be discharged to:: Private residence Living Arrangements: Spouse/significant other Available Help at Discharge: Family Type of Home: House Home Access: Stairs to enter Secretary/administrator of Steps: 4 Entrance Stairs-Rails: Right;Left Home Layout: One level     Bathroom Shower/Tub: Producer, Television/film/video: Standard     Home Equipment: Firefighter;Wheelchair - manual          Prior Functioning/Environment Prior Level of Function : Independent/Modified Independent                    OT Problem List: Decreased strength;Decreased activity tolerance;Pain   OT Treatment/Interventions:  Self-care/ADL training;Therapeutic exercise;Energy conservation;DME and/or AE instruction;Therapeutic activities;Patient/family education      OT Goals(Current goals can be found in the care plan section)   Acute Rehab OT Goals Patient Stated Goal: Go home OT Goal Formulation: With patient Time For Goal Achievement: 09/14/24 Potential to Achieve Goals: Good ADL Goals Pt Will Perform Grooming: Independently;standing;sitting Pt Will Perform Lower Body Bathing: Independently;sit to/from stand;sitting/lateral leans Pt Will Transfer to Toilet: with modified independence;ambulating;regular height toilet   OT Frequency:  Min 2X/week    Co-evaluation              AM-PAC OT 6 Clicks Daily Activity     Outcome Measure Help from another person eating meals?: None Help from another person taking care of personal grooming?: A Little Help from another person toileting, which includes using toliet, bedpan, or urinal?: A Little Help from another person bathing (including washing, rinsing, drying)?: A Lot Help from another person to put on and taking off regular upper body clothing?: A Little Help from another person to put on and taking off regular lower body clothing?: A Little 6 Click Score: 18   End of Session Equipment Utilized During Treatment: Rolling walker (2 wheels)  Activity Tolerance: Patient tolerated treatment well Patient left: in chair;with call bell/phone within reach;with family/visitor present;with nursing/sitter in room;with chair alarm set  OT Visit Diagnosis: Muscle weakness (generalized) (M62.81);Other abnormalities of gait and mobility (R26.89)                Time: 8981-8961 OT Time Calculation (min): 20 min Charges:  OT General Charges $OT Visit: 1 Visit OT Evaluation $OT Eval Moderate Complexity: 1 Mod  Olisa Quesnel OTS Bobby Ragan 08/31/2024, 11:24 AM

## 2024-08-31 NOTE — Evaluation (Signed)
 Physical Therapy Evaluation Patient Details Name: Yolanda Gray MRN: [id number] DOB: 1956/06/23 Today's Date: 08/31/2024  History of Present Illness  69 y.o. female with complicated diverticulitis s/p open sigmoidectomy with diverting loop ileostomy, appendectomy on 08/30/24. PMH: diabetes, recent episode of DKA, new onset of A-fib RVR, COPD, obesity polycythemia, and history of tobacco abuse.  Clinical Impression  Patient noted to be in seated position at PT arrival in room, for an initial PT evaluation due to a decline in functional status, with baseline mobility reported as independent, and currently requiring min/CGA for 160' with RW. The patient is A&O x 4, presenting with good willingness to work with PT. The patient resides in a house and lives with spouse with family/friend support. There are 4 STE inside the residence. Gait was assessed with RW. Gait mechanic observations noted mild unsteadiness that improve by end of session. The overall clinical impression is that the patient presents with mild mobility limitations. Recommended skilled PT will address safety, mobility, and discharge planning.        If plan is discharge home, recommend the following: A little help with walking and/or transfers;A little help with bathing/dressing/bathroom;Help with stairs or ramp for entrance;Assist for transportation   Can travel by private vehicle        Equipment Recommendations Rolling walker (2 wheels)  Recommendations for Other Services       Functional Status Assessment Patient has had a recent decline in their functional status and demonstrates the ability to make significant improvements in function in a reasonable and predictable amount of time.     Precautions / Restrictions Precautions Precautions: Fall Recall of Precautions/Restrictions: Intact Restrictions Weight Bearing Restrictions Per Provider Order: No      Mobility  Bed Mobility                     Transfers Overall transfer level: Needs assistance Equipment used: Rolling walker (2 wheels) Transfers: Sit to/from Stand Sit to Stand: Contact guard assist, From elevated surface           General transfer comment: cues for hand placement    Ambulation/Gait Ambulation/Gait assistance: Contact guard assist, Min assist Gait Distance (Feet): 160 Feet Assistive device: Rolling walker (2 wheels) Gait Pattern/deviations: Trunk flexed, Antalgic Gait velocity: decreased     General Gait Details: mild unsteadiness that improved throughout session  Stairs            Wheelchair Mobility     Tilt Bed    Modified Rankin (Stroke Patients Only)       Balance Overall balance assessment: Needs assistance Sitting-balance support: Feet supported Sitting balance-Leahy Scale: Good     Standing balance support: Bilateral upper extremity supported, During functional activity, Reliant on assistive device for balance Standing balance-Leahy Scale: Fair                               Pertinent Vitals/Pain Pain Assessment Pain Assessment: Faces Faces Pain Scale: Hurts even more Pain Location: abdomen Pain Descriptors / Indicators: Aching, Burning, Throbbing Pain Intervention(s): Monitored during session, Limited activity within patient's tolerance, Premedicated before session    Home Living Family/patient expects to be discharged to:: Private residence Living Arrangements: Spouse/significant other Available Help at Discharge: Family Type of Home: House Home Access: Stairs to enter Entrance Stairs-Rails: Doctor, General Practice of Steps: 4   Home Layout: One level Home Equipment: Firefighter;Wheelchair - manual  Prior Function Prior Level of Function : Independent/Modified Independent                     Extremity/Trunk Assessment   Upper Extremity Assessment Upper Extremity Assessment: Generalized weakness    Lower  Extremity Assessment Lower Extremity Assessment: Generalized weakness    Cervical / Trunk Assessment Cervical / Trunk Assessment: Normal  Communication   Communication Communication: No apparent difficulties    Cognition Arousal: Alert Behavior During Therapy: WFL for tasks assessed/performed   PT - Cognitive impairments: No apparent impairments                         Following commands: Intact       Cueing Cueing Techniques: Verbal cues     General Comments      Exercises     Assessment/Plan    PT Assessment Patient needs continued PT services  PT Problem List Decreased strength;Decreased balance;Decreased mobility;Decreased activity tolerance       PT Treatment Interventions Gait training;Stair training;Functional mobility training;Therapeutic exercise;Therapeutic activities;Balance training;Patient/family education;Neuromuscular re-education    PT Goals (Current goals can be found in the Care Plan section)  Acute Rehab PT Goals Patient Stated Goal: Pt wants to go home PT Goal Formulation: With patient Time For Goal Achievement: 09/21/24 Potential to Achieve Goals: Good    Frequency Min 2X/week     Co-evaluation               AM-PAC PT 6 Clicks Mobility  Outcome Measure Help needed turning from your back to your side while in a flat bed without using bedrails?: A Little Help needed moving from lying on your back to sitting on the side of a flat bed without using bedrails?: A Little Help needed moving to and from a bed to a chair (including a wheelchair)?: A Little Help needed standing up from a chair using your arms (e.g., wheelchair or bedside chair)?: A Little Help needed to walk in hospital room?: A Little Help needed climbing 3-5 steps with a railing? : A Little 6 Click Score: 18    End of Session   Activity Tolerance: Patient tolerated treatment well Patient left: in bed;with call bell/phone within reach;with bed alarm set;with  family/visitor present Nurse Communication: Mobility status PT Visit Diagnosis: Other abnormalities of gait and mobility (R26.89);Other symptoms and signs involving the nervous system (R29.898)    Time: 8751-8697 PT Time Calculation (min) (ACUTE ONLY): 14 min   Charges:   PT Evaluation $PT Eval Low Complexity: 1 Low   PT General Charges $$ ACUTE PT VISIT: 1 Visit         Sherlean Lesches DPT, PT    Maeghan Canny A Lenni Reckner 08/31/2024, 2:23 PM

## 2024-08-31 NOTE — Progress Notes (Signed)
 Mobility Specialist Progress Note:    08/31/24 0937  Mobility  Activity Dangled on edge of bed  Level of Assistance Contact guard assist, steadying assist  Mobility visit 1 Mobility  Mobility Specialist Start Time (ACUTE ONLY) 0931  Mobility Specialist Stop Time (ACUTE ONLY) 0936  Mobility Specialist Time Calculation (min) (ACUTE ONLY) 5 min   MS responding to bed alarm, pt wanting to sit EOB. Family at bedside for assistance. Left pt sitting EOB with alarm on, all needs met.  Sherrilee Ditty Mobility Specialist Please contact via Special Educational Needs Teacher or  Rehab office at [phone]

## 2024-08-31 NOTE — Consult Note (Signed)
 WOC Nurse ostomy consult note Stoma type/location: loop ileostomy 08/30/24 Stomal assessment/size: 1 3/4 oval shaped, flush with the skin, functional os at 3 o'clock Support rod in place   Peristomal assessment: dip in abdominal topography from 2 to 5 o'clock, near the incision and at umbilicus  Treatment options for stomal/peristomal skin: 2 barrier ring; may need soft convexity with next pouch change  Output liquid, green, +flatus Ostomy pouching: 2pc. 2 3/4 flat with 2 barrier ring Education provided:  Explained role of ostomy nurse and creation of stoma  Explained stoma characteristics (budded, flush, color, texture, care) Demonstrated pouch change (cutting new skin barrier, measuring stoma, cleaning peristomal skin and stoma, use of barrier ring) Education on emptying when 1/3 to 1/2 full and how to empty Demonstrated use of wick to clean spout  Discussed diet, gas, diarrhea, dehydration  Discussed food blockage     Enrolled patient in [employer] DC program: Yes  Will plan to see patient tomorrow for additional teaching, may need convexity.  Ahmeer Tuman [facility], CNS, CWON-AP [phone]

## 2024-08-31 NOTE — Plan of Care (Signed)
" °  Problem: Education: Goal: Knowledge of General Education information will improve Description: Including pain rating scale, medication(s)/side effects and non-pharmacologic comfort measures Outcome: Progressing   Problem: Health Behavior/Discharge Planning: Goal: Ability to manage health-related needs will improve Outcome: Progressing   Problem: Clinical Measurements: Goal: Ability to maintain clinical measurements within normal limits will improve Outcome: Progressing Goal: Will remain free from infection Outcome: Progressing Goal: Diagnostic test results will improve Outcome: Progressing Goal: Respiratory complications will improve Outcome: Progressing Goal: Cardiovascular complication will be avoided Outcome: Progressing   Problem: Activity: Goal: Risk for activity intolerance will decrease Outcome: Progressing   Problem: Nutrition: Goal: Adequate nutrition will be maintained Outcome: Progressing   Problem: Coping: Goal: Level of anxiety will decrease Outcome: Progressing   Problem: Elimination: Goal: Will not experience complications related to bowel motility Outcome: Progressing Goal: Will not experience complications related to urinary retention Outcome: Progressing   Problem: Pain Managment: Goal: General experience of comfort will improve and/or be controlled Outcome: Progressing   Problem: Safety: Goal: Ability to remain free from injury will improve Outcome: Progressing   Problem: Skin Integrity: Goal: Risk for impaired skin integrity will decrease Outcome: Progressing   Problem: Education: Goal: Knowledge of the prescribed therapeutic regimen will improve Outcome: Progressing   Problem: Bowel/Gastric: Goal: Gastrointestinal status for postoperative course will improve Outcome: Progressing   Problem: Cardiac: Goal: Ability to maintain an adequate cardiac output Outcome: Progressing Goal: Will show no evidence of cardiac arrhythmias Outcome:  Progressing   Problem: Nutritional: Goal: Will attain and maintain optimal nutritional status Outcome: Progressing   Problem: Neurological: Goal: Will regain or maintain usual level of consciousness Outcome: Progressing   Problem: Clinical Measurements: Goal: Ability to maintain clinical measurements within normal limits Outcome: Progressing Goal: Postoperative complications will be avoided or minimized Outcome: Progressing   Problem: Respiratory: Goal: Will regain and/or maintain adequate ventilation Outcome: Progressing Goal: Respiratory status will improve Outcome: Progressing   Problem: Skin Integrity: Goal: Demonstrates signs of wound healing without infection Outcome: Progressing   Problem: Urinary Elimination: Goal: Will remain free from infection Outcome: Progressing Goal: Ability to achieve and maintain adequate urine output Outcome: Progressing   Problem: Education: Goal: Ability to describe self-care measures that may prevent or decrease complications (Diabetes Survival Skills Education) will improve Outcome: Progressing Goal: Individualized Educational Video(s) Outcome: Progressing   Problem: Coping: Goal: Ability to adjust to condition or change in health will improve Outcome: Progressing   Problem: Fluid Volume: Goal: Ability to maintain a balanced intake and output will improve Outcome: Progressing   Problem: Health Behavior/Discharge Planning: Goal: Ability to identify and utilize available resources and services will improve Outcome: Progressing Goal: Ability to manage health-related needs will improve Outcome: Progressing   Problem: Metabolic: Goal: Ability to maintain appropriate glucose levels will improve Outcome: Progressing   Problem: Nutritional: Goal: Maintenance of adequate nutrition will improve Outcome: Progressing Goal: Progress toward achieving an optimal weight will improve Outcome: Progressing   Problem: Skin  Integrity: Goal: Risk for impaired skin integrity will decrease Outcome: Progressing   Problem: Tissue Perfusion: Goal: Adequacy of tissue perfusion will improve Outcome: Progressing   "

## 2024-09-01 DIAGNOSIS — K5780 Diverticulitis of intestine, part unspecified, with perforation and abscess without bleeding: Secondary | ICD-10-CM | POA: Diagnosis not present

## 2024-09-01 LAB — CBC
HCT: 26 % — ABNORMAL LOW (ref 36.0–46.0)
Hemoglobin: 8 g/dL — ABNORMAL LOW (ref 12.0–15.0)
MCH: 27.1 pg (ref 26.0–34.0)
MCHC: 30.8 g/dL (ref 30.0–36.0)
MCV: 88.1 fL (ref 80.0–100.0)
Platelets: 254 K/uL (ref 150–400)
RBC: 2.95 MIL/uL — ABNORMAL LOW (ref 3.87–5.11)
RDW: 15.2 % (ref 11.5–15.5)
WBC: 14.6 K/uL — ABNORMAL HIGH (ref 4.0–10.5)
nRBC: 0 % (ref 0.0–0.2)

## 2024-09-01 LAB — GLUCOSE, CAPILLARY
Glucose-Capillary: 132 mg/dL — ABNORMAL HIGH (ref 70–99)
Glucose-Capillary: 202 mg/dL — ABNORMAL HIGH (ref 70–99)
Glucose-Capillary: 169 mg/dL — ABNORMAL HIGH (ref 70–99)
Glucose-Capillary: 182 mg/dL — ABNORMAL HIGH (ref 70–99)

## 2024-09-01 LAB — BASIC METABOLIC PANEL WITH GFR
Anion gap: 11 (ref 5–15)
BUN: 18 mg/dL (ref 8–23)
CO2: 21 mmol/L — ABNORMAL LOW (ref 22–32)
Calcium: 8.1 mg/dL — ABNORMAL LOW (ref 8.9–10.3)
Chloride: 102 mmol/L (ref 98–111)
Creatinine, Ser: 1.28 mg/dL — ABNORMAL HIGH (ref 0.44–1.00)
GFR, Estimated: 46 mL/min — ABNORMAL LOW
Glucose, Bld: 128 mg/dL — ABNORMAL HIGH (ref 70–99)
Potassium: 4.3 mmol/L (ref 3.5–5.1)
Sodium: 133 mmol/L — ABNORMAL LOW (ref 135–145)

## 2024-09-01 LAB — SURGICAL PATHOLOGY

## 2024-09-01 NOTE — Plan of Care (Signed)
" °  Problem: Education: Goal: Knowledge of General Education information will improve Description: Including pain rating scale, medication(s)/side effects and non-pharmacologic comfort measures Outcome: Progressing   Problem: Health Behavior/Discharge Planning: Goal: Ability to manage health-related needs will improve Outcome: Progressing   Problem: Clinical Measurements: Goal: Ability to maintain clinical measurements within normal limits will improve Outcome: Progressing Goal: Will remain free from infection Outcome: Progressing Goal: Diagnostic test results will improve Outcome: Progressing Goal: Respiratory complications will improve Outcome: Progressing Goal: Cardiovascular complication will be avoided Outcome: Progressing   Problem: Activity: Goal: Risk for activity intolerance will decrease Outcome: Progressing   Problem: Nutrition: Goal: Adequate nutrition will be maintained Outcome: Progressing   Problem: Coping: Goal: Level of anxiety will decrease Outcome: Progressing   Problem: Elimination: Goal: Will not experience complications related to bowel motility Outcome: Progressing Goal: Will not experience complications related to urinary retention Outcome: Progressing   Problem: Pain Managment: Goal: General experience of comfort will improve and/or be controlled Outcome: Progressing   Problem: Safety: Goal: Ability to remain free from injury will improve Outcome: Progressing   Problem: Skin Integrity: Goal: Risk for impaired skin integrity will decrease Outcome: Progressing   Problem: Education: Goal: Knowledge of the prescribed therapeutic regimen will improve Outcome: Progressing   Problem: Bowel/Gastric: Goal: Gastrointestinal status for postoperative course will improve Outcome: Progressing   Problem: Cardiac: Goal: Ability to maintain an adequate cardiac output Outcome: Progressing Goal: Will show no evidence of cardiac arrhythmias Outcome:  Progressing   Problem: Nutritional: Goal: Will attain and maintain optimal nutritional status Outcome: Progressing   Problem: Neurological: Goal: Will regain or maintain usual level of consciousness Outcome: Progressing   Problem: Clinical Measurements: Goal: Ability to maintain clinical measurements within normal limits Outcome: Progressing Goal: Postoperative complications will be avoided or minimized Outcome: Progressing   Problem: Respiratory: Goal: Will regain and/or maintain adequate ventilation Outcome: Progressing Goal: Respiratory status will improve Outcome: Progressing   Problem: Skin Integrity: Goal: Demonstrates signs of wound healing without infection Outcome: Progressing   Problem: Urinary Elimination: Goal: Will remain free from infection Outcome: Progressing Goal: Ability to achieve and maintain adequate urine output Outcome: Progressing   Problem: Education: Goal: Ability to describe self-care measures that may prevent or decrease complications (Diabetes Survival Skills Education) will improve Outcome: Progressing Goal: Individualized Educational Video(s) Outcome: Progressing   Problem: Coping: Goal: Ability to adjust to condition or change in health will improve Outcome: Progressing   Problem: Fluid Volume: Goal: Ability to maintain a balanced intake and output will improve Outcome: Progressing   Problem: Health Behavior/Discharge Planning: Goal: Ability to identify and utilize available resources and services will improve Outcome: Progressing Goal: Ability to manage health-related needs will improve Outcome: Progressing   Problem: Metabolic: Goal: Ability to maintain appropriate glucose levels will improve Outcome: Progressing   Problem: Nutritional: Goal: Maintenance of adequate nutrition will improve Outcome: Progressing Goal: Progress toward achieving an optimal weight will improve Outcome: Progressing   Problem: Skin  Integrity: Goal: Risk for impaired skin integrity will decrease Outcome: Progressing   Problem: Tissue Perfusion: Goal: Adequacy of tissue perfusion will improve Outcome: Progressing   "

## 2024-09-01 NOTE — Progress Notes (Addendum)
 " Hospitalist consult PROGRESS NOTE    Yolanda Gray  FMW:[id number] DOB: November 21, 1955 DOA: 08/30/2024 PCP: [name], NP    Brief Narrative:   69 y.o. female with medical history significant for COPD not on home oxygen, DM, HTN, chronic smoking, paroxysmal A-fib on Eliquis , polycythemia, obesity who was admitted electively on 08/30/2024 for complicated diverticulitis with prior perforation and pericolic abscess.  Patient had a mild AKI with creatinine of 1.26, blood pressure was low in 60s one-time 62/46,.  Hospitalist service was consulted for evaluation for management of medical problems due to hypotension and AKI.  Patient denies any fever, nausea, vomiting, chills, cough, shortness of breath, palpitations, hematuria, dysuria, dizziness, headache.   Patient is postoperative day one of open sigmoidectomy with diverting loop ileostomy and appendectomy for complicated diverticulitis.  Today as mentioned above patient had episode of hypotension and AKI.  Patient was started on IV fluid 75 cc and then upgraded to 100 cc/h.  Patient had a leukocytosis at 13.2, AKI with creatinine of 1.26 baseline is 0.8.   Assessment & Plan:   Principal Problem:   Diverticulitis of intestine with abscess Active Problems:   COPD (chronic obstructive pulmonary disease) (HCC)   Polycythemia   Tobacco abuse   AKI (acute kidney injury)   T2DM (type 2 diabetes mellitus) (HCC)   69 year old female electively admitted on 08/30/2024 for complicated diverticulitis surgery.  She is status post open sigmoidectomy with diverting loop ileostomy and appendectomy for complicated diverticulitis 08/30/24.   1.  Complicated diverticulitis s/p open sigmoidectomy with diverting loop ileostomy - POD #2 - Patient has ostomy - Plan per general surgery   2.  Paroxysmal A-fib/hypertension - Patient received 500 cc of normal saline bolus Plan: Fluids held Hold metoprolol  Continue amiodarone  200 mg twice daily x 3  additional days followed by 200 mg daily Cardiology following  3.  AKI Mild, nonoliguric Renal function improving Plan: Fluids held   4.  History of diabetes Advanced to heart diet Plan: Monitor CBGs AC and at bedtime Continue sliding scale for now If sugars start to climb consider reintroduction of long-acting insulin  Will monitor  5. History of COPD/tobacco abuse/polycythemia No evidence of acute exacerbation Patient on room air Plan: Nebs as needed I-S use  Thank you for the consult.  TRH service to continue following at this time.  Please reach out with questions or concerns.  DVT prophylaxis: Arixtra  Code Status: Full Family Communication: Family member at bedside 1/21 Disposition Plan: Per primary General Surgical team Level of care: Med-Surg  Consultants:  Hospitalist Cardiology   Procedures:  open sigmoidectomy with diverting loop ileostomy  Antimicrobials: None   Subjective: Seen and examined.  Seen in bed.  No complaints.  No distress.  Objective: Vitals:   08/31/24 1544 08/31/24 2057 09/01/24 0401 09/01/24 0733  BP: 101/65 (!) 99/59 (!) 114/56 (!) 112/52  Pulse: 87 93 92 88  Resp:  19 17 15   Temp: 98.6 F (37 C) 98.8 F (37.1 C) 98.8 F (37.1 C) 98.4 F (36.9 C)  TempSrc: Oral Oral Oral   SpO2: 94% 93% 94% 93%    Intake/Output Summary (Last 24 hours) at 09/01/2024 1126 Last data filed at 09/01/2024 0900 Gross per 24 hour  Intake 2711.99 ml  Output 1298 ml  Net 1413.99 ml   There were no vitals filed for this visit.  Examination:  General exam: Appears calm and comfortable  Respiratory system: Clear to auscultation. Respiratory effort normal. Cardiovascular system: S1-S2, RRR, no murmurs,  no pedal edema Gastrointestinal system: Soft, mild TTP, + ileostomy Central nervous system: Alert and oriented. No focal neurological deficits. Extremities: Symmetric 5 x 5 power. Skin: No rashes, lesions or ulcers Psychiatry: Judgement and  insight appear normal. Mood & affect appropriate.     Data Reviewed: I have personally reviewed following labs and imaging studies  CBC: Recent Labs  Lab 08/27/24 0956 08/31/24 0410 09/01/24 0549  WBC 9.1 13.2* 14.6*  HGB 10.7* 9.3* 8.0*  HCT 34.5* 31.0* 26.0*  MCV 86.0 88.3 88.1  PLT 305 332 254   Basic Metabolic Panel: Recent Labs  Lab 08/30/24 1622 08/31/24 0410 09/01/24 0549  NA  --  136 133*  K  --  4.4 4.3  CL  --  101 102  CO2  --  24 21*  GLUCOSE  --  124* 128*  BUN  --  16 18  CREATININE 0.92 1.26* 1.28*  CALCIUM  --  8.4* 8.1*   GFR: Estimated Creatinine Clearance: 45.8 mL/min (A) (by C-G formula based on SCr of 1.28 mg/dL (H)). Liver Function Tests: No results for input(s): AST, ALT, ALKPHOS, BILITOT, PROT, ALBUMIN in the last 168 hours. No results for input(s): LIPASE, AMYLASE in the last 168 hours. No results for input(s): AMMONIA in the last 168 hours. Coagulation Profile: No results for input(s): INR, PROTIME in the last 168 hours. Cardiac Enzymes: No results for input(s): CKTOTAL, CKMB, CKMBINDEX, TROPONINI in the last 168 hours. BNP (last 3 results) No results for input(s): PROBNP in the last 8760 hours. HbA1C: No results for input(s): HGBA1C in the last 72 hours. CBG: Recent Labs  Lab 08/31/24 0721 08/31/24 1100 08/31/24 1727 08/31/24 2105 09/01/24 0734  GLUCAP 169* 175* 135* 156* 132*   Lipid Profile: No results for input(s): CHOL, HDL, LDLCALC, TRIG, CHOLHDL, LDLDIRECT in the last 72 hours. Thyroid Function Tests: No results for input(s): TSH, T4TOTAL, FREET4, T3FREE, THYROIDAB in the last 72 hours. Anemia Panel: No results for input(s): VITAMINB12, FOLATE, FERRITIN, TIBC, IRON, RETICCTPCT in the last 72 hours. Sepsis Labs: No results for input(s): PROCALCITON, LATICACIDVEN in the last 168 hours.  No results found for this or any previous visit (from the past  240 hours).       Radiology Studies: No results found.      Scheduled Meds:  amiodarone   200 mg Oral BID   Followed by   NOREEN ON 09/04/2024] amiodarone   200 mg Oral Daily   celecoxib   200 mg Oral BID   fondaparinux  (ARIXTRA ) injection  2.5 mg Subcutaneous Q24H   gabapentin   300 mg Oral BID   insulin  aspart  0-15 Units Subcutaneous TID WC   pantoprazole  (PROTONIX ) IV  40 mg Intravenous QHS   Continuous Infusions:   LOS: 2 days     Calvin KATHEE Robson, MD [facility]   If 7PM-7AM, please contact night-coverage  09/01/2024, 11:26 AM   "

## 2024-09-01 NOTE — Consult Note (Addendum)
 WOC Nurse ostomy follow up Stoma type/location: RLQ, loop ileostomy Stomal assessment/size: 1 3/4 oval shaped with support rod in place  Peristomal assessment: NA; pouch intact from pouch change yesterday  Treatment options for stomal/peristomal skin: used 2 barrier ring; may need to add 1/2 of barrier ring on the medial aspect between incision and stoma  Output 200cc dark green stool emptied.  Ostomy pouching: 2pc. 2 3/4 in place with 2 barrier ring  Education provided:   Explained stoma characteristics (budded, flush, color, texture, care) Discussion with patient and CG on frequency of pouch change; they are both able to verbalize Set a goal today for patient to empty and use wick; demonstrated this again today  Education on emptying when 1/3 to 1/2 full and how to empty Discussed bathing, diet, gas,diarrhea, dehydration  Discussed food blockage   Discussed risk of peristomal hernia Provided patient with [employer] and marked items currently using Answered patient/family questions:  discussed showering, dehydration and food blockage in detail.  Patient/CG practiced lock and roll closure multiple times. Patient and CG practiced attaching pouch to skin barrier.  CG practiced cutting new skin barrier (had him cut soft convex) for next pouch change.   Reviewed transport planner and marked videos they can watch on their phone for further education  Enrolled patient in [place] Secure Start Discharge program: Yes  PLAN TO SEE PATIENT AGAIN FRIDAY; PATIENT AND CG AWARE  WOC Nurse will follow along with you for continued support with ostomy teaching and care Foch Rosenwald [facility] MSN, RN, [place], CNS, [employer] [phone]

## 2024-09-01 NOTE — Progress Notes (Signed)
 Physical Therapy Treatment Patient Details Name: RICHARD HOLZ MRN: [id number] DOB: June 12, 1956 Today's Date: 09/01/2024   History of Present Illness 69 y.o. female with complicated diverticulitis s/p open sigmoidectomy with diverting loop ileostomy, appendectomy on 08/30/24. PMH: diabetes, recent episode of DKA, new onset of A-fib RVR, COPD, obesity polycythemia, and history of tobacco abuse.    PT Comments  Pt was agreeable to session and extremely cooperative. Pt was able to safely exit bed, stand, and ambulate > 200 ft with RW. She did attempt ambulation short distance without UE support with unsteadiness noted. Pt was encouraged to use AD at DC. Elected to do stairs to simulate home entry next session.  Im excited to eat my first solid meal. Acute PT will continue to follow and progress per current POC.    If plan is discharge home, recommend the following: A little help with walking and/or transfers;A little help with bathing/dressing/bathroom;Help with stairs or ramp for entrance;Assist for transportation     Equipment Recommendations  None recommended by PT (Pt states she has a RW at home to use at DC)       Precautions / Restrictions Precautions Precautions: Fall Recall of Precautions/Restrictions: Intact Restrictions Weight Bearing Restrictions Per Provider Order: No     Mobility  Bed Mobility Overal bed mobility: Modified Independent   Transfers Overall transfer level: Modified independent Equipment used: Rolling walker (2 wheels) Transfers: Sit to/from Stand Sit to Stand: Supervision     Ambulation/Gait Ambulation/Gait assistance: Supervision Gait Distance (Feet): 220 Feet Assistive device: Rolling walker (2 wheels) Gait Pattern/deviations: Step-through pattern Gait velocity: decreased  General Gait Details: Pt ambulated > 200 ft with RW then ambulated form doorway of room to bed without AD. encouraged pt to continue to use AD at DC but will attempt further gait  distances without AD next PT session. pt 's first solid food meal tray arrived and pt eager to attempt to eat it   Stairs Stairs:  (deferred to next PT sesison)      Balance Overall balance assessment: Needs assistance Sitting-balance support: Feet supported Sitting balance-Leahy Scale: Good     Standing balance support: During functional activity, No upper extremity supported Standing balance-Leahy Scale: Fair Standing balance comment: Did have one small LOB without use of AD during short gait distance      Communication Communication Communication: No apparent difficulties  Cognition Arousal: Alert Behavior During Therapy: WFL for tasks assessed/performed   PT - Cognitive impairments: No apparent impairments   PT - Cognition Comments: Pt is A and O x 4. cooperative and motivated. Did not want to attempt stairs today but was willing to ambulate prior to attempting to eat first solid meal Following commands: Intact      Cueing Cueing Techniques: Verbal cues         Pertinent Vitals/Pain Pain Assessment Pain Assessment: 0-10 Pain Score: 2  Pain Location: abdomen Pain Descriptors / Indicators: Discomfort Pain Intervention(s): Limited activity within patient's tolerance, Monitored during session, Premedicated before session, Repositioned     PT Goals (current goals can now be found in the care plan section) Acute Rehab PT Goals Patient Stated Goal: get well and go home Progress towards PT goals: Progressing toward goals    Frequency    Min 2X/week           Co-evaluation     PT goals addressed during session: Mobility/safety with mobility;Balance;Proper use of DME;Strengthening/ROM        AM-PAC PT 6 Clicks Mobility  Outcome Measure  Help needed turning from your back to your side while in a flat bed without using bedrails?: A Little Help needed moving from lying on your back to sitting on the side of a flat bed without using bedrails?: A  Little Help needed moving to and from a bed to a chair (including a wheelchair)?: A Little Help needed standing up from a chair using your arms (e.g., wheelchair or bedside chair)?: A Little Help needed to walk in hospital room?: A Little Help needed climbing 3-5 steps with a railing? : A Little 6 Click Score: 18    End of Session   Activity Tolerance: Patient tolerated treatment well;Patient limited by fatigue Patient left: Other (comment) (seated EOB eating with spouse present) Nurse Communication: Mobility status PT Visit Diagnosis: Other abnormalities of gait and mobility (R26.89);Other symptoms and signs involving the nervous system (R29.898)     Time: 8655-8645 PT Time Calculation (min) (ACUTE ONLY): 10 min  Charges:    $Gait Training: 8-22 mins PT General Charges $$ ACUTE PT VISIT: 1 Visit          Rankin Essex PTA 09/01/24, 2:08 PM

## 2024-09-01 NOTE — Progress Notes (Signed)
 [facility]- General Surgery  SURGICAL PROGRESS NOTE  Hospital Day(s): 2.   Post op day(s): 2 Days Post-Op.   Interval History:  Doing well.  Experiencing some left shoulder pain, otherwise appeared comfortable during encounter. Abdominal discomfort is controlled, no worsening pain. Ileostomy output is increasing as expected. Patient has been tolerating full liquid diets, denies any nausea or vomiting. Recorded drain output in the last 24 hours: 85 cc  Vital signs in last 24 hours: [min-max] current  Temp:  [97.4 F (36.3 C)-98.8 F (37.1 C)] 98.4 F (36.9 C) (01/21 0733) Pulse Rate:  [64-93] 88 (01/21 0733) Resp:  [15-22] 15 (01/21 0733) BP: (62-114)/(46-65) 112/52 (01/21 0733) SpO2:  [93 %-97 %] 93 % (01/21 0733)             Intake/Output last 2 shifts:  01/20 0701 - 01/21 0700 In: 2832 [P.O.:1080; I.V.:1752] Out: 573 [Urine:408; Drains:50; Stool:115]   Physical Exam:  Constitutional: alert, cooperative and no distress  Respiratory: breathing non-labored at rest  Cardiovascular: regular rate and sinus rhythm  Gastrointestinal: soft, mildly tender, and non-distended,, dressing on midline incision is clean and dry.  Serosanguineous output noted in JP drain.  Little stool output noted in ileostomy bag.   Labs:     Latest Ref Rng & Units 09/01/2024    5:49 AM 08/31/2024    4:10 AM 08/27/2024    9:56 AM  CBC  WBC 4.0 - 10.5 K/uL 14.6  13.2  9.1   Hemoglobin 12.0 - 15.0 g/dL 8.0  9.3  89.2   Hematocrit 36.0 - 46.0 % 26.0  31.0  34.5   Platelets 150 - 400 K/uL 254  332  305       Latest Ref Rng & Units 09/01/2024    5:49 AM 08/31/2024    4:10 AM 08/30/2024    4:22 PM  CMP  Glucose 70 - 99 mg/dL 871  875    BUN 8 - 23 mg/dL 18  16    Creatinine 9.55 - 1.00 mg/dL 8.71  8.73  9.07   Sodium 135 - 145 mmol/L 133  136    Potassium 3.5 - 5.1 mmol/L 4.3  4.4    Chloride 98 - 111 mmol/L 102  101    CO2 22 - 32 mmol/L 21  24    Calcium 8.9 - 10.3 mg/dL 8.1  8.4       Imaging studies: No new pertinent imaging studies   Assessment/Plan:  69 y.o. female with complicated diverticulitis 2 Days Post-Op s/p open sigmoidectomy with diverting loop ileostomy, appendectomy, complicated by pertinent comorbidities including diabetes, recent episode of DKA, new onset of A-fib RVR, COPD, obesity polycythemia, and history of tobacco abuse.    - Still no fever, not tachycardic.Systolic blood pressure improving, will continue to monitor diastolic blood pressure. Still remains asymptomatic.  Abdominal pain is better controlled.  - Leukocytosis trending up 13.2 >> 14.6, no signs of infection.  Will continue to monitor. Serosanguineous output in JP drain.  - No concerns of active bleeding at this time.  Will continue to monitor hemoglobin levels.  - Advance to soft diet.  Discontinue IV fluids  - Continue to monitor creatinine levels  - Follow cardiology and hospitalist recommendations  -- Dulse Rutan Barrientos PA-C

## 2024-09-01 NOTE — Progress Notes (Signed)
 " [facility] CLINIC CARDIOLOGY PROGRESS NOTE       Patient ID: Yolanda Gray MRN: [id number] DOB/AGE: Apr 03, 1956 69 y.o.  Admit date: 08/30/2024 Referring Physician Gilmer Hong, [place] Primary Physician Gauger, Lauraine Collar, NP  Primary Cardiologist Dr. Dewane Reason for Consultation Hypotension  HPI: Yolanda Gray is a 69 y.o. female  with a past medical history of paroxysmal atrial fibrillation, COPD, tobacco use, polycythemia, obesity who presented for outpatient surgery on 08/30/2024 for complicated diverticulitis with prior perforation and pericolic abscess. Hypotensive this AM. Cardiology was consulted for further evaluation.   Interval history: - Patient seen and examined this morning, resting comfortably in hospital bed with family at bedside. - States that she is feeling okay today, still endorses some abdominal soreness.  Denies any chest discomfort, shortness of breath, lightheadedness or dizziness. - BP overall improved after IV fluids yesterday.  Review of systems complete and found to be negative unless listed above    Past Medical History:  Diagnosis Date   COPD (chronic obstructive pulmonary disease) (HCC)    Diabetes mellitus without complication (HCC)    Diverticulitis of large intestine with perforation without bleeding    Edentulous    Morbid obesity with BMI of 40.0-44.9, adult (HCC)    Obesity (BMI 30-39.9)    Polycythemia    Thrombocytopenia    Tobacco abuse    Tobacco abuse     Past Surgical History:  Procedure Laterality Date   CATARACT EXTRACTION W/PHACO Left 10/09/2023   Procedure: CATARACT EXTRACTION PHACO AND INTRAOCULAR LENS PLACEMENT (IOC) LEFT MALYUGIN OMIDRIA  13.44 01:09.5;  Surgeon: Enola Feliciano Hugger, MD;  Location: [facility];  Service: Ophthalmology;  Laterality: Left;   CATARACT EXTRACTION W/PHACO Right 10/30/2023   Procedure: PHACOEMULSIFICATION, CATARACT, WITH IOL INSERTION 6.65, 00:53.8;  Surgeon: Enola Feliciano Hugger, MD;   Location: [facility];  Service: Ophthalmology;  Laterality: Right;   CESAREAN SECTION     COLONOSCOPY N/A 08/25/2024   Procedure: COLONOSCOPY;  Surgeon: Tye Millet, DO;  Location: [facility] ENDOSCOPY;  Service: General;  Laterality: N/A;   CYSTOSCOPY WITH INDOCYANINE GREEN  IMAGING (ICG) N/A 08/30/2024   Procedure: CYSTOSCOPY WITH INDOCYANINE GREEN  IMAGING (ICG);  Surgeon: Francisca Redell BROCKS, MD;  Location: [facility] ORS;  Service: Urology;  Laterality: N/A;   finger laceration     POLYPECTOMY  08/25/2024   Procedure: POLYPECTOMY, INTESTINE;  Surgeon: Tye Millet, DO;  Location: [facility] ENDOSCOPY;  Service: General;;   TONSILLECTOMY      Medications Prior to Admission  Medication Sig Dispense Refill Last Dose/Taking   Glucagon  (BAQSIMI  TWO PACK) 3 MG/DOSE POWD Place 3 mg into the nose as needed for up to 2 doses (Severe low blood sugar). Give 3 mg (one actuation) into a single nostril. 2 each 0 Unknown   ibuprofen (ADVIL) 600 MG tablet Take 600 mg by mouth every 6 (six) hours as needed (pain).   Past Week   insulin  glargine (LANTUS ) 100 UNIT/ML Solostar Pen Inject 15 Units into the skin daily. May substitute as needed per insurance. (Patient taking differently: Inject 8 Units into the skin in the morning. May substitute as needed per insurance.) 15 mL 0 08/29/2024   metoprolol  succinate (TOPROL -XL) 25 MG 24 hr tablet Take 25 mg by mouth at bedtime.   Past Week   amiodarone  (PACERONE ) 200 MG tablet Take 1 tablet (200 mg total) by mouth 2 (two) times daily for 9 days, THEN 1 tablet (200 mg total) daily for 14 days. (Patient not taking: Reported on 08/18/2024)  32 tablet 0 Not Taking   apixaban  (ELIQUIS ) 5 MG TABS tablet Take 1 tablet (5 mg total) by mouth 2 (two) times daily. (Patient not taking: Reported on 08/18/2024) 60 tablet 2 Not Taking   Blood Glucose Monitoring Suppl (BLOOD GLUCOSE MONITOR SYSTEM) w/Device KIT 1 each by Does not apply route as directed. Dispense based on patient and insurance preference.  Use up to four times daily as directed. (FOR ICD-10 E10.9, E11.9). 1 kit 0    Glucose Blood (BLOOD GLUCOSE TEST STRIPS) STRP 1 each by Does not apply route as directed. Dispense based on patient and insurance preference. Use up to four times daily as directed. (FOR ICD-10 E10.9, E11.9). 100 each 0    insulin  aspart (NOVOLOG ) 100 UNIT/ML FlexPen Inject 0-6 Units into the skin 3 (three) times daily with meals. Check Blood Glucose (BG) and inject per scale: BG <150= 0 unit; BG 150-200= 1 unit; BG 201-250= 2 unit; BG 251-300= 3 unit; BG 301-350= 4 unit; BG 351-400= 5 unit; BG >400= 6 unit and Call Primary Care. 15 mL 0 Not Taking   Insulin  Pen Needle 32G X 4 MM MISC 1 each by Does not apply route as directed. Dispense based on patient and insurance preference. Use up to four times daily as directed. (FOR ICD-10 E10.9, E11.9). 100 each 0    Lancet Device MISC 1 each by Does not apply route as directed. Dispense based on patient and insurance preference. Use up to four times daily as directed. (FOR ICD-10 E10.9, E11.9). 1 each 0    Lancets MISC 1 each by Does not apply route as directed. Dispense based on patient and insurance preference. Use up to four times daily as directed. (FOR ICD-10 E10.9, E11.9). 100 each 0    pantoprazole  (PROTONIX ) 40 MG tablet Take 1 tablet (40 mg total) by mouth 2 (two) times daily before a meal for 14 days. (Patient not taking: Reported on 08/18/2024) 28 tablet 0 Not Taking   sodium chloride  flush 0.9 % SOLN injection Use 10 mLs by Intracatheter route daily. 300 mL 0    Social History   Socioeconomic History   Marital status: Married    Spouse name: Renstrom,GARLAND   Number of children: Not on file   Years of education: Not on file   Highest education level: Not on file  Occupational History   Not on file  Tobacco Use   Smoking status: Former    Current packs/day: 0.00    Types: Cigarettes    Quit date: 2022    Years since quitting: 4.0   Smokeless tobacco: Never  Vaping  Use   Vaping status: Never Used  Substance and Sexual Activity   Alcohol use: Not Currently   Drug use: Never   Sexual activity: Not on file  Other Topics Concern   Not on file  Social History Narrative   Daughter and 3 grand kids in home   Social Drivers of Health   Tobacco Use: Medium Risk (08/30/2024)   Patient History    Smoking Tobacco Use: Former    Smokeless Tobacco Use: Never    Passive Exposure: Not on file  Financial Resource Strain: High Risk (08/16/2024)   Received from [facility]   Overall Financial Resource Strain (CARDIA)    Difficulty of Paying Living Expenses: Hard  Food Insecurity: No Food Insecurity (08/30/2024)   Epic    Worried About Running Out of Food in the Last Year: Never true    Ran  Out of Food in the Last Year: Never true  Recent Concern: Food Insecurity - Food Insecurity Present (08/16/2024)   Received from [facility]   Epic    Within the past 12 months, you worried that your food would run out before you got the money to buy more.: Sometimes true    Within the past 12 months, the food you bought just didn't last and you didn't have money to get more.: Never true  Transportation Needs: No Transportation Needs (08/30/2024)   Epic    Lack of Transportation (Medical): No    Lack of Transportation (Non-Medical): No  Recent Concern: Transportation Needs - Unmet Transportation Needs (07/13/2024)   Received from [facility] - Transportation    In the past 12 months, has lack of transportation kept you from medical appointments or from getting medications?: Yes    Lack of Transportation (Non-Medical): Yes  Physical Activity: Insufficiently Active (08/16/2024)   Received from [facility]   Exercise Vital Sign    On average, how many days per week do you engage in moderate to strenuous exercise (like a brisk walk)?: 7 days    On average, how many minutes do you engage in exercise  at this level?: 20 min  Stress: No Stress Concern Present (08/16/2024)   Received from [facility] - Occupational Stress Questionnaire    Feeling of Stress : Not at all  Social Connections: Moderately Integrated (08/30/2024)   Social Connection and Isolation Panel    Frequency of Communication with Friends and Family: More than three times a week    Frequency of Social Gatherings with Friends and Family: More than three times a week    Attends Religious Services: More than 4 times per year    Active Member of [employer] or Organizations: No    Attends Banker Meetings: Never    Marital Status: Married  Catering Manager Violence: Not At Risk (08/30/2024)   Epic    Fear of Current or Ex-Partner: No    Emotionally Abused: No    Physically Abused: No    Sexually Abused: No  Depression (PHQ2-9): Low Risk (08/10/2024)   Depression (PHQ2-9)    PHQ-2 Score: 0  Alcohol Screen: Not on file  Housing: Low Risk (08/30/2024)   Epic    Unable to Pay for Housing in the Last Year: No    Number of Times Moved in the Last Year: 0    Homeless in the Last Year: No  Recent Concern: Housing - High Risk (08/16/2024)   Received from [facility]   Epic    In the last 12 months, was there a time when you were not able to pay the mortgage or rent on time?: Yes    In the past 12 months, how many times have you moved where you were living?: 0    At any time in the past 12 months, were you homeless or living in a shelter (including now)?: No  Utilities: Not At Risk (08/30/2024)   Epic    Threatened with loss of utilities: No  Recent Concern: Utilities - At Risk (08/16/2024)   Received from [facility]   Epic    In the past 12 months has the electric, gas, oil, or water  company threatened to shut off services in your home?: Yes  Health Literacy: Adequate Health Literacy (08/16/2024)  Received from [facility]   [id number] Health Literacy    How often do you need to have someone help you when you read instructions, pamphlets, or other written material from your doctor or pharmacy?: Never    Family History  Problem Relation Age of Onset   Asthma Mother    Heart attack Mother    Heart attack Father      Vitals:   08/31/24 1544 08/31/24 2057 09/01/24 0401 09/01/24 0733  BP: 101/65 (!) 99/59 (!) 114/56 (!) 112/52  Pulse: 87 93 92 88  Resp:  19 17 15   Temp: 98.6 F (37 C) 98.8 F (37.1 C) 98.8 F (37.1 C) 98.4 F (36.9 C)  TempSrc: Oral Oral Oral   SpO2: 94% 93% 94% 93%    PHYSICAL EXAM General: Well appearing female, well nourished, in no acute distress. HEENT: Normocephalic and atraumatic. Neck: No JVD.  Lungs: Normal respiratory effort on room air. Clear bilaterally to auscultation. No wheezes, crackles, rhonchi.  Heart: HRRR. Normal S1 and S2 without gallops or murmurs.  Abdomen: Non-distended appearing.  Msk: Normal strength and tone for age. Extremities: Warm and well perfused. No clubbing, cyanosis. No edema.  Neuro: Alert and oriented X 3. Psych: Answers questions appropriately.   Labs: Basic Metabolic Panel: Recent Labs    08/31/24 0410 09/01/24 0549  NA 136 133*  K 4.4 4.3  CL 101 102  CO2 24 21*  GLUCOSE 124* 128*  BUN 16 18  CREATININE 1.26* 1.28*  CALCIUM 8.4* 8.1*   Liver Function Tests: No results for input(s): AST, ALT, ALKPHOS, BILITOT, PROT, ALBUMIN in the last 72 hours. No results for input(s): LIPASE, AMYLASE in the last 72 hours. CBC: Recent Labs    08/31/24 0410 09/01/24 0549  WBC 13.2* 14.6*  HGB 9.3* 8.0*  HCT 31.0* 26.0*  MCV 88.3 88.1  PLT 332 254   Cardiac Enzymes: No results for input(s): CKTOTAL, CKMB, CKMBINDEX, TROPONINIHS in the last 72 hours. BNP: No results for input(s): BNP in the last 72 hours. D-Dimer: No results for input(s): DDIMER in the last 72 hours. Hemoglobin A1C: No results  for input(s): HGBA1C in the last 72 hours. Fasting Lipid Panel: No results for input(s): CHOL, HDL, LDLCALC, TRIG, CHOLHDL, LDLDIRECT in the last 72 hours. Thyroid Function Tests: No results for input(s): TSH, T4TOTAL, T3FREE, THYROIDAB in the last 72 hours.  Invalid input(s): FREET3 Anemia Panel: No results for input(s): VITAMINB12, FOLATE, FERRITIN, TIBC, IRON, RETICCTPCT in the last 72 hours.   Radiology: No results found.  ECHO 06/2024: 1. Left ventricular ejection fraction, by estimation, is 60 to 65%. The left ventricle has normal function. The left ventricle has no regional wall motion abnormalities. Left ventricular diastolic parameters are indeterminate.   2. Right ventricular systolic function is normal. The right ventricular  size is normal. There is normal pulmonary artery systolic pressure. The  estimated right ventricular systolic pressure is 30.0 mmHg.   3. The mitral valve is normal in structure. No evidence of mitral valve  regurgitation. No evidence of mitral stenosis.   4. The aortic valve is normal in structure. Aortic valve regurgitation is  not visualized. No aortic stenosis is present.   5. The inferior vena cava is normal in size with greater than 50%  respiratory variability, suggesting right atrial pressure of 3 mmHg.   TELEMETRY (personally reviewed): not on tele  EKG 08/27/24 (personally reviewed): NSR rate 89 bpm  Data reviewed by me 09/01/2024: last 24h vitals  tele labs imaging I/O ED provider note, admission H&P, general surgery notes  Principal Problem:   Diverticulitis of intestine with abscess Active Problems:   COPD (chronic obstructive pulmonary disease) (HCC)   Polycythemia   Tobacco abuse   AKI (acute kidney injury)   T2DM (type 2 diabetes mellitus) (HCC)    ASSESSMENT AND PLAN:  Yolanda Gray is a 69 y.o. female  with a past medical history of paroxysmal atrial fibrillation, COPD, tobacco use,  polycythemia, obesity who presented for outpatient surgery on 08/30/2024 for complicated diverticulitis with prior perforation and pericolic abscess. Hypotensive this AM. Cardiology was consulted for further evaluation.   # Hypotension # POD #1 open sigmoidectomy with loop ileostomy/appendectomy # Paroxysmal atrial fibrillation Patient had surgery yesterday and was recovering well until developing hypotension on morning of 08/31/24. Given 500 cc bolus and started on continuous IVF. She is asymptomatic.  - S/p IV fluid bolus and continuous infusion overnight.  Discontinuing this a.m. per general surgery. -Hold metoprolol . Consider resuming prior to DC pending BP.  -Continue amiodarone  200 mg twice daily for 4 more days followed by 200 mg daily.  -Continue holding Eliquis  until OK to resume per general surgery.    This patient's plan of care was discussed and created with Dr. Florencio and he is in agreement.  Signed: Danita Bloch, PA-C  09/01/2024, 8:46 AM [facility] Cardiology      "

## 2024-09-01 NOTE — Progress Notes (Signed)
 PT Cancellation Note  Patient Details Name: Yolanda Gray MRN: [id number] DOB: 1956-01-06   Cancelled Treatment:     PT attempt. Pt was I'ly seated EOB upon arrival with supportive family member at bedside. She agrees to session and ambulation however upon standing, realized ostomy was leaking and gown and linens needed to be changed. RN was made aware. Lunch tray then arrived. Acute PT will return at a later time.    Rankin KATHEE Essex 09/01/2024, 1:25 PM

## 2024-09-02 DIAGNOSIS — K5780 Diverticulitis of intestine, part unspecified, with perforation and abscess without bleeding: Secondary | ICD-10-CM | POA: Diagnosis not present

## 2024-09-02 LAB — GLUCOSE, CAPILLARY
Glucose-Capillary: 140 mg/dL — ABNORMAL HIGH (ref 70–99)
Glucose-Capillary: 182 mg/dL — ABNORMAL HIGH (ref 70–99)
Glucose-Capillary: 149 mg/dL — ABNORMAL HIGH (ref 70–99)
Glucose-Capillary: 160 mg/dL — ABNORMAL HIGH (ref 70–99)

## 2024-09-02 LAB — CBC
HCT: 25.3 % — ABNORMAL LOW (ref 36.0–46.0)
Hemoglobin: 7.7 g/dL — ABNORMAL LOW (ref 12.0–15.0)
MCH: 27.1 pg (ref 26.0–34.0)
MCHC: 30.4 g/dL (ref 30.0–36.0)
MCV: 89.1 fL (ref 80.0–100.0)
Platelets: 254 K/uL (ref 150–400)
RBC: 2.84 MIL/uL — ABNORMAL LOW (ref 3.87–5.11)
RDW: 15.2 % (ref 11.5–15.5)
WBC: 11.3 K/uL — ABNORMAL HIGH (ref 4.0–10.5)
nRBC: 0 % (ref 0.0–0.2)

## 2024-09-02 LAB — BASIC METABOLIC PANEL WITH GFR
Anion gap: 8 (ref 5–15)
BUN: 16 mg/dL (ref 8–23)
CO2: 24 mmol/L (ref 22–32)
Calcium: 8.1 mg/dL — ABNORMAL LOW (ref 8.9–10.3)
Chloride: 105 mmol/L (ref 98–111)
Creatinine, Ser: 1.01 mg/dL — ABNORMAL HIGH (ref 0.44–1.00)
GFR, Estimated: >60 mL/min
Glucose, Bld: 146 mg/dL — ABNORMAL HIGH (ref 70–99)
Potassium: 4.5 mmol/L (ref 3.5–5.1)
Sodium: 137 mmol/L (ref 135–145)

## 2024-09-02 MED ORDER — GLUCERNA SHAKE PO LIQD
237.0000 mL | Freq: Three times a day (TID) | ORAL | Status: DC
  Administered 2024-09-02 – 2024-09-03 (×3): 237 mL via ORAL

## 2024-09-02 MED ORDER — ADULT MULTIVITAMIN W/MINERALS CH
1.0000 | ORAL_TABLET | Freq: Every day | ORAL | Status: DC
  Administered 2024-09-03: 1 via ORAL
  Filled 2024-09-02: qty 1

## 2024-09-02 NOTE — Plan of Care (Signed)
" °  Problem: Education: Goal: Knowledge of General Education information will improve Description: Including pain rating scale, medication(s)/side effects and non-pharmacologic comfort measures Outcome: Progressing   Problem: Health Behavior/Discharge Planning: Goal: Ability to manage health-related needs will improve Outcome: Progressing   Problem: Clinical Measurements: Goal: Ability to maintain clinical measurements within normal limits will improve Outcome: Progressing Goal: Will remain free from infection Outcome: Progressing Goal: Diagnostic test results will improve Outcome: Progressing Goal: Cardiovascular complication will be avoided Outcome: Progressing   Problem: Activity: Goal: Risk for activity intolerance will decrease Outcome: Progressing   Problem: Nutrition: Goal: Adequate nutrition will be maintained Outcome: Progressing   Problem: Coping: Goal: Level of anxiety will decrease Outcome: Progressing   Problem: Elimination: Goal: Will not experience complications related to urinary retention Outcome: Progressing   Problem: Pain Managment: Goal: General experience of comfort will improve and/or be controlled Outcome: Progressing   Problem: Safety: Goal: Ability to remain free from injury will improve Outcome: Progressing   Problem: Education: Goal: Knowledge of the prescribed therapeutic regimen will improve Outcome: Progressing   Problem: Bowel/Gastric: Goal: Gastrointestinal status for postoperative course will improve Outcome: Progressing   Problem: Cardiac: Goal: Ability to maintain an adequate cardiac output Outcome: Progressing   Problem: Nutritional: Goal: Will attain and maintain optimal nutritional status Outcome: Progressing   Problem: Neurological: Goal: Will regain or maintain usual level of consciousness Outcome: Progressing   Problem: Clinical Measurements: Goal: Ability to maintain clinical measurements within normal  limits Outcome: Progressing Goal: Postoperative complications will be avoided or minimized Outcome: Progressing   Problem: Respiratory: Goal: Respiratory status will improve Outcome: Progressing   Problem: Fluid Volume: Goal: Ability to maintain a balanced intake and output will improve Outcome: Progressing   Problem: Metabolic: Goal: Ability to maintain appropriate glucose levels will improve Outcome: Progressing   "

## 2024-09-02 NOTE — Progress Notes (Signed)
 [facility]- General Surgery  SURGICAL PROGRESS NOTE  Hospital Day(s): 3.   Post op day(s): 3 Days Post-Op.   Interval History:  Patient is overall recovering well.  Abdominal pain remains minimal.  Has tolerated solid food.  Denies any nausea or vomiting. Education on ileostomy was provided by wound care nurse yesterday. Recorded output from ileostomy: 520 cc  Vital signs in last 24 hours: [min-max] current  Temp:  [97.6 F (36.4 C)-98.1 F (36.7 C)] 98.1 F (36.7 C) (01/22 0722) Pulse Rate:  [79-91] 81 (01/22 0722) Resp:  [16-18] 16 (01/22 0722) BP: (96-124)/(44-59) 124/59 (01/22 0722) SpO2:  [95 %-96 %] 95 % (01/22 0722)             Intake/Output last 2 shifts:  01/21 0701 - 01/22 0700 In: 1140 [P.O.:1140] Out: 865 [Urine:300; Drains:45; Stool:520]   Physical Exam:  Constitutional: alert, cooperative and no distress  Respiratory: breathing non-labored at rest  Cardiovascular: regular rate and sinus rhythm  Gastrointestinal: soft, non-tender, and non-distended, skin staples intact.  Incisions are clean and dry. Good stool output in ileostomy with gas present.  Minimal serosanguineous output in drain.  Labs:     Latest Ref Rng & Units 09/02/2024    4:28 AM 09/01/2024    5:49 AM 08/31/2024    4:10 AM  CBC  WBC 4.0 - 10.5 K/uL 11.3  14.6  13.2   Hemoglobin 12.0 - 15.0 g/dL 7.7  8.0  9.3   Hematocrit 36.0 - 46.0 % 25.3  26.0  31.0   Platelets 150 - 400 K/uL 254  254  332       Latest Ref Rng & Units 09/02/2024    4:28 AM 09/01/2024    5:49 AM 08/31/2024    4:10 AM  CMP  Glucose 70 - 99 mg/dL 853  871  875   BUN 8 - 23 mg/dL 16  18  16    Creatinine 0.44 - 1.00 mg/dL 8.98  8.71  8.73   Sodium 135 - 145 mmol/L 137  133  136   Potassium 3.5 - 5.1 mmol/L 4.5  4.3  4.4   Chloride 98 - 111 mmol/L 105  102  101   CO2 22 - 32 mmol/L 24  21  24    Calcium 8.9 - 10.3 mg/dL 8.1  8.1  8.4     Imaging studies: No new pertinent imaging studies   Assessment/Plan:  69 y.o.  female with complicated diverticulitis 3 Days Post-Op s/p open sigmoidectomy with diverting loop ileostomy, appendectomy, complicated by pertinent comorbidities including diabetes, recent episode of DKA, new onset of A-fib RVR, COPD, obesity polycythemia, and history of tobacco abuse.    - Stable vital signs, no fever not tachycardic with BP better controlled.  - Leukocytosis improving 14.6 >> 11.3  - Continue to monitor Hbg levels   - Continue heart healthy diet  - Creatinine levels improving  - Will likely remove JP drain prior to discharge since output has remained serosanguineous and is decreasing. Will reassess tomorrow. Patient is recovering adequately.  Encouraged nurse planning to see patient again tomorrow for ostomy teaching and care.  Continue to follow their recommendations.   A-fib  - Per cardiology team, holding metoprolol  until likely tomorrow  - Continue amiodarone  200 mg twice daily for 2 more days followed by 200 mg daily  - Could likely resume Eliquis  once hemoglobin is no longer trending down  -- Dicie Edelen Barrientos PA-C

## 2024-09-02 NOTE — Plan of Care (Signed)
" °  Problem: Education: Goal: Knowledge of General Education information will improve Description: Including pain rating scale, medication(s)/side effects and non-pharmacologic comfort measures Outcome: Progressing   Problem: Health Behavior/Discharge Planning: Goal: Ability to manage health-related needs will improve Outcome: Progressing   Problem: Clinical Measurements: Goal: Ability to maintain clinical measurements within normal limits will improve Outcome: Progressing Goal: Will remain free from infection Outcome: Progressing Goal: Diagnostic test results will improve Outcome: Progressing Goal: Respiratory complications will improve Outcome: Progressing Goal: Cardiovascular complication will be avoided Outcome: Progressing   Problem: Activity: Goal: Risk for activity intolerance will decrease Outcome: Progressing   Problem: Nutrition: Goal: Adequate nutrition will be maintained Outcome: Progressing   Problem: Coping: Goal: Level of anxiety will decrease Outcome: Progressing   Problem: Elimination: Goal: Will not experience complications related to bowel motility Outcome: Progressing Goal: Will not experience complications related to urinary retention Outcome: Progressing   Problem: Pain Managment: Goal: General experience of comfort will improve and/or be controlled Outcome: Progressing   Problem: Safety: Goal: Ability to remain free from injury will improve Outcome: Progressing   Problem: Skin Integrity: Goal: Risk for impaired skin integrity will decrease Outcome: Progressing   Problem: Education: Goal: Knowledge of the prescribed therapeutic regimen will improve Outcome: Progressing   Problem: Bowel/Gastric: Goal: Gastrointestinal status for postoperative course will improve Outcome: Progressing   Problem: Cardiac: Goal: Ability to maintain an adequate cardiac output Outcome: Progressing Goal: Will show no evidence of cardiac arrhythmias Outcome:  Progressing   Problem: Nutritional: Goal: Will attain and maintain optimal nutritional status Outcome: Progressing   Problem: Neurological: Goal: Will regain or maintain usual level of consciousness Outcome: Progressing   Problem: Clinical Measurements: Goal: Ability to maintain clinical measurements within normal limits Outcome: Progressing Goal: Postoperative complications will be avoided or minimized Outcome: Progressing   Problem: Respiratory: Goal: Will regain and/or maintain adequate ventilation Outcome: Progressing Goal: Respiratory status will improve Outcome: Progressing   Problem: Skin Integrity: Goal: Demonstrates signs of wound healing without infection Outcome: Progressing   Problem: Urinary Elimination: Goal: Will remain free from infection Outcome: Progressing Goal: Ability to achieve and maintain adequate urine output Outcome: Progressing   Problem: Metabolic: Goal: Ability to maintain appropriate glucose levels will improve Outcome: Progressing   Problem: Nutritional: Goal: Maintenance of adequate nutrition will improve Outcome: Progressing Goal: Progress toward achieving an optimal weight will improve Outcome: Progressing   Problem: Skin Integrity: Goal: Risk for impaired skin integrity will decrease Outcome: Progressing   "

## 2024-09-02 NOTE — Progress Notes (Signed)
 Occupational Therapy Treatment Patient Details Name: Yolanda Gray MRN: [id number] DOB: February 19, 1956 Today's Date: 09/02/2024   History of present illness 69 y.o. female with complicated diverticulitis s/p open sigmoidectomy with diverting loop ileostomy, appendectomy on 08/30/24. PMH: diabetes, recent episode of DKA, new onset of A-fib RVR, COPD, obesity polycythemia, and history of tobacco abuse.   OT comments  Pt seen for OT treatment on this date. Upon arrival to room pt seated EOB, agreeable to tx. Pt IND for sit to stand and SUP ambulating ~39ft, no AD use. Pt requires min cues for LB dressing. Pt edu on functional application of abdominal pcns for comfort with dressing/bathing, DME recs, d/c recs, and falls safety. Pt making good progress toward goals, all education complete, no acute OT needs, will sign off. Discharge recommendation updated to reflect pt progress.        If plan is discharge home, recommend the following:  Assist for transportation;Help with stairs or ramp for entrance;Assistance with cooking/housework;A little help with walking and/or transfers   Equipment Recommendations  None recommended by OT    Recommendations for Other Services      Precautions / Restrictions Precautions Precautions: Fall Recall of Precautions/Restrictions: Intact Restrictions Weight Bearing Restrictions Per Provider Order: No       Mobility Bed Mobility               General bed mobility comments: Not tested    Transfers Overall transfer level: Independent Equipment used: None Transfers: Sit to/from Stand                 Balance Overall balance assessment: Modified Independent Sitting-balance support: Feet supported Sitting balance-Leahy Scale: Good     Standing balance support: During functional activity, No upper extremity supported Standing balance-Leahy Scale: Good                             ADL either performed or assessed with clinical  judgement   ADL Overall ADL's : Needs assistance/impaired                                       General ADL Comments: Pt needed min cues for LB dressing. Pt edu on strategies when returing home on LB dressing.    Extremity/Trunk Assessment Upper Extremity Assessment Upper Extremity Assessment: Overall WFL for tasks assessed   Lower Extremity Assessment Lower Extremity Assessment: Overall WFL for tasks assessed        Vision       Perception     Praxis     Communication Communication Communication: No apparent difficulties   Cognition Arousal: Alert Behavior During Therapy: WFL for tasks assessed/performed Cognition: No apparent impairments                               Following commands: Intact        Cueing   Cueing Techniques: Verbal cues  Exercises      Shoulder Instructions       General Comments      Pertinent Vitals/ Pain       Pain Assessment Pain Assessment: No/denies pain  Home Living  Prior Functioning/Environment              Frequency  Min 1X/week        Progress Toward Goals  OT Goals(current goals can now be found in the care plan section)  Progress towards OT goals: Goals met/education completed, patient discharged from OT  Acute Rehab OT Goals Patient Stated Goal: go home OT Goal Formulation: With patient/family Time For Goal Achievement: 09/16/24 Potential to Achieve Goals: Good  Plan      Co-evaluation                 AM-PAC OT 6 Clicks Daily Activity     Outcome Measure   Help from another person eating meals?: None Help from another person taking care of personal grooming?: None Help from another person toileting, which includes using toliet, bedpan, or urinal?: None Help from another person bathing (including washing, rinsing, drying)?: A Little Help from another person to put on and taking off regular upper  body clothing?: None Help from another person to put on and taking off regular lower body clothing?: A Little 6 Click Score: 22    End of Session    OT Visit Diagnosis: Muscle weakness (generalized) (M62.81);Other abnormalities of gait and mobility (R26.89)   Activity Tolerance Patient tolerated treatment well   Patient Left with family/visitor present;Other (comment)   Nurse Communication          Time: 8875-8862 OT Time Calculation (min): 13 min  Charges: OT General Charges $OT Visit: 1 Visit OT Treatments $Self Care/Home Management : 8-22 mins  Jovannie Ulibarri OTS   Rhayne Chatwin 09/02/2024, 12:03 PM

## 2024-09-02 NOTE — TOC Initial Note (Signed)
 Transition of Care [facility]) - Initial/Assessment Note    Patient Details  Name: Yolanda Gray MRN: [id number] Date of Birth: August 20, 1955  [facility]) CM/SW Contact:    Nathanael CHRISTELLA Ring, RN Phone Number: 09/02/2024, 5:48 PM  Clinical Narrative:                 Patient has a current recommendation for home health PT and OT and she has a new ostomy.  CM spoke with patient via phone, introduced self and explained role in DC planning.  Her husband is in the room with her.  Discussed home health and she declines, she really wants her husband to take care of her at home, the wound ostomy nurse is coming tomorrow to do teaching.  She declines HH PT and OT as well.  She says her husband has a walker and she can use his if she needs to.  Told her to let us  know if she changes her mind about the home health services, she says that she will.   Expected Discharge Plan: Home/Self Care Barriers to Discharge: Continued Medical Work up   Patient Goals and CMS Choice Patient states their goals for this hospitalization and ongoing recovery are:: Wants to go home and have her husband take care of her, no home health          Expected Discharge Plan and Services   Discharge Planning Services: CM Consult   Living arrangements for the past 2 months: Single Family Home                 DME Arranged: N/A         HH Arranged: Refused HH          Prior Living Arrangements/Services Living arrangements for the past 2 months: Single Family Home Lives with:: Spouse Patient language and need for interpreter reviewed:: Yes Do you feel safe going back to the place where you live?: Yes      Need for Family Participation in Patient Care: Yes (Comment) Care giver support system in place?: Yes (comment) Current home services: DME [facility]) Criminal Activity/Legal Involvement Pertinent to Current Situation/Hospitalization: No - Comment as needed  Activities of Daily Living   ADL Screening  (condition at time of admission) Independently performs ADLs?: Yes (appropriate for developmental age) Is the patient deaf or have difficulty hearing?: No Does the patient have difficulty seeing, even when wearing glasses/contacts?: No Does the patient have difficulty concentrating, remembering, or making decisions?: No  Permission Sought/Granted Permission sought to share information with : Family Supports Permission granted to share information with : Yes, Verbal Permission Granted  Share Information with NAME: Banita Lehn     Permission granted to share info w Relationship: spouse  Permission granted to share info w Contact Information: [phone]  Emotional Assessment   Attitude/Demeanor/Rapport: Engaged Affect (typically observed): Accepting Orientation: : Oriented to Self, Oriented to Place, Oriented to Situation, Oriented to  Time Alcohol / Substance Use: Not Applicable Psych Involvement: No (comment)  Admission diagnosis:  Diverticulitis of intestine with abscess [K57.80] Patient Active Problem List   Diagnosis Date Noted   Diverticulitis of intestine with abscess 08/30/2024   T2DM (type 2 diabetes mellitus) (HCC) 06/22/2024   DKA (diabetic ketoacidosis) (HCC) 06/21/2024   Abscess of sigmoid colon due to diverticulitis 06/21/2024   Septic shock (HCC) 06/21/2024   AKI (acute kidney injury) 06/21/2024   COPD (chronic obstructive pulmonary disease) (HCC) 09/22/2021   Acute respiratory failure with hypoxia (HCC)  09/22/2021   Polycythemia 09/22/2021   Thrombocytopenia 09/22/2021   Tobacco abuse 09/22/2021   Obesity (BMI 30-39.9) 09/22/2021   Does not have health insurance 09/22/2021   PCP:  [name], NP Pharmacy:   [facility] [address] ([address]), [place] - [address] [place]) [place] [address] Phone: [phone] Fax: [phone]  [facility] [address] [place] [address] Phone: [phone] Fax: [phone]     Social Drivers of Health (SDOH) Social History: SDOH Screenings   Food Insecurity: No Food Insecurity (08/30/2024)  Recent Concern: Food Insecurity - Food Insecurity Present (08/16/2024)   Received from [facility]  Housing: Low Risk (08/30/2024)  Recent Concern: Housing - High Risk (08/16/2024)   Received [facility]  Transportation Needs: No Transportation Needs (08/30/2024)  Recent Concern: Transportation Needs - Unmet Transportation Needs (07/13/2024)   Received from [facility]  Utilities: Not At Risk (08/30/2024)  Recent Concern: Utilities - At Risk (08/16/2024)   Received from [facility]  Depression [phone]): Low Risk (08/10/2024)  Financial Resource Strain: High Risk (08/16/2024)   Received from [facility]  Physical Activity: Insufficiently Active (08/16/2024)   Received from [facility]  Social Connections: Moderately Integrated (08/30/2024)  Stress: No Stress Concern Present (08/16/2024)   Received from [facility]  Tobacco Use: Medium Risk (08/30/2024)  Health Literacy: Adequate Health Literacy (08/16/2024)   Received from [facility]   SDOH Interventions:     Readmission Risk Interventions     No data to display

## 2024-09-02 NOTE — Progress Notes (Signed)
 Physical Therapy Treatment Patient Details Name: Yolanda Gray MRN: [id number] DOB: 1956-01-12 Today's Date: 09/02/2024   History of Present Illness 69 y.o. female with complicated diverticulitis s/p open sigmoidectomy with diverting loop ileostomy, appendectomy on 08/30/24. PMH: diabetes, recent episode of DKA, new onset of A-fib RVR, COPD, obesity polycythemia, and history of tobacco abuse.    PT Comments  Patient seen for PT session focused on long ambulation and stair navigation. Patient required supervision/modI for >400' with RW and stair navigation. Tolerated session well with no signs of exertion. Vitals remained stable during activity. Patient shows great potential to make progress with continued acute level rehab. Patient continues to demonstrate mild activity restrictions. Continued skilled PT recommended to progress toward functional goals and support discharge readiness. Pt making good progress toward goals, will continue to follow POC. Discharge recommendation remains appropriate     If plan is discharge home, recommend the following: A little help with walking and/or transfers;A little help with bathing/dressing/bathroom;Help with stairs or ramp for entrance;Assist for transportation   Can travel by private vehicle        Equipment Recommendations  None recommended by PT    Recommendations for Other Services       Precautions / Restrictions Precautions Precautions: Fall Recall of Precautions/Restrictions: Intact Restrictions Weight Bearing Restrictions Per Provider Order: No     Mobility  Bed Mobility Overal bed mobility: Modified Independent                  Transfers Overall transfer level: Modified independent Equipment used: Rolling walker (2 wheels) Transfers: Sit to/from Stand Sit to Stand: Supervision           General transfer comment: cues for hand placement    Ambulation/Gait Ambulation/Gait assistance: Supervision Gait Distance (Feet):  400 Feet Assistive device: Rolling walker (2 wheels) Gait Pattern/deviations: Step-through pattern Gait velocity: decreased     General Gait Details: demonstrated good RW control and managment   Stairs Stairs: Yes Stairs assistance: Modified independent (Device/Increase time) Stair Management: Alternating pattern Number of Stairs: 8     Wheelchair Mobility     Tilt Bed    Modified Rankin (Stroke Patients Only)       Balance Overall balance assessment: Needs assistance Sitting-balance support: Feet supported Sitting balance-Leahy Scale: Good     Standing balance support: During functional activity, No upper extremity supported Standing balance-Leahy Scale: Fair                              Hotel Manager: No apparent difficulties  Cognition Arousal: Alert Behavior During Therapy: WFL for tasks assessed/performed   PT - Cognitive impairments: No apparent impairments                         Following commands: Intact      Cueing Cueing Techniques: Verbal cues  Exercises      General Comments        Pertinent Vitals/Pain Pain Assessment Faces Pain Scale: Hurts even more Pain Location: abdomen Pain Descriptors / Indicators: Discomfort Pain Intervention(s): Monitored during session    Home Living                          Prior Function            PT Goals (current goals can now be found in the care plan section) Acute Rehab  PT Goals Patient Stated Goal: get well and go home PT Goal Formulation: With patient Time For Goal Achievement: 09/21/24 Potential to Achieve Goals: Good Progress towards PT goals: Progressing toward goals    Frequency    Min 1X/week      PT Plan      Co-evaluation              AM-PAC PT 6 Clicks Mobility   Outcome Measure  Help needed turning from your back to your side while in a flat bed without using bedrails?: A Little Help needed moving  from lying on your back to sitting on the side of a flat bed without using bedrails?: A Little Help needed moving to and from a bed to a chair (including a wheelchair)?: A Little Help needed standing up from a chair using your arms (e.g., wheelchair or bedside chair)?: A Little Help needed to walk in hospital room?: A Little Help needed climbing 3-5 steps with a railing? : A Little 6 Click Score: 18    End of Session   Activity Tolerance: Patient tolerated treatment well;Patient limited by fatigue Patient left: Other (comment) Nurse Communication: Mobility status PT Visit Diagnosis: Other abnormalities of gait and mobility (R26.89);Other symptoms and signs involving the nervous system (R29.898)     Time: 8941-8888 PT Time Calculation (min) (ACUTE ONLY): 13 min  Charges:    $Therapeutic Activity: 8-22 mins PT General Charges $$ ACUTE PT VISIT: 1 Visit                     Yolanda Gray DPT, PT     Yolanda A Asuncion Shibata 09/02/2024, 11:28 AM

## 2024-09-02 NOTE — Progress Notes (Signed)
 " Hospitalist consult PROGRESS NOTE    Yolanda Gray  FMW:[id number] DOB: 13-Jul-1956 DOA: 08/30/2024 PCP: [name], NP    Brief Narrative:   69 y.o. female with medical history significant for COPD not on home oxygen, DM, HTN, chronic smoking, paroxysmal A-fib on Eliquis , polycythemia, obesity who was admitted electively on 08/30/2024 for complicated diverticulitis with prior perforation and pericolic abscess.  Patient had a mild AKI with creatinine of 1.26, blood pressure was low in 60s one-time 62/46,.  Hospitalist service was consulted for evaluation for management of medical problems due to hypotension and AKI.  Patient denies any fever, nausea, vomiting, chills, cough, shortness of breath, palpitations, hematuria, dysuria, dizziness, headache.   Patient is postoperative day one of open sigmoidectomy with diverting loop ileostomy and appendectomy for complicated diverticulitis.  Today as mentioned above patient had episode of hypotension and AKI.  Patient was started on IV fluid 75 cc and then upgraded to 100 cc/h.  Patient had a leukocytosis at 13.2, AKI with creatinine of 1.26 baseline is 0.8.   Assessment & Plan:   Principal Problem:   Diverticulitis of intestine with abscess Active Problems:   COPD (chronic obstructive pulmonary disease) (HCC)   Polycythemia   Tobacco abuse   AKI (acute kidney injury)   T2DM (type 2 diabetes mellitus) (HCC)   69 year old female electively admitted on 08/30/2024 for complicated diverticulitis surgery.  She is status post open sigmoidectomy with diverting loop ileostomy and appendectomy for complicated diverticulitis 08/30/24.   1.  Complicated diverticulitis s/p open sigmoidectomy with diverting loop ileostomy - POD #3 - Patient has ostomy - Management per general surgery   2.  Paroxysmal A-fib/hypertension - Patient received 500 cc of normal saline bolus Plan: Fluids discontinued Below remains on hold Continue amiodarone  200 mg  twice daily x 3 additional days followed by 200 mg daily Cardiology following  3.  AKI Mild, nonoliguric Renal function improving Plan: Fluids held.  Encouraged p.o. fluid intake   4.  History of diabetes Advanced to heart diet Sugars acceptable Plan: Monitor CBGs AC and at bedtime Continue sliding scale for now If sugars start to climb consider reintroduction of long-acting insulin  Continue to monitor  5. History of COPD/tobacco abuse/polycythemia No evidence of acute exacerbation Patient on room air Plan: Nebs as needed Encourage incentive spirometry use  Thank you for the consult.   TRH service to continue following at this time.   Please reach out with questions or concerns.  DVT prophylaxis: Arixtra  Code Status: Full Family Communication: Family member at bedside 1/21, 1/22 Disposition Plan: Per primary General Surgical team Level of care: Med-Surg  Consultants:  Hospitalist Cardiology   Procedures:  open sigmoidectomy with diverting loop ileostomy  Antimicrobials: None   Subjective: Seen and examined.  Sitting up on edge of bed.  No distress.  No complaints.  Objective: Vitals:   09/01/24 1425 09/01/24 2100 09/02/24 0351 09/02/24 0722  BP: (!) 96/50 (!) 115/44 (!) 118/47 (!) 124/59  Pulse: 87 91 79 81  Resp: 16 18 18 16   Temp: 97.7 F (36.5 C) 98.1 F (36.7 C) 97.6 F (36.4 C) 98.1 F (36.7 C)  TempSrc:    Oral  SpO2: 95% 95% 96% 95%    Intake/Output Summary (Last 24 hours) at 09/02/2024 1057 Last data filed at 09/02/2024 1010 Gross per 24 hour  Intake 1620 ml  Output 140 ml  Net 1480 ml   There were no vitals filed for this visit.  Examination:  General exam:  Appears fatigued but stable Respiratory system: Clear.  Normal work of breathing.  Room air Cardiovascular system: S1-S2, RRR, no murmurs, no pedal edema Gastrointestinal system: Soft, mild TTP, + ileostomy Central nervous system: Alert and oriented. No focal neurological  deficits. Extremities: Symmetric 5 x 5 power. Skin: No rashes, lesions or ulcers Psychiatry: Judgement and insight appear normal. Mood & affect appropriate.     Data Reviewed: I have personally reviewed following labs and imaging studies  CBC: Recent Labs  Lab 08/27/24 0956 08/31/24 0410 09/01/24 0549 09/02/24 0428  WBC 9.1 13.2* 14.6* 11.3*  HGB 10.7* 9.3* 8.0* 7.7*  HCT 34.5* 31.0* 26.0* 25.3*  MCV 86.0 88.3 88.1 89.1  PLT 305 332 254 254   Basic Metabolic Panel: Recent Labs  Lab 08/30/24 1622 08/31/24 0410 09/01/24 0549 09/02/24 0428  NA  --  136 133* 137  K  --  4.4 4.3 4.5  CL  --  101 102 105  CO2  --  24 21* 24  GLUCOSE  --  124* 128* 146*  BUN  --  16 18 16   CREATININE 0.92 1.26* 1.28* 1.01*  CALCIUM  --  8.4* 8.1* 8.1*   GFR: Estimated Creatinine Clearance: 58 mL/min (A) (by C-G formula based on SCr of 1.01 mg/dL (H)). Liver Function Tests: No results for input(s): AST, ALT, ALKPHOS, BILITOT, PROT, ALBUMIN in the last 168 hours. No results for input(s): LIPASE, AMYLASE in the last 168 hours. No results for input(s): AMMONIA in the last 168 hours. Coagulation Profile: No results for input(s): INR, PROTIME in the last 168 hours. Cardiac Enzymes: No results for input(s): CKTOTAL, CKMB, CKMBINDEX, TROPONINI in the last 168 hours. BNP (last 3 results) No results for input(s): PROBNP in the last 8760 hours. HbA1C: No results for input(s): HGBA1C in the last 72 hours. CBG: Recent Labs  Lab 09/01/24 0734 09/01/24 1148 09/01/24 1655 09/01/24 2106 09/02/24 0723  GLUCAP 132* 202* 169* 182* 140*   Lipid Profile: No results for input(s): CHOL, HDL, LDLCALC, TRIG, CHOLHDL, LDLDIRECT in the last 72 hours. Thyroid Function Tests: No results for input(s): TSH, T4TOTAL, FREET4, T3FREE, THYROIDAB in the last 72 hours. Anemia Panel: No results for input(s): VITAMINB12, FOLATE, FERRITIN, TIBC,  IRON, RETICCTPCT in the last 72 hours. Sepsis Labs: No results for input(s): PROCALCITON, LATICACIDVEN in the last 168 hours.  No results found for this or any previous visit (from the past 240 hours).       Radiology Studies: No results found.      Scheduled Meds:  amiodarone   200 mg Oral BID   Followed by   NOREEN ON 09/04/2024] amiodarone   200 mg Oral Daily   celecoxib   200 mg Oral BID   fondaparinux  (ARIXTRA ) injection  2.5 mg Subcutaneous Q24H   gabapentin   300 mg Oral BID   insulin  aspart  0-15 Units Subcutaneous TID WC   pantoprazole  (PROTONIX ) IV  40 mg Intravenous QHS   Continuous Infusions:   LOS: 3 days     Calvin KATHEE Robson, MD [facility]   If 7PM-7AM, please contact night-coverage  09/02/2024, 10:57 AM   "

## 2024-09-02 NOTE — Progress Notes (Signed)
 " [facility] CLINIC CARDIOLOGY PROGRESS NOTE       Patient ID: Yolanda Gray MRN: [id number] DOB/AGE: [birth year] 69 y.o.  Admit date: 08/30/2024 Referring Physician Gilmer Hong, [place] Primary Physician Gauger, Lauraine Collar, NP  Primary Cardiologist Dr. Dewane Reason for Consultation Hypotension  HPI: Yolanda Gray is a 69 y.o. female  with a past medical history of paroxysmal atrial fibrillation, COPD, tobacco use, polycythemia, obesity who presented for outpatient surgery on 08/30/2024 for complicated diverticulitis with prior perforation and pericolic abscess. Hypotensive this AM. Cardiology was consulted for further evaluation.   Interval history: - Patient seen and examined this morning, resting comfortably in hospital bed with family at bedside. - Feeling well this AM, reports stable abdominal soreness. Denies CP, SOB, lightheadedness or dizziness. - BP stable.   Review of systems complete and found to be negative unless listed above    Past Medical History:  Diagnosis Date   COPD (chronic obstructive pulmonary disease) (HCC)    Diabetes mellitus without complication (HCC)    Diverticulitis of large intestine with perforation without bleeding    Edentulous    Morbid obesity with BMI of 40.0-44.9, adult (HCC)    Obesity (BMI 30-39.9)    Polycythemia    Thrombocytopenia    Tobacco abuse    Tobacco abuse     Past Surgical History:  Procedure Laterality Date   CATARACT EXTRACTION W/PHACO Left 10/09/2023   Procedure: CATARACT EXTRACTION PHACO AND INTRAOCULAR LENS PLACEMENT (IOC) LEFT MALYUGIN OMIDRIA  13.44 01:09.5;  Surgeon: Enola Feliciano Hugger, MD;  Location: [facility];  Service: Ophthalmology;  Laterality: Left;   CATARACT EXTRACTION W/PHACO Right 10/30/2023   Procedure: PHACOEMULSIFICATION, CATARACT, WITH IOL INSERTION 6.65, 00:53.8;  Surgeon: Enola Feliciano Hugger, MD;  Location: [facility];  Service: Ophthalmology;  Laterality: Right;   CESAREAN  SECTION     COLONOSCOPY N/A 08/25/2024   Procedure: COLONOSCOPY;  Surgeon: Tye Millet, DO;  Location: [facility];  Service: General;  Laterality: N/A;   CYSTOSCOPY WITH INDOCYANINE GREEN  IMAGING (ICG) N/A 08/30/2024   Procedure: CYSTOSCOPY WITH INDOCYANINE GREEN  IMAGING (ICG);  Surgeon: Francisca Redell BROCKS, MD;  Location: [facility];  Service: Urology;  Laterality: N/A;   finger laceration     POLYPECTOMY  08/25/2024   Procedure: POLYPECTOMY, INTESTINE;  Surgeon: Tye Millet, DO;  Location: [facility];  Service: General;;   TONSILLECTOMY      Medications Prior to Admission  Medication Sig Dispense Refill Last Dose/Taking   Glucagon  (BAQSIMI  TWO PACK) 3 MG/DOSE POWD Place 3 mg into the nose as needed for up to 2 doses (Severe low blood sugar). Give 3 mg (one actuation) into a single nostril. 2 each 0 Unknown   ibuprofen (ADVIL) 600 MG tablet Take 600 mg by mouth every 6 (six) hours as needed (pain).   Past Week   insulin  glargine (LANTUS ) 100 UNIT/ML Solostar Pen Inject 15 Units into the skin daily. May substitute as needed per insurance. (Patient taking differently: Inject 8 Units into the skin in the morning. May substitute as needed per insurance.) 15 mL 0 08/29/2024   metoprolol  succinate (TOPROL -XL) 25 MG 24 hr tablet Take 25 mg by mouth at bedtime.   Past Week   amiodarone  (PACERONE ) 200 MG tablet Take 1 tablet (200 mg total) by mouth 2 (two) times daily for 9 days, THEN 1 tablet (200 mg total) daily for 14 days. (Patient not taking: Reported on 08/18/2024) 32 tablet 0 Not Taking   apixaban  (ELIQUIS ) 5 MG TABS tablet  Take 1 tablet (5 mg total) by mouth 2 (two) times daily. (Patient not taking: Reported on 08/18/2024) 60 tablet 2 Not Taking   Blood Glucose Monitoring Suppl (BLOOD GLUCOSE MONITOR SYSTEM) w/Device KIT 1 each by Does not apply route as directed. Dispense based on patient and insurance preference. Use up to four times daily as directed. (FOR ICD-10 E10.9, E11.9). 1 kit 0    Glucose  Blood (BLOOD GLUCOSE TEST STRIPS) STRP 1 each by Does not apply route as directed. Dispense based on patient and insurance preference. Use up to four times daily as directed. (FOR ICD-10 E10.9, E11.9). 100 each 0    insulin  aspart (NOVOLOG ) 100 UNIT/ML FlexPen Inject 0-6 Units into the skin 3 (three) times daily with meals. Check Blood Glucose (BG) and inject per scale: BG <150= 0 unit; BG 150-200= 1 unit; BG 201-250= 2 unit; BG 251-300= 3 unit; BG 301-350= 4 unit; BG 351-400= 5 unit; BG >400= 6 unit and Call Primary Care. 15 mL 0 Not Taking   Insulin  Pen Needle 32G X 4 MM MISC 1 each by Does not apply route as directed. Dispense based on patient and insurance preference. Use up to four times daily as directed. (FOR ICD-10 E10.9, E11.9). 100 each 0    Lancet Device MISC 1 each by Does not apply route as directed. Dispense based on patient and insurance preference. Use up to four times daily as directed. (FOR ICD-10 E10.9, E11.9). 1 each 0    Lancets MISC 1 each by Does not apply route as directed. Dispense based on patient and insurance preference. Use up to four times daily as directed. (FOR ICD-10 E10.9, E11.9). 100 each 0    pantoprazole  (PROTONIX ) 40 MG tablet Take 1 tablet (40 mg total) by mouth 2 (two) times daily before a meal for 14 days. (Patient not taking: Reported on 08/18/2024) 28 tablet 0 Not Taking   sodium chloride  flush 0.9 % SOLN injection Use 10 mLs by Intracatheter route daily. 300 mL 0    Social History   Socioeconomic History   Marital status: Married    Spouse name: Deboy,GARLAND   Number of children: Not on file   Years of education: Not on file   Highest education level: Not on file  Occupational History   Not on file  Tobacco Use   Smoking status: Former    Current packs/day: 0.00    Types: Cigarettes    Quit date: 2022    Years since quitting: 4.0   Smokeless tobacco: Never  Vaping Use   Vaping status: Never Used  Substance and Sexual Activity   Alcohol use: Not  Currently   Drug use: Never   Sexual activity: Not on file  Other Topics Concern   Not on file  Social History Narrative   Daughter and 3 grand kids in home   Social Drivers of Health   Tobacco Use: Medium Risk (08/30/2024)   Patient History    Smoking Tobacco Use: Former    Smokeless Tobacco Use: Never    Passive Exposure: Not on file  Financial Resource Strain: High Risk (08/16/2024)   Received from [facility]   Overall Financial Resource Strain (CARDIA)    Difficulty of Paying Living Expenses: Hard  Food Insecurity: No Food Insecurity (08/30/2024)   Epic    Worried About Radiation Protection Practitioner of Food in the Last Year: Never true    Ran Out of Food in the Last Year: Never true  Recent Concern: Food  Insecurity - Food Insecurity Present (08/16/2024)   Received from [facility]   Epic    Within the past 12 months, you worried that your food would run out before you got the money to buy more.: Sometimes true    Within the past 12 months, the food you bought just didn't last and you didn't have money to get more.: Never true  Transportation Needs: No Transportation Needs (08/30/2024)   Epic    Lack of Transportation (Medical): No    Lack of Transportation (Non-Medical): No  Recent Concern: Transportation Needs - Unmet Transportation Needs (07/13/2024)   Received from [facility] - Transportation    In the past 12 months, has lack of transportation kept you from medical appointments or from getting medications?: Yes    Lack of Transportation (Non-Medical): Yes  Physical Activity: Insufficiently Active (08/16/2024)   Received from [facility]   Exercise Vital Sign    On average, how many days per week do you engage in moderate to strenuous exercise (like a brisk walk)?: 7 days    On average, how many minutes do you engage in exercise at this level?: 20 min  Stress: No Stress Concern Present (08/16/2024)   Received from  [facility] - Occupational Stress Questionnaire    Feeling of Stress : Not at all  Social Connections: Moderately Integrated (08/30/2024)   Social Connection and Isolation Panel    Frequency of Communication with Friends and Family: More than three times a week    Frequency of Social Gatherings with Friends and Family: More than three times a week    Attends Religious Services: More than 4 times per year    Active Member of [employer] or Organizations: No    Attends Banker Meetings: Never    Marital Status: Married  Catering Manager Violence: Not At Risk (08/30/2024)   Epic    Fear of Current or Ex-Partner: No    Emotionally Abused: No    Physically Abused: No    Sexually Abused: No  Depression (PHQ2-9): Low Risk (08/10/2024)   Depression (PHQ2-9)    PHQ-2 Score: 0  Alcohol Screen: Not on file  Housing: Low Risk (08/30/2024)   Epic    Unable to Pay for Housing in the Last Year: No    Number of Times Moved in the Last Year: 0    Homeless in the Last Year: No  Recent Concern: Housing - High Risk (08/16/2024)   Received from [facility]   Epic    In the last 12 months, was there a time when you were not able to pay the mortgage or rent on time?: Yes    In the past 12 months, how many times have you moved where you were living?: 0    At any time in the past 12 months, were you homeless or living in a shelter (including now)?: No  Utilities: Not At Risk (08/30/2024)   Epic    Threatened with loss of utilities: No  Recent Concern: Utilities - At Risk (08/16/2024)   Received from [facility]   Epic    In the past 12 months has the electric, gas, oil, or water  company threatened to shut off services in your home?: Yes  Health Literacy: Adequate Health Literacy (08/16/2024)   Received from [facility]   B1300 Health Literacy  How often do you need to have someone help  you when you read instructions, pamphlets, or other written material from your doctor or pharmacy?: Never    Family History  Problem Relation Age of Onset   Asthma Mother    Heart attack Mother    Heart attack Father      Vitals:   09/01/24 1425 09/01/24 2100 09/02/24 0351 09/02/24 0722  BP: (!) 96/50 (!) 115/44 (!) 118/47 (!) 124/59  Pulse: 87 91 79 81  Resp: 16 18 18 16   Temp: 97.7 F (36.5 C) 98.1 F (36.7 C) 97.6 F (36.4 C) 98.1 F (36.7 C)  TempSrc:    Oral  SpO2: 95% 95% 96% 95%    PHYSICAL EXAM General: Well appearing female, well nourished, in no acute distress. HEENT: Normocephalic and atraumatic. Neck: No JVD.  Lungs: Normal respiratory effort on room air. Clear bilaterally to auscultation. No wheezes, crackles, rhonchi.  Heart: HRRR. Normal S1 and S2 without gallops or murmurs.  Abdomen: Non-distended appearing.  Msk: Normal strength and tone for age. Extremities: Warm and well perfused. No clubbing, cyanosis. No edema.  Neuro: Alert and oriented X 3. Psych: Answers questions appropriately.   Labs: Basic Metabolic Panel: Recent Labs    09/01/24 0549 09/02/24 0428  NA 133* 137  K 4.3 4.5  CL 102 105  CO2 21* 24  GLUCOSE 128* 146*  BUN 18 16  CREATININE 1.28* 1.01*  CALCIUM 8.1* 8.1*   Liver Function Tests: No results for input(s): AST, ALT, ALKPHOS, BILITOT, PROT, ALBUMIN in the last 72 hours. No results for input(s): LIPASE, AMYLASE in the last 72 hours. CBC: Recent Labs    09/01/24 0549 09/02/24 0428  WBC 14.6* 11.3*  HGB 8.0* 7.7*  HCT 26.0* 25.3*  MCV 88.1 89.1  PLT 254 254   Cardiac Enzymes: No results for input(s): CKTOTAL, CKMB, CKMBINDEX, TROPONINIHS in the last 72 hours. BNP: No results for input(s): BNP in the last 72 hours. D-Dimer: No results for input(s): DDIMER in the last 72 hours. Hemoglobin A1C: No results for input(s): HGBA1C in the last 72 hours. Fasting Lipid Panel: No results for  input(s): CHOL, HDL, LDLCALC, TRIG, CHOLHDL, LDLDIRECT in the last 72 hours. Thyroid Function Tests: No results for input(s): TSH, T4TOTAL, T3FREE, THYROIDAB in the last 72 hours.  Invalid input(s): FREET3 Anemia Panel: No results for input(s): VITAMINB12, FOLATE, FERRITIN, TIBC, IRON, RETICCTPCT in the last 72 hours.   Radiology: No results found.  ECHO 06/2024: 1. Left ventricular ejection fraction, by estimation, is 60 to 65%. The left ventricle has normal function. The left ventricle has no regional wall motion abnormalities. Left ventricular diastolic parameters are indeterminate.   2. Right ventricular systolic function is normal. The right ventricular  size is normal. There is normal pulmonary artery systolic pressure. The  estimated right ventricular systolic pressure is 30.0 mmHg.   3. The mitral valve is normal in structure. No evidence of mitral valve  regurgitation. No evidence of mitral stenosis.   4. The aortic valve is normal in structure. Aortic valve regurgitation is  not visualized. No aortic stenosis is present.   5. The inferior vena cava is normal in size with greater than 50%  respiratory variability, suggesting right atrial pressure of 3 mmHg.   TELEMETRY (personally reviewed): not on tele  EKG 08/27/24 (personally reviewed): NSR rate 89 bpm  Data reviewed by me 09/02/2024: last 24h vitals tele labs imaging I/O ED provider note, admission H&P, general surgery notes  Principal Problem:   Diverticulitis of intestine with abscess Active Problems:   COPD (chronic obstructive pulmonary disease) (HCC)   Polycythemia   Tobacco abuse   AKI (acute kidney injury)   T2DM (type 2 diabetes mellitus) (HCC)    ASSESSMENT AND PLAN:  Yolanda Gray is a 69 y.o. female  with a past medical history of paroxysmal atrial fibrillation, COPD, tobacco use, polycythemia, obesity who presented for outpatient surgery on 08/30/2024 for complicated  diverticulitis with prior perforation and pericolic abscess. Hypotensive this AM. Cardiology was consulted for further evaluation.   # Hypotension # POD #1 open sigmoidectomy with loop ileostomy/appendectomy # Paroxysmal atrial fibrillation Patient had surgery yesterday and was recovering well until developing hypotension on morning of 08/31/24. Given 500 cc bolus and started on continuous IVF. She is asymptomatic.  - S/p IV fluids with improvement with BP. -Hold metoprolol . Consider resuming prior to DC pending BP.  -Continue amiodarone  200 mg twice daily for 4 more days followed by 200 mg daily.  -Continue holding Eliquis  until OK to resume per general surgery. Awaiting their recommendations on when it will be ok to resume.   This patient's plan of care was discussed and created with Dr. Florencio and he is in agreement.  Signed: Danita Bloch, PA-C  09/02/2024, 7:50 AM [facility] Cardiology      "

## 2024-09-03 ENCOUNTER — Other Ambulatory Visit: Payer: Self-pay

## 2024-09-03 DIAGNOSIS — K5780 Diverticulitis of intestine, part unspecified, with perforation and abscess without bleeding: Secondary | ICD-10-CM | POA: Diagnosis not present

## 2024-09-03 LAB — BASIC METABOLIC PANEL WITH GFR
Anion gap: 9 (ref 5–15)
BUN: 12 mg/dL (ref 8–23)
CO2: 25 mmol/L (ref 22–32)
Calcium: 8.4 mg/dL — ABNORMAL LOW (ref 8.9–10.3)
Chloride: 105 mmol/L (ref 98–111)
Creatinine, Ser: 0.82 mg/dL (ref 0.44–1.00)
GFR, Estimated: >60 mL/min
Glucose, Bld: 130 mg/dL — ABNORMAL HIGH (ref 70–99)
Potassium: 4.5 mmol/L (ref 3.5–5.1)
Sodium: 139 mmol/L (ref 135–145)

## 2024-09-03 LAB — CBC
HCT: 25.9 % — ABNORMAL LOW (ref 36.0–46.0)
Hemoglobin: 7.7 g/dL — ABNORMAL LOW (ref 12.0–15.0)
MCH: 26.2 pg (ref 26.0–34.0)
MCHC: 29.7 g/dL — ABNORMAL LOW (ref 30.0–36.0)
MCV: 88.1 fL (ref 80.0–100.0)
Platelets: 263 K/uL (ref 150–400)
RBC: 2.94 MIL/uL — ABNORMAL LOW (ref 3.87–5.11)
RDW: 15.2 % (ref 11.5–15.5)
WBC: 8 K/uL (ref 4.0–10.5)
nRBC: 0 % (ref 0.0–0.2)

## 2024-09-03 LAB — GLUCOSE, CAPILLARY
Glucose-Capillary: 114 mg/dL — ABNORMAL HIGH (ref 70–99)
Glucose-Capillary: 174 mg/dL — ABNORMAL HIGH (ref 70–99)

## 2024-09-03 MED ORDER — METOPROLOL SUCCINATE ER 25 MG PO TB24
25.0000 mg | ORAL_TABLET | Freq: Every day | ORAL | Status: DC
  Administered 2024-09-03: 25 mg via ORAL
  Filled 2024-09-03: qty 1

## 2024-09-03 MED ORDER — HYDROCODONE-ACETAMINOPHEN 5-325 MG PO TABS
1.0000 | ORAL_TABLET | Freq: Three times a day (TID) | ORAL | 0 refills | Status: AC | PRN
  Filled 2024-09-03: qty 10, 4d supply, fill #0

## 2024-09-03 MED ORDER — APIXABAN 5 MG PO TABS
5.0000 mg | ORAL_TABLET | Freq: Two times a day (BID) | ORAL | Status: DC
  Administered 2024-09-03: 5 mg via ORAL
  Filled 2024-09-03: qty 1

## 2024-09-03 NOTE — Progress Notes (Signed)
 [facility]- General Surgery  SURGICAL PROGRESS NOTE  Hospital Day(s): 4.   Post op day(s): 4 Days Post-Op.   Interval History:  Reports having right flank pain but otherwise doing well. Admits abdominal pain is worse with movement. Has been tolerating solid food. She had one ensure yesterday. She is now having more pasty stool in ileostomy bag. Denies any nausea or vomiting. Output in drain has been decreasing significantly.   Vital signs in last 24 hours: [min-max] current  Temp:  [97.4 F (36.3 C)-97.9 F (36.6 C)] 97.7 F (36.5 C) (01/23 0342) Pulse Rate:  [77-85] 78 (01/23 0342) Resp:  [18] 18 (01/23 0342) BP: (111-171)/(46-71) 171/71 (01/23 0342) SpO2:  [97 %-98 %] 98 % (01/23 0342) Weight:  [95 kg] 95 kg (01/23 0500)       Weight: 95 kg BMI (Calculated): 37.11   Intake/Output last 2 shifts:  01/22 0701 - 01/23 0700 In: 1440 [P.O.:1440] Out: 305 [Drains:5; Stool:300]   Physical Exam:  Constitutional: alert, cooperative and no distress  Respiratory: breathing non-labored at rest  Cardiovascular: regular rate and sinus rhythm  Gastrointestinal: soft, non-tender, and non-distended, pasty stool in ileostomy bag and minimal serosanguineous output in drain, midline incision and laparoscopic incisions are clean and dry with staples intact  Labs:     Latest Ref Rng & Units 09/03/2024    5:37 AM 09/02/2024    4:28 AM 09/01/2024    5:49 AM  CBC  WBC 4.0 - 10.5 K/uL 8.0  11.3  14.6   Hemoglobin 12.0 - 15.0 g/dL 7.7  7.7  8.0   Hematocrit 36.0 - 46.0 % 25.9  25.3  26.0   Platelets 150 - 400 K/uL 263  254  254       Latest Ref Rng & Units 09/03/2024    5:37 AM 09/02/2024    4:28 AM 09/01/2024    5:49 AM  CMP  Glucose 70 - 99 mg/dL 869  853  871   BUN 8 - 23 mg/dL 12  16  18    Creatinine 0.44 - 1.00 mg/dL 9.17  8.98  8.71   Sodium 135 - 145 mmol/L 139  137  133   Potassium 3.5 - 5.1 mmol/L 4.5  4.5  4.3   Chloride 98 - 111 mmol/L 105  105  102   CO2 22 - 32 mmol/L 25  24   21    Calcium 8.9 - 10.3 mg/dL 8.4  8.1  8.1     Imaging studies: No new pertinent imaging studies   Assessment/Plan:  69 y.o. female with complicated diverticulitis 4 Days Post-Op s/p open sigmoidectomy with diverting loop ileostomy, appendectomy, complicated by pertinent comorbidities including diabetes, recent episode of DKA, new onset of A-fib RVR, COPD, obesity polycythemia, and history of tobacco abuse.    - Recovering well, no fever not tachycardic  - Leukocytosis resolved   - Hemoglobin levels no longer trending down, still no signs of active bleeding expect levels to trend back up.  Continue to monitor  - Tolerating regular diet, denies any nausea or vomiting.  Continue carb modified diet  - Expecting wound care nurse today for ostomy teaching and care  - No contraindication for discharge from our standpoint after ostomy nurse sees patient. She is tolerating regular diet and having good stool output in ileostomy bag. Abdominal discomfort seems to be overall controlled with pain medication.  However patient is requesting to stay due to possible snowstorm this weekend. She is afraid she won't be  able to come back to the hospital in case she feels the need to come back.   A-fib             - Per cardiology team, resuming metoprolol  today             - Continue amiodarone  200 mg twice daily for 1 more day ollowed by 200 mg daily             - Ok to resume Eliquis , will continue to monitor hemoglobin levels  -- [employer] PA-C

## 2024-09-03 NOTE — Progress Notes (Cosign Needed Addendum)
 " [facility] CLINIC CARDIOLOGY PROGRESS NOTE       Patient ID: Yolanda Gray MRN: [id number] DOB/AGE: Sep 22, 1955 69 y.o.  Admit date: 08/30/2024 Referring Physician Yolanda Gray, [place] Primary Physician Gauger, Yolanda Collar, NP  Primary Cardiologist Dr. Dewane Reason for Consultation Hypotension  HPI: Yolanda Gray is a 69 y.o. female  with a past medical history of paroxysmal atrial fibrillation, COPD, tobacco use, polycythemia, obesity who presented for outpatient surgery on 08/30/2024 for complicated diverticulitis with prior perforation and pericolic abscess. Hypotensive this AM. Cardiology was consulted for further evaluation.   Interval history: - Patient seen and examined this morning, resting comfortably in hospital bed with family at bedside. - Feeling well this AM, feels her abdominal discomfort is improving, only has issues when she is moving around now.  Denies CP, SOB, lightheadedness or dizziness. - BP stable.   Review of systems complete and found to be negative unless listed above    Past Medical History:  Diagnosis Date   COPD (chronic obstructive pulmonary disease) (HCC)    Diabetes mellitus without complication (HCC)    Diverticulitis of large intestine with perforation without bleeding    Edentulous    Morbid obesity with BMI of 40.0-44.9, adult (HCC)    Obesity (BMI 30-39.9)    Polycythemia    Thrombocytopenia    Tobacco abuse    Tobacco abuse     Past Surgical History:  Procedure Laterality Date   CATARACT EXTRACTION W/PHACO Left 10/09/2023   Procedure: CATARACT EXTRACTION PHACO AND INTRAOCULAR LENS PLACEMENT (IOC) LEFT MALYUGIN OMIDRIA  13.44 01:09.5;  Surgeon: Yolanda Feliciano Hugger, MD;  Location: [facility];  Service: Ophthalmology;  Laterality: Left;   CATARACT EXTRACTION W/PHACO Right 10/30/2023   Procedure: PHACOEMULSIFICATION, CATARACT, WITH IOL INSERTION 6.65, 00:53.8;  Surgeon: Yolanda Feliciano Hugger, MD;  Location: [facility];   Service: Ophthalmology;  Laterality: Right;   CESAREAN SECTION     COLONOSCOPY N/A 08/25/2024   Procedure: COLONOSCOPY;  Surgeon: Yolanda Millet, DO;  Location: [facility];  Service: General;  Laterality: N/A;   CYSTOSCOPY WITH INDOCYANINE GREEN  IMAGING (ICG) N/A 08/30/2024   Procedure: CYSTOSCOPY WITH INDOCYANINE GREEN  IMAGING (ICG);  Surgeon: Yolanda Redell BROCKS, MD;  Location: [facility] ORS;  Service: Urology;  Laterality: N/A;   finger laceration     POLYPECTOMY  08/25/2024   Procedure: POLYPECTOMY, INTESTINE;  Surgeon: Yolanda Millet, DO;  Location: [facility] END[facility];  Service: General;;   TONSILLECTOMY      Medications Prior to Admission  Medication Sig Dispense Refill Last Dose/Taking   Glucagon  (BAQSIMI  TWO PACK) 3 MG/DOSE POWD Place 3 mg into the nose as needed for up to 2 doses (Severe low blood sugar). Give 3 mg (one actuation) into a single nostril. 2 each 0 Unknown   ibuprofen (ADVIL) 600 MG tablet Take 600 mg by mouth every 6 (six) hours as needed (pain).   Past Week   insulin  glargine (LANTUS ) 100 UNIT/ML Solostar Pen Inject 15 Units into the skin daily. May substitute as needed per insurance. (Patient taking differently: Inject 8 Units into the skin in the morning. May substitute as needed per insurance.) 15 mL 0 08/29/2024   metoprolol  succinate (TOPROL -XL) 25 MG 24 hr tablet Take 25 mg by mouth at bedtime.   Past Week   amiodarone  (PACERONE ) 200 MG tablet Take 1 tablet (200 mg total) by mouth 2 (two) times daily for 9 days, THEN 1 tablet (200 mg total) daily for 14 days. (Patient not taking: Reported on 08/18/2024) 32  tablet 0 Not Taking   apixaban  (ELIQUIS ) 5 MG TABS tablet Take 1 tablet (5 mg total) by mouth 2 (two) times daily. (Patient not taking: Reported on 08/18/2024) 60 tablet 2 Not Taking   Blood Glucose Monitoring Suppl (BLOOD GLUCOSE MONITOR SYSTEM) w/Device KIT 1 each by Does not apply route as directed. Dispense based on patient and insurance preference. Use up to four times daily as  directed. (FOR ICD-10 E10.9, E11.9). 1 kit 0    Glucose Blood (BLOOD GLUCOSE TEST STRIPS) STRP 1 each by Does not apply route as directed. Dispense based on patient and insurance preference. Use up to four times daily as directed. (FOR ICD-10 E10.9, E11.9). 100 each 0    insulin  aspart (NOVOLOG ) 100 UNIT/ML FlexPen Inject 0-6 Units into the skin 3 (three) times daily with meals. Check Blood Glucose (BG) and inject per scale: BG <150= 0 unit; BG 150-200= 1 unit; BG 201-250= 2 unit; BG 251-300= 3 unit; BG 301-350= 4 unit; BG 351-400= 5 unit; BG >400= 6 unit and Call Primary Care. 15 mL 0 Not Taking   Insulin  Pen Needle 32G X 4 MM MISC 1 each by Does not apply route as directed. Dispense based on patient and insurance preference. Use up to four times daily as directed. (FOR ICD-10 E10.9, E11.9). 100 each 0    Lancet Device MISC 1 each by Does not apply route as directed. Dispense based on patient and insurance preference. Use up to four times daily as directed. (FOR ICD-10 E10.9, E11.9). 1 each 0    Lancets MISC 1 each by Does not apply route as directed. Dispense based on patient and insurance preference. Use up to four times daily as directed. (FOR ICD-10 E10.9, E11.9). 100 each 0    pantoprazole  (PROTONIX ) 40 MG tablet Take 1 tablet (40 mg total) by mouth 2 (two) times daily before a meal for 14 days. (Patient not taking: Reported on 08/18/2024) 28 tablet 0 Not Taking   sodium chloride  flush 0.9 % SOLN injection Use 10 mLs by Intracatheter route daily. 300 mL 0    Social History   Socioeconomic History   Marital status: Married    Spouse name: YolandaGARLAND   Number of children: Not on file   Years of education: Not on file   Highest education level: Not on file  Occupational History   Not on file  Tobacco Use   Smoking status: Former    Current packs/day: 0.00    Types: Cigarettes    Quit date: 2022    Years since quitting: 4.0   Smokeless tobacco: Never  Vaping Use   Vaping status: Never  Used  Substance and Sexual Activity   Alcohol use: Not Currently   Drug use: Never   Sexual activity: Not on file  Other Topics Concern   Not on file  Social History Narrative   Daughter and 3 grand kids in home   Social Drivers of Health   Tobacco Use: Medium Risk (08/30/2024)   Patient History    Smoking Tobacco Use: Former    Smokeless Tobacco Use: Never    Passive Exposure: Not on file  Financial Resource Strain: High Risk (08/16/2024)   Received from [facility]   Overall Financial Resource Strain (CARDIA)    Difficulty of Paying Living Expenses: Hard  Food Insecurity: No Food Insecurity (08/30/2024)   Epic    Worried About Running Out of Food in the Last Year: Never true    Ran Out  of Food in the Last Year: Never true  Recent Concern: Food Insecurity - Food Insecurity Present (08/16/2024)   Received from [facility]   Epic    Within the past 12 months, you worried that your food would run out before you got the money to buy more.: Sometimes true    Within the past 12 months, the food you bought just didn't last and you didn't have money to get more.: Never true  Transportation Needs: No Transportation Needs (08/30/2024)   Epic    Lack of Transportation (Medical): No    Lack of Transportation (Non-Medical): No  Recent Concern: Transportation Needs - Unmet Transportation Needs (07/13/2024)   Received from [facility] - Transportation    In the past 12 months, has lack of transportation kept you from medical appointments or from getting medications?: Yes    Lack of Transportation (Non-Medical): Yes  Physical Activity: Insufficiently Active (08/16/2024)   Received from [facility]   Exercise Vital Sign    On average, how many days per week do you engage in moderate to strenuous exercise (like a brisk walk)?: 7 days    On average, how many minutes do you engage in exercise at this level?: 20 min   Stress: No Stress Concern Present (08/16/2024)   Received from [facility] - Occupational Stress Questionnaire    Feeling of Stress : Not at all  Social Connections: Moderately Integrated (08/30/2024)   Social Connection and Isolation Panel    Frequency of Communication with Friends and Family: More than three times a week    Frequency of Social Gatherings with Friends and Family: More than three times a week    Attends Religious Services: More than 4 times per year    Active Member of [employer] or Organizations: No    Attends Banker Meetings: Never    Marital Status: Married  Catering Manager Violence: Not At Risk (08/30/2024)   Epic    Fear of Current or Ex-Partner: No    Emotionally Abused: No    Physically Abused: No    Sexually Abused: No  Depression (PHQ2-9): Low Risk (08/10/2024)   Depression (PHQ2-9)    PHQ-2 Score: 0  Alcohol Screen: Not on file  Housing: Low Risk (08/30/2024)   Epic    Unable to Pay for Housing in the Last Year: No    Number of Times Moved in the Last Year: 0    Homeless in the Last Year: No  Recent Concern: Housing - High Risk (08/16/2024)   [facility]   Epic    In the last 12 months, was there a time when you were not able to pay the mortgage or rent on time?: Yes    In the past 12 months, how many times have you moved where you were living?: 0    At any time in the past 12 months, were you homeless or living in a shelter (including now)?: No  Utilities: Not At Risk (08/30/2024)   Epic    Threatened with loss of utilities: No  Recent Concern: Utilities - At Risk (08/16/2024)   Received from [facility]   Epic    In the past 12 months has the electric, gas, oil, or water  company threatened to shut off services in your home?: Yes  Health Literacy: Adequate Health Literacy (08/16/2024)   Received  from [facility]   [phone]  Health Literacy    How often do you need to have someone help you when you read instructions, pamphlets, or other written material from your doctor or pharmacy?: Never    Family History  Problem Relation Age of Onset   Asthma Mother    Heart attack Mother    Heart attack Father      Vitals:   09/02/24 1955 09/03/24 0342 09/03/24 0500 09/03/24 0735  BP: (!) 147/56 (!) 171/71  (!) 155/66  Pulse: 85 78  75  Resp: 18 18  17   Temp: (!) 97.4 F (36.3 C) 97.7 F (36.5 C)  97.7 F (36.5 C)  TempSrc:      SpO2: 97% 98%  98%  Weight:   95 kg     PHYSICAL EXAM General: Well appearing female, well nourished, in no acute distress. HEENT: Normocephalic and atraumatic. Neck: No JVD.  Lungs: Normal respiratory effort on room air. Clear bilaterally to auscultation. No wheezes, crackles, rhonchi.  Heart: HRRR. Normal S1 and S2 without gallops or murmurs.  Abdomen: Non-distended appearing.  Msk: Normal strength and tone for age. Extremities: Warm and well perfused. No clubbing, cyanosis. No edema.  Neuro: Alert and oriented X 3. Psych: Answers questions appropriately.   Labs: Basic Metabolic Panel: Recent Labs    09/02/24 0428 09/03/24 0537  NA 137 139  K 4.5 4.5  CL 105 105  CO2 24 25  GLUCOSE 146* 130*  BUN 16 12  CREATININE 1.01* 0.82  CALCIUM 8.1* 8.4*   Liver Function Tests: No results for input(s): AST, ALT, ALKPHOS, BILITOT, PROT, ALBUMIN in the last 72 hours. No results for input(s): LIPASE, AMYLASE in the last 72 hours. CBC: Recent Labs    09/02/24 0428 09/03/24 0537  WBC 11.3* 8.0  HGB 7.7* 7.7*  HCT 25.3* 25.9*  MCV 89.1 88.1  PLT 254 263   Cardiac Enzymes: No results for input(s): CKTOTAL, CKMB, CKMBINDEX, TROPONINIHS in the last 72 hours. BNP: No results for input(s): BNP in the last 72 hours. D-Dimer: No results for input(s): DDIMER in the last 72 hours. Hemoglobin A1C: No results for input(s): HGBA1C in the last 72  hours. Fasting Lipid Panel: No results for input(s): CHOL, HDL, LDLCALC, TRIG, CHOLHDL, LDLDIRECT in the last 72 hours. Thyroid Function Tests: No results for input(s): TSH, T4TOTAL, T3FREE, THYROIDAB in the last 72 hours.  Invalid input(s): FREET3 Anemia Panel: No results for input(s): VITAMINB12, FOLATE, FERRITIN, TIBC, IRON, RETICCTPCT in the last 72 hours.   Radiology: No results found.  ECHO 06/2024: 1. Left ventricular ejection fraction, by estimation, is 60 to 65%. The left ventricle has normal function. The left ventricle has no regional wall motion abnormalities. Left ventricular diastolic parameters are indeterminate.   2. Right ventricular systolic function is normal. The right ventricular  size is normal. There is normal pulmonary artery systolic pressure. The  estimated right ventricular systolic pressure is 30.0 mmHg.   3. The mitral valve is normal in structure. No evidence of mitral valve  regurgitation. No evidence of mitral stenosis.   4. The aortic valve is normal in structure. Aortic valve regurgitation is  not visualized. No aortic stenosis is present.   5. The inferior vena cava is normal in size with greater than 50%  respiratory variability, suggesting right atrial pressure of 3 mmHg.   TELEMETRY (personally reviewed): not on tele  EKG 08/27/24 (personally reviewed): NSR rate 89 bpm  Data reviewed by me  09/03/2024: last 24h vitals tele labs imaging I/O ED provider note, admission H&P, general surgery notes  Principal Problem:   Diverticulitis of intestine with abscess Active Problems:   COPD (chronic obstructive pulmonary disease) (HCC)   Polycythemia   Tobacco abuse   AKI (acute kidney injury)   T2DM (type 2 diabetes mellitus) (HCC)    ASSESSMENT AND PLAN:  Yolanda Gray is a 69 y.o. female  with a past medical history of paroxysmal atrial fibrillation, COPD, tobacco use, polycythemia, obesity who presented for  outpatient surgery on 08/30/2024 for complicated diverticulitis with prior perforation and pericolic abscess. Hypotensive this AM. Cardiology was consulted for further evaluation.   # Hypotension # POD #1 open sigmoidectomy with loop ileostomy/appendectomy # Paroxysmal atrial fibrillation Patient had surgery yesterday and was recovering well until developing hypotension on morning of 08/31/24. Given 500 cc bolus and started on continuous IVF. She is asymptomatic.  -S/p IV fluids with improvement with BP. -Resume metoprolol  succinate 25 mg daily. -Continue amiodarone  200 mg twice daily for 1 more days followed by 200 mg daily.  -Per discussion with general surgery today, ok to resume Eliquis  as Hgb stable and no evidence of bleeding. Will continue to monitor blood counts while admitted.    This patient's plan of care was discussed and created with Dr. Florencio and he is in agreement.  Signed: Danita Bloch, PA-C  09/03/2024, 8:08 AM [facility]      "

## 2024-09-03 NOTE — Discharge Summary (Addendum)
 [facility]-General Surgery  SURGICAL DISCHARGE SUMMARY  Patient ID: Yolanda Gray MRN: [id number] DOB/AGE: Jul 25, 1956 69 y.o.  Admit date: 08/30/2024 Discharge date: 09/03/2024  Discharge Diagnoses Patient Active Problem List   Diagnosis Date Noted   Diverticulitis of intestine with abscess 08/30/2024   T2DM (type 2 diabetes mellitus) (HCC) 06/22/2024   DKA (diabetic ketoacidosis) (HCC) 06/21/2024   Abscess of sigmoid colon due to diverticulitis 06/21/2024   Septic shock (HCC) 06/21/2024   AKI (acute kidney injury) 06/21/2024   COPD (chronic obstructive pulmonary disease) (HCC) 09/22/2021   Acute respiratory failure with hypoxia (HCC) 09/22/2021   Polycythemia 09/22/2021   Thrombocytopenia 09/22/2021   Tobacco abuse 09/22/2021   Obesity (BMI 30-39.9) 09/22/2021   Does not have health insurance 09/22/2021    Consultants Cardiologist  Hospitalist   Procedures Attempted Robotic assisted laparoscopic converted to open sigmoid colectomy. Appendectomy   Hospital Course:  Patient was scheduled for a robotic assisted laparoscopic sigmoidectomy for history of complicated diverticulitis. Patient was taken to the operating room on 08/30/2024. Surgery was attempted robotic assisted laparoscopic but was converted to open due to persistent active phlegmon and adhesions. Appendix was adhered to sigmoid colon.  Appendectomy was indicated.  Although primary anastomosis was performed, given the complexity of the case, a loop diverting loop ileostomy was done.  Patient tolerated procedure.   08/31/24 post-op Day 1: Hypotensive, no fever not tachycardic. Mildly elevated creatinine levels. No signs of active bleeding. IV fluids were ordered. Cardiologist and hospitalist were consulted. Hospitalist recommended 500 cc of normal saline bolus. Cardiologist recommended to hold metoprolol  due to soft BPs. Patient had gas in ileostomy. She was advanced to full liquid diet. Serosanguineous output in  drain. PT/OT eval was added. Patient was also seen by wound care nurse.  09/01/24 post op Day 2: Systolic blood pressure was improving. Stool noted in ileostomy bag.  She was advanced to heart healthy diet.  IV fluids were discontinued.  Still serosanguineous output in JP drain.  Honeycomb dressing on midline incision was removed.  Midline incision is clean dry with skin staples intact.  09/02/24 post-op Day 3: Blood pressures continue to improve. Patient started having pasty stools in ileostomy bag.  Leukocytosis level improving.  Tolerating solid food, denied any nausea or vomiting. Changed to modified carb diet per dietician recommendation. Serosanguineous output in drain decreasing.  09/03/24 post-op Day 4: Stable vital signs, patient resumed her Metoprolol  and Eliquis .  Wound care nurse provided further education on ileostomy care with husband at bedside.  Patient is still tolerating solid diet and ambulating well.  Surgical incisions are clean and dry, no signs of infection with all skin staples intact.  Abdominal pain overall controlled with pain medication.  Serosanguineous output in drain remains minimal. Patient is clear from surgical standpoint.  Patient will follow-up outpatient with Dr. Cesar in 2 weeks to remove skin staples.   Physical Examination:  Constitutional: alert, in no acute distress Pulmonary: CTA bilaterally, normal breath sounds Cardiac: regular rate and rhythm Gastrointestinal: soft, mild generalized tenderness, and non-distended, pasty stool in ileostomy bag with support belt.  Surgical incisions are clean and dry with skin staples intact.  Removed JP drain with no issue. Covered drain site with gauze and paper tape   Allergies as of 09/03/2024       Reactions   Pork Allergy Hives, Swelling   Porcine (pork) Protein-containing Drug Products Hives        Medication List     TAKE these medications  Accu-Chek Guide Test test strip Generic drug: glucose  blood 1 each by Does not apply route as directed. Dispense based on patient and insurance preference. Use up to four times daily as directed. (FOR ICD-10 E10.9, E11.9).   Accu-Chek Guide w/Device Kit 1 each by Does not apply route as directed. Dispense based on patient and insurance preference. Use up to four times daily as directed. (FOR ICD-10 E10.9, E11.9).   Accu-Chek Softclix Lancets lancets 1 each by Does not apply route as directed. Dispense based on patient and insurance preference. Use up to four times daily as directed. (FOR ICD-10 E10.9, E11.9).   amiodarone  200 MG tablet Commonly known as: PACERONE  Take 1 tablet (200 mg total) by mouth 2 (two) times daily for 9 days, THEN 1 tablet (200 mg total) daily for 14 days. Start taking on: June 26, 2024   Baqsimi  Two Pack 3 MG/DOSE Powd Generic drug: Glucagon  Place 3 mg into the nose as needed for up to 2 doses (Severe low blood sugar). Give 3 mg (one actuation) into a single nostril.   Eliquis  5 MG Tabs tablet Generic drug: apixaban  Take 1 tablet (5 mg total) by mouth 2 (two) times daily.   Embecta Pen Needle Nano 2 Gen 32G X 4 MM Misc Generic drug: Insulin  Pen Needle 1 each by Does not apply route as directed. Dispense based on patient and insurance preference. Use up to four times daily as directed. (FOR ICD-10 E10.9, E11.9).   HYDROcodone -acetaminophen  5-325 MG tablet Commonly known as: NORCO/VICODIN Take 1 tablet by mouth every 8 (eight) hours as needed for up to 3 days.   ibuprofen 600 MG tablet Commonly known as: ADVIL Take 600 mg by mouth every 6 (six) hours as needed (pain).   Lancet Device Misc 1 each by Does not apply route as directed. Dispense based on patient and insurance preference. Use up to four times daily as directed. (FOR ICD-10 E10.9, E11.9).   Lantus  SoloStar 100 UNIT/ML Solostar Pen Generic drug: insulin  glargine Inject 15 Units into the skin daily. May substitute as needed per insurance. What  changed:  how much to take when to take this   metoprolol  succinate 25 MG 24 hr tablet Commonly known as: TOPROL -XL Take 25 mg by mouth at bedtime.   NovoLOG  FlexPen 100 UNIT/ML FlexPen Generic drug: insulin  aspart Inject 0-6 Units into the skin 3 (three) times daily with meals. Check Blood Glucose (BG) and inject per scale: BG <150= 0 unit; BG 150-200= 1 unit; BG 201-250= 2 unit; BG 251-300= 3 unit; BG 301-350= 4 unit; BG 351-400= 5 unit; BG >400= 6 unit and Call Primary Care.   pantoprazole  40 MG tablet Commonly known as: PROTONIX  Take 1 tablet (40 mg total) by mouth 2 (two) times daily before a meal for 14 days.   sodium chloride  flush 0.9 % Soln injection Use 10 mLs by Intracatheter route daily.          Follow-up Information     Custovic, Annalee, DO. Go in 2 week(s).   Specialty: Cardiology Contact information: [address] [place] [address] [phone]         Rodolph Romano, MD Follow up in 2 week(s).   Specialty: General Surgery Why: 2 weeks s/p open sigmoidectomy with ileostomy Contact information: [address] [place] [address] [phone]                  Time spent on discharge management including discussion of hospital course, clinical  condition, outpatient instructions, prescriptions, and follow up with the patient and members of the medical team: >30 minutes  Kamonte Mcmichen Barrientos PA-C

## 2024-09-03 NOTE — Progress Notes (Signed)
 Physical Therapy Treatment Patient Details Name: Yolanda Gray MRN: [id number] DOB: [birth year] Today's Date: 09/03/2024   History of Present Illness 69 y.o. female with complicated diverticulitis s/p open sigmoidectomy with diverting loop ileostomy, appendectomy on 08/30/24. PMH: diabetes, recent episode of DKA, new onset of A-fib RVR, COPD, obesity polycythemia, and history of tobacco abuse.    PT Comments  Pt was pleasant and motivated to participate during the session and put forth good effort throughout. Pt reported history of abdominal pain associated with going from sup to/from sit.  Log roll sequencing education and practice provided.  Pt steady with all standing activities with a RW as well as during limited amb in the room without an AD.  Pt reported no adverse symptoms during the session with SpO2 and HR WNL throughout on room air. Pt will benefit from continued PT services upon discharge to safely address deficits listed in patient problem list for decreased caregiver assistance and eventual return to PLOF.       If plan is discharge home, recommend the following: A little help with walking and/or transfers;A little help with bathing/dressing/bathroom;Help with stairs or ramp for entrance;Assist for transportation   Can travel by private vehicle        Equipment Recommendations  None recommended by PT    Recommendations for Other Services       Precautions / Restrictions Precautions Precautions: Fall Restrictions Weight Bearing Restrictions Per Provider Order: No Other Position/Activity Restrictions: LLE JP drain, RLQ ileostomy     Mobility  Bed Mobility Overal bed mobility: Needs Assistance Bed Mobility: Rolling, Sidelying to Sit, Sit to Sidelying Rolling: Used rails, Supervision Sidelying to sit: Min assist     Sit to sidelying: Min assist General bed mobility comments: Log roll training for decreased abdominal pain with bed mobility tasks with min to mod verbal  cues for proper sequencing and min A for BLE control    Transfers Overall transfer level: Modified independent Equipment used: Rolling walker (2 wheels)               General transfer comment: Good eccentric and concentric control and stability from multiple surfaces    Ambulation/Gait Ambulation/Gait assistance: Supervision Gait Distance (Feet): 350 Feet x 1 with RW, 10 Feet x 1 without AD Assistive device: Rolling walker (2 wheels) Gait Pattern/deviations: Step-through pattern Gait velocity: decreased     General Gait Details: Good control and stability with the RW including during start/stops and 180 deg turns   Optometrist     Tilt Bed    Modified Rankin (Stroke Patients Only)       Balance Overall balance assessment: Needs assistance   Sitting balance-Leahy Scale: Normal     Standing balance support: During functional activity, Bilateral upper extremity supported Standing balance-Leahy Scale: Good                              Communication Communication Communication: No apparent difficulties  Cognition Arousal: Alert Behavior During Therapy: WFL for tasks assessed/performed   PT - Cognitive impairments: No apparent impairments                         Following commands: Intact      Cueing Cueing Techniques: Verbal cues  Exercises Other Exercises Other Exercises: Log roll training    General Comments  Pertinent Vitals/Pain Pain Assessment Pain Assessment: 0-10 Pain Score: 2  Pain Location: abdomen Pain Descriptors / Indicators: Discomfort Pain Intervention(s): Premedicated before session, Monitored during session    Home Living                          Prior Function            PT Goals (current goals can now be found in the care plan section) Progress towards PT goals: Progressing toward goals    Frequency    Min 1X/week      PT Plan       Co-evaluation              AM-PAC PT 6 Clicks Mobility   Outcome Measure  Help needed turning from your back to your side while in a flat bed without using bedrails?: A Little Help needed moving from lying on your back to sitting on the side of a flat bed without using bedrails?: A Little Help needed moving to and from a bed to a chair (including a wheelchair)?: A Little Help needed standing up from a chair using your arms (e.g., wheelchair or bedside chair)?: A Little Help needed to walk in hospital room?: A Little Help needed climbing 3-5 steps with a railing? : A Little 6 Click Score: 18    End of Session Equipment Utilized During Treatment: Gait belt (belt placed under arms to avoid drains/ileostomy) Activity Tolerance: Patient tolerated treatment well Patient left: in chair;with call bell/phone within reach;with family/visitor present Nurse Communication: Mobility status;Other (comment) (Pt left in chair without chair alarm per pt request, nsg notified) PT Visit Diagnosis: Other abnormalities of gait and mobility (R26.89);Other symptoms and signs involving the nervous system (R29.898)     Time: 9094-9080 PT Time Calculation (min) (ACUTE ONLY): 14 min  Charges:    $Therapeutic Activity: 8-22 mins PT General Charges $$ ACUTE PT VISIT: 1 Visit                     D. Scott Nayah Lukens PT, DPT 09/03/24, 10:32 AM

## 2024-09-03 NOTE — Progress Notes (Addendum)
 " Hospitalist consult PROGRESS NOTE    Yolanda Gray  FMW:[id number] DOB: 12-Oct-1955 DOA: 08/30/2024 PCP: [name], NP    Brief Narrative:   69 y.o. female with medical history significant for COPD not on home oxygen, DM, HTN, chronic smoking, paroxysmal A-fib on Eliquis , polycythemia, obesity who was admitted electively on 08/30/2024 for complicated diverticulitis with prior perforation and pericolic abscess.  Patient had a mild AKI with creatinine of 1.26, blood pressure was low in 60s one-time 62/46,.  Hospitalist service was consulted for evaluation for management of medical problems due to hypotension and AKI.  Patient denies any fever, nausea, vomiting, chills, cough, shortness of breath, palpitations, hematuria, dysuria, dizziness, headache.   Patient is postoperative day one of open sigmoidectomy with diverting loop ileostomy and appendectomy for complicated diverticulitis.  Today as mentioned above patient had episode of hypotension and AKI.  Patient was started on IV fluid 75 cc and then upgraded to 100 cc/h.  Patient had a leukocytosis at 13.2, AKI with creatinine of 1.26 baseline is 0.8.   Assessment & Plan:   Principal Problem:   Diverticulitis of intestine with abscess Active Problems:   COPD (chronic obstructive pulmonary disease) (HCC)   Polycythemia   Tobacco abuse   AKI (acute kidney injury)   T2DM (type 2 diabetes mellitus) (HCC)   69 year old female electively admitted on 08/30/2024 for complicated diverticulitis surgery.  She is status post open sigmoidectomy with diverting loop ileostomy and appendectomy for complicated diverticulitis 08/30/24.   1.  Complicated diverticulitis s/p open sigmoidectomy with diverting loop ileostomy - POD #4 - Patient has ostomy - Management per general surgery   2.  Paroxysmal A-fib/hypertension - Patient received 500 cc of normal saline bolus Plan: Fluids discontinued Metoprolol  restarted Continue amiodarone  200 mg  twice daily x 1 additional days followed by 200 mg daily Cardiology following  3.  AKI Mild, nonoliguric Renal function improving At baseline is 1/23 Plan: Fluids held.  Encouraged p.o. fluid intake   4.  History of diabetes Advanced to heart diet Sugars acceptable Plan: Monitor CBGs AC and at bedtime Continue sliding scale for now If sugars start to climb consider reintroduction of long-acting insulin .  Currently within acceptable range Continue to monitor  5. History of COPD/tobacco abuse/polycythemia No evidence of acute exacerbation Patient on room air Plan: Continue nebs as needed Encourage incentive spirometry use  Thank you for the consult.   Care plan discussed with primary surgical attending TRH service to sign off at this time Thank you for involving us  in the care of your patient Please feel free to reach out with further questions or concerns  DVT prophylaxis: Arixtra  Code Status: Full Family Communication: Family member at bedside 1/21, 1/22, 1/23 Disposition Plan: Per primary General Surgical team Level of care: Med-Surg  Consultants:  Hospitalist Cardiology   Procedures:  open sigmoidectomy with diverting loop ileostomy  Antimicrobials: None   Subjective: Seen and examined.  In bed.  Appears fatigued otherwise stable.  No distress.  No complaints  Objective: Vitals:   09/02/24 1956 09/03/24 0342 09/03/24 0500 09/03/24 0735  BP: (!) 147/56 (!) 171/71  (!) 155/66  Pulse: 85 78  75  Resp: 18 18  17   Temp: (!) 97.4 F (36.3 C) 97.7 F (36.5 C)  97.7 F (36.5 C)  TempSrc:      SpO2: 97% 98%  98%  Weight:   95 kg     Intake/Output Summary (Last 24 hours) at 09/03/2024 1111 Last data filed  at 09/03/2024 1100 Gross per 24 hour  Intake 1200 ml  Output 315 ml  Net 885 ml   Filed Weights   09/03/24 0500  Weight: 95 kg    Examination:  General exam: Fatigued Respiratory system: Lungs clear.  Normal work breathing.  Room  air Cardiovascular system: S1-S2, RRR, no murmurs, no pedal edema Gastrointestinal system: Soft, mild TTP, + ileostomy Central nervous system: Alert and oriented. No focal neurological deficits. Extremities: Symmetric 5 x 5 power. Skin: No rashes, lesions or ulcers Psychiatry: Judgement and insight appear normal. Mood & affect appropriate.     Data Reviewed: I have personally reviewed following labs and imaging studies  CBC: Recent Labs  Lab 08/31/24 0410 09/01/24 0549 09/02/24 0428 09/03/24 0537  WBC 13.2* 14.6* 11.3* 8.0  HGB 9.3* 8.0* 7.7* 7.7*  HCT 31.0* 26.0* 25.3* 25.9*  MCV 88.3 88.1 89.1 88.1  PLT 332 254 254 263   Basic Metabolic Panel: Recent Labs  Lab 08/30/24 1622 08/31/24 0410 09/01/24 0549 09/02/24 0428 09/03/24 0537  NA  --  136 133* 137 139  K  --  4.4 4.3 4.5 4.5  CL  --  101 102 105 105  CO2  --  24 21* 24 25  GLUCOSE  --  124* 128* 146* 130*  BUN  --  16 18 16 12   CREATININE 0.92 1.26* 1.28* 1.01* 0.82  CALCIUM  --  8.4* 8.1* 8.1* 8.4*   GFR: Estimated Creatinine Clearance: 72.9 mL/min (by C-G formula based on SCr of 0.82 mg/dL). Liver Function Tests: No results for input(s): AST, ALT, ALKPHOS, BILITOT, PROT, ALBUMIN in the last 168 hours. No results for input(s): LIPASE, AMYLASE in the last 168 hours. No results for input(s): AMMONIA in the last 168 hours. Coagulation Profile: No results for input(s): INR, PROTIME in the last 168 hours. Cardiac Enzymes: No results for input(s): CKTOTAL, CKMB, CKMBINDEX, TROPONINI in the last 168 hours. BNP (last 3 results) No results for input(s): PROBNP in the last 8760 hours. HbA1C: No results for input(s): HGBA1C in the last 72 hours. CBG: Recent Labs  Lab 09/02/24 1116 09/02/24 1646 09/02/24 2123 09/03/24 0732 09/03/24 1105  GLUCAP 182* 149* 160* 114* 174*   Lipid Profile: No results for input(s): CHOL, HDL, LDLCALC, TRIG, CHOLHDL, LDLDIRECT in the  last 72 hours. Thyroid Function Tests: No results for input(s): TSH, T4TOTAL, FREET4, T3FREE, THYROIDAB in the last 72 hours. Anemia Panel: No results for input(s): VITAMINB12, FOLATE, FERRITIN, TIBC, IRON, RETICCTPCT in the last 72 hours. Sepsis Labs: No results for input(s): PROCALCITON, LATICACIDVEN in the last 168 hours.  No results found for this or any previous visit (from the past 240 hours).       Radiology Studies: No results found.      Scheduled Meds:  amiodarone   200 mg Oral BID   Followed by   NOREEN ON 09/04/2024] amiodarone   200 mg Oral Daily   apixaban   5 mg Oral BID   celecoxib   200 mg Oral BID   feeding supplement (GLUCERNA SHAKE)  237 mL Oral TID BM   gabapentin   300 mg Oral BID   insulin  aspart  0-15 Units Subcutaneous TID WC   metoprolol  succinate  25 mg Oral Daily   multivitamin with minerals  1 tablet Oral Daily   pantoprazole  (PROTONIX ) IV  40 mg Intravenous QHS   Continuous Infusions:   LOS: 4 days     Calvin KATHEE Robson, MD [facility]   If 7PM-7AM, please contact  night-coverage  09/03/2024, 11:11 AM   "

## 2024-09-03 NOTE — TOC Transition Note (Signed)
 Transition of Care [facility]) - Discharge Note   Patient Details  Name: Yolanda Gray MRN: [id number] Date of Birth: 02-May-1956  [facility]) CM/SW Contact:  Corean ONEIDA Haddock, RN Phone Number: 09/03/2024, 2:29 PM   Clinical Narrative:     Patient to dc today Notified by surgery that patient now in agreement to home health  Spoke with patient   Patient states that she may end up staying with her daughter at discharge   Initially she was agreeable to home health. but the name of the company she provided me was the name of an ostomy supply company. I reviewed the [facility] companies that are able to take her. She declined. She states I dont think I need any of that since I have my husband and daughter, if I change my mind ill let you know I let her know if she changes her mind after dc there would be a delay because the order would have be done out patient. She said that's ok I dont need it   MD updated  Family to transport at discharge     Patient Goals and CMS Choice Patient states their goals for this hospitalization and ongoing recovery are:: Wants to go home and have her husband take care of her, no home health          Discharge Placement                       Discharge Plan and Services Additional resources added to the After Visit Summary for     Discharge Planning Services: CM Consult            DME Arranged: N/A         HH Arranged: Refused HH          Social Drivers of Health (SDOH) Interventions SDOH Screenings   Food Insecurity: No Food Insecurity (08/30/2024)  Recent Concern: Food Insecurity - Food Insecurity Present (08/16/2024)   Received from [facility]  Housing: Low Risk (08/30/2024)  Recent Concern: Housing - High Risk (08/16/2024)   Received from [facility]  Transportation Needs: No Transportation Needs (08/30/2024)  Recent Concern: Transportation Needs - Unmet Transportation Needs (07/13/2024)    Received from [facility]  Utilities: Not At Risk (08/30/2024)  Recent Concern: Utilities - At Risk (08/16/2024)   Received from [facility]  Depression [phone]): Low Risk (08/10/2024)  Financial Resource Strain: High Risk (08/16/2024)   Received from [facility]  Physical Activity: Insufficiently Active (08/16/2024)   Received from [facility]  Social Connections: Moderately Integrated (08/30/2024)  Stress: No Stress Concern Present (08/16/2024)   Received from [facility]  Tobacco Use: Medium Risk (08/30/2024)  Health Literacy: Adequate Health Literacy (08/16/2024)   Received from [facility]     Readmission Risk Interventions     No data to display

## 2024-09-03 NOTE — Discharge Instructions (Signed)
" °  Diet: Resume home heart healthy regular diet.   Activity: No heavy lifting >20 pounds (children, pets, laundry, garbage) or strenuous activity until follow-up, but light activity and walking are encouraged. Do not drive or drink alcohol if taking narcotic pain medications.  Wound care: May shower with soapy water  and pat dry (do not rub incisions), but no baths or submerging incision underwater until follow-up. (no swimming) Plan to remove skin staples at follow-up visit. Continue ileostomy care and management as instructed by wound care nurse.  Keep drain site clean and dry.   Medications: Resume all home medications. For mild to moderate pain: acetaminophen  (Tylenol ) or ibuprofen (if no kidney disease). Combining Tylenol  with alcohol can substantially increase your risk of causing liver disease. Narcotic pain medications, if prescribed, can be used for severe pain, though may cause nausea, constipation, and drowsiness. Do not combine Tylenol  and Norco within a 6 hour period as Norco contains Tylenol . If you do not need the narcotic pain medication, you do not need to fill the prescription.  Call office [phone]) at any time if any questions, worsening pain, fevers/chills, bleeding, drainage from incision site, or other concerns.  "

## 2024-09-09 ENCOUNTER — Other Ambulatory Visit: Payer: Self-pay

## 2024-09-13 NOTE — Care Plan (Signed)
" °  Problem: Care Coordination Goal: Seek help from health care provider/other resources as appropriate [facility]) Description: Patient's SmartGoal  Patient will follow up with CC regarding referral to more in my basket program by expected end date.  Outcome: Progressing Note: Patient states they are working on the application needs but due to recent hospitalization and winter weather she has not been able to finish it all yet. Intervention: Connect with support groups or resources as needed Description: CC will follow up by due date to ensure patient has spoke with more in my basket program for SNAP benefit questions.  Note: CC will follow up in a few weeks to see if patient needs any further assistance with SNAP application.    Problem: The 5 Core HRSN/SDOH domains Goal: Food insecurity (DWSDOH) - seek recommended services to reduce SDOH barriers Description: Patient's SmartGoal  Patient will review food pantry resources for [employer] by expected end date.  Outcome: Met/ Completed Flowsheets (Taken 09/13/2024 1212) Was a Food connection made?: Yes- Resources sent to patient Why was a Food connection not made?: (n/a) -- Was the Food connection resolved?: Y Why was the Food connection unresolved?: (n/a) -- Note: Patient stats she has reviewed the food pantry resources sent via mychart. Intervention: Food - assist with tasks to reduce barriers to care Description: CC will follow up by due date to ensure patient has reviewed food pantry resources for [employer].  Note: CC confirmed patient has reviewed food pantry resources  Goal: Utilities [facility]) - seek recommended services to reduce SDOH barriers Description: Patient's SmartGoal  Patient will review resources for utility bill assistance in [employer] [place] by expected end date.  Outcome: Met/ Completed Flowsheets (Taken 09/13/2024 1242) Was a Utility connection made?: Yes - Resources sent to patient Why was a Utility  referral not made?: (n/a) -- Was the Utility connection resolved?: Y Why was the Utility connection unresolved?: (n/a) -- Note: Patient has reviewed utility bill assistance resources.  Intervention: Utilities - assist with tasks to reduce barriers to care Description: CC will follow up by due date to ensure patient has reviewed resources for utility bill assistance in [employer].  Note: CC confirmed patient has reviewed utility bill assistance resources and will utilize when needed.    "

## 2024-10-01 ENCOUNTER — Ambulatory Visit (HOSPITAL_COMMUNITY): Admitting: Nurse Practitioner
